# Patient Record
Sex: Female | Born: 1980 | Race: Black or African American | Hispanic: No | Marital: Single | Smoking: Current every day smoker
Health system: Southern US, Community
[De-identification: ages and names within clinical notes are randomized; demographics above are authoritative.]

## PROBLEM LIST (undated history)

## (undated) DIAGNOSIS — G43909 Migraine, unspecified, not intractable, without status migrainosus: Secondary | ICD-10-CM

## (undated) DIAGNOSIS — J189 Pneumonia, unspecified organism: Secondary | ICD-10-CM

## (undated) DIAGNOSIS — F32A Depression, unspecified: Secondary | ICD-10-CM

## (undated) DIAGNOSIS — C801 Malignant (primary) neoplasm, unspecified: Secondary | ICD-10-CM

## (undated) DIAGNOSIS — F419 Anxiety disorder, unspecified: Secondary | ICD-10-CM

## (undated) DIAGNOSIS — I10 Essential (primary) hypertension: Secondary | ICD-10-CM

## (undated) DIAGNOSIS — L42 Pityriasis rosea: Secondary | ICD-10-CM

## (undated) HISTORY — DX: Anxiety disorder, unspecified: F41.9

## (undated) HISTORY — PX: OTHER SURGICAL HISTORY: SHX169

## (undated) HISTORY — PX: TUBAL LIGATION: SHX77

## (undated) HISTORY — PX: ABDOMINAL HYSTERECTOMY: SHX81

## (undated) HISTORY — PX: UPPER GI ENDOSCOPY: SHX6162

## (undated) HISTORY — DX: Depression, unspecified: F32.A

---

## 2009-08-06 DIAGNOSIS — M5020 Other cervical disc displacement, unspecified cervical region: Secondary | ICD-10-CM

## 2009-08-06 HISTORY — DX: Other cervical disc displacement, unspecified cervical region: M50.20

## 2013-03-04 ENCOUNTER — Emergency Department (HOSPITAL_COMMUNITY)
Admission: EM | Admit: 2013-03-04 | Discharge: 2013-03-04 | Disposition: A | Discharge status: Home / Self Care | Payer: Medicaid Other | Attending: Emergency Medicine | Admitting: Emergency Medicine

## 2013-03-04 ENCOUNTER — Encounter (HOSPITAL_COMMUNITY): Payer: Self-pay | Admitting: Emergency Medicine

## 2013-03-04 DIAGNOSIS — F17200 Nicotine dependence, unspecified, uncomplicated: Secondary | ICD-10-CM | POA: Insufficient documentation

## 2013-03-04 DIAGNOSIS — Z880 Allergy status to penicillin: Secondary | ICD-10-CM | POA: Insufficient documentation

## 2013-03-04 DIAGNOSIS — Z8679 Personal history of other diseases of the circulatory system: Secondary | ICD-10-CM | POA: Insufficient documentation

## 2013-03-04 DIAGNOSIS — T6391XA Toxic effect of contact with unspecified venomous animal, accidental (unintentional), initial encounter: Secondary | ICD-10-CM | POA: Insufficient documentation

## 2013-03-04 DIAGNOSIS — Y929 Unspecified place or not applicable: Secondary | ICD-10-CM | POA: Insufficient documentation

## 2013-03-04 DIAGNOSIS — I10 Essential (primary) hypertension: Secondary | ICD-10-CM | POA: Insufficient documentation

## 2013-03-04 DIAGNOSIS — R21 Rash and other nonspecific skin eruption: Secondary | ICD-10-CM | POA: Insufficient documentation

## 2013-03-04 DIAGNOSIS — Y9389 Activity, other specified: Secondary | ICD-10-CM | POA: Insufficient documentation

## 2013-03-04 DIAGNOSIS — T63461A Toxic effect of venom of wasps, accidental (unintentional), initial encounter: Secondary | ICD-10-CM | POA: Insufficient documentation

## 2013-03-04 HISTORY — DX: Essential (primary) hypertension: I10

## 2013-03-04 HISTORY — DX: Migraine, unspecified, not intractable, without status migrainosus: G43.909

## 2013-03-04 NOTE — ED Notes (Signed)
Pt reports being stung in right upper arm by a bee around 1600 this afternoon. Took 2 benadryl tablets soon after being stung. Pt states swelling has gone down somewhat since taking the benadryl but that arm is still inflamed and painful. Right upper arm swollen, painful to the touch. Pt rates pain 8/10 at this time.

## 2013-03-04 NOTE — ED Notes (Signed)
PT. REPORTS RIGHT UPPER ARM PAIN / SWELLING - STUNG BY A BEE THIS AFTERNOON , RESPIRATIONS UNLABORED / AIRWAY INTACT , PT. TOOK 2 BENADRYL AND 2 ALEEVE TABS WITH NO RELIEF.

## 2013-03-04 NOTE — ED Provider Notes (Signed)
CSN: [id number]     Arrival date & time 03/04/13  1935 History  This chart was scribed for Dierdre Forth, PA-C, working with Loren Racer, MD, by Ardelia Mems ED Scribe. This patient was seen in room TR06C/TR06C and the patient's care was started at 8:57 PM.   First MD Initiated Contact with Patient 03/04/13 2055     Chief Complaint  Patient presents with  . Insect Bite    The history is provided by the patient. No language interpreter was used.   HPI Comments: Regina Cantu is a 32 y.o. female with a history of hypertension who presents to the Emergency Department complaining of right upper arm swelling with associated constant, mild pain onset after a bee sting that occurred about 5 hours ago. She states that she took 2 benadryl, 2 aleve and applied ice with moderate relief. She states that she maintains tenderness in her arm, and she states that her swelling has been gradually improving. She states that as a child she was stung by a bee on the hand, and she states that her only symptom was hand swelling. She denies rash anywhere else on her body, trouble swallowing, SOB, wheezing or any other symptoms. She is a current every day smoker and an occasional alcohol user.  PCP- Dr. Jovita Gamma   Past Medical History  Diagnosis Date  . Hypertension   . Migraine    Past Surgical History  Procedure Laterality Date  . Abdominal hysterectomy    . Ovarian cancer     No family history on file. History  Substance Use Topics  . Smoking status: Current Every Day Smoker  . Smokeless tobacco: Not on file  . Alcohol Use: Yes   OB History   Grav Para Term Preterm Abortions TAB SAB Ect Mult Living                 Review of Systems  Constitutional: Negative for fever, diaphoresis, appetite change, fatigue and unexpected weight change.  HENT: Negative for mouth sores, trouble swallowing and neck stiffness.   Eyes: Negative for visual disturbance.  Respiratory: Negative  for cough, chest tightness, shortness of breath and wheezing.   Cardiovascular: Negative for chest pain.  Gastrointestinal: Negative for nausea, vomiting, abdominal pain, diarrhea and constipation.  Endocrine: Negative for polydipsia, polyphagia and polyuria.  Genitourinary: Negative for dysuria, urgency, frequency and hematuria.  Musculoskeletal: Negative for back pain.  Skin: Positive for rash (localized, right arm).  Allergic/Immunologic: Negative for immunocompromised state.  Neurological: Negative for syncope, light-headedness and headaches.  Hematological: Does not bruise/bleed easily.  Psychiatric/Behavioral: Negative for sleep disturbance. The patient is not nervous/anxious.   All other systems reviewed and are negative.    Allergies  Penicillins  Home Medications   Current Outpatient Rx  Name  Route  Sig  Dispense  Refill  . diphenhydrAMINE (BENADRYL) 25 MG tablet   Oral   Take 50 mg by mouth every 6 (six) hours as needed for itching.         . naproxen sodium (ANAPROX) 220 MG tablet   Oral   Take 440 mg by mouth as needed (pain).          Triage Vitals: BP 146/98  Pulse 94  Temp(Src) 98.4 F (36.9 C) (Oral)  Resp 14  SpO2 97%  Physical Exam  Nursing note and vitals reviewed. Constitutional: She appears well-developed and well-nourished. No distress.  Awake, alert, nontoxic appearance  HENT:  Head: Normocephalic and atraumatic.  Mouth/Throat: Oropharynx  is clear and moist. No oropharyngeal exudate.  No swelling of the oropharynx  Eyes: Conjunctivae are normal. No scleral icterus.  Neck: Normal range of motion. Neck supple.  Cardiovascular: Normal rate, regular rhythm, S1 normal, S2 normal, normal heart sounds and intact distal pulses.   Pulses:      Radial pulses are 2+ on the right side, and 2+ on the left side.  Capillary refill < 3 sec No tachycardia  Pulmonary/Chest: Effort normal and breath sounds normal. No respiratory distress. She has no  decreased breath sounds. She has no wheezes. She has no rhonchi. She has no rales. She exhibits no tenderness.  No stridor.  Abdominal: Soft. Bowel sounds are normal. She exhibits no mass. There is no tenderness. There is no rebound and no guarding.  Musculoskeletal: Normal range of motion. She exhibits no edema.  Neurological: She is alert.  Speech is clear and goal oriented Moves extremities without ataxia  Skin: Skin is warm and dry. She is not diaphoretic.  2 inch x 2 inch area of erythema, swelling and tenderness to palpation of the distal right upper arm, with a puncture wound in the center. No stinger is retained. No rash or hives noted on other portions of the body.  Psychiatric: She has a normal mood and affect.    ED Course   Procedures (including critical care time)  DIAGNOSTIC STUDIES: Oxygen Saturation is 97% on RA, normal by my interpretation.    COORDINATION OF CARE: 9:14 PM- Pt advised of plan for treatment and pt agrees.    Labs Reviewed - No data to display  No results found.  1. Insect sting, initial encounter     MDM  Areal Omer Jack presents after bee sting.  Pt with only local reaction and without systemic symptoms (including wheezing, hypotension, stridor, tachycardia).  Pt already administered home benadryl with moderate relief.  Patient is hemodynamically stable, in no respiratory distress, and denies the feeling of throat closing. Pt has been advised to take OTC benadryl & return to the ED if they have a mod-severe allergic rxn (s/s including throat closing, difficulty breathing, swelling of lips face or tongue). Pt is to follow up with their PCP. Pt is agreeable with plan & verbalizes understanding.   Dahlia Client Julian Askin, PA-C 03/04/13 2221

## 2013-03-05 NOTE — ED Provider Notes (Signed)
Medical screening examination/treatment/procedure(s) were performed by non-physician practitioner and as supervising physician I was immediately available for consultation/collaboration.   Jordan Caraveo, MD 03/05/13 1753 

## 2017-01-06 ENCOUNTER — Encounter (HOSPITAL_COMMUNITY): Payer: Self-pay | Admitting: Emergency Medicine

## 2017-01-06 DIAGNOSIS — F17200 Nicotine dependence, unspecified, uncomplicated: Secondary | ICD-10-CM | POA: Insufficient documentation

## 2017-01-06 DIAGNOSIS — Y939 Activity, unspecified: Secondary | ICD-10-CM | POA: Insufficient documentation

## 2017-01-06 DIAGNOSIS — I10 Essential (primary) hypertension: Secondary | ICD-10-CM | POA: Insufficient documentation

## 2017-01-06 DIAGNOSIS — S29011A Strain of muscle and tendon of front wall of thorax, initial encounter: Secondary | ICD-10-CM | POA: Insufficient documentation

## 2017-01-06 DIAGNOSIS — Y999 Unspecified external cause status: Secondary | ICD-10-CM | POA: Insufficient documentation

## 2017-01-06 DIAGNOSIS — Y33XXXA Other specified events, undetermined intent, initial encounter: Secondary | ICD-10-CM | POA: Insufficient documentation

## 2017-01-06 DIAGNOSIS — Y929 Unspecified place or not applicable: Secondary | ICD-10-CM | POA: Insufficient documentation

## 2017-01-06 NOTE — ED Triage Notes (Signed)
Pt reports chest/rib pain for over a week.  Reports it sometime hurts when she takes a deep breath or moves but denies any sob, dizziness or fevers.

## 2017-01-07 ENCOUNTER — Emergency Department (HOSPITAL_COMMUNITY)
Admission: EM | Admit: 2017-01-07 | Discharge: 2017-01-07 | Disposition: A | Discharge status: Home / Self Care | Payer: Medicaid Other | Attending: Emergency Medicine | Admitting: Emergency Medicine

## 2017-01-07 ENCOUNTER — Encounter (HOSPITAL_COMMUNITY): Payer: Self-pay | Admitting: Emergency Medicine

## 2017-01-07 ENCOUNTER — Emergency Department (HOSPITAL_COMMUNITY): Payer: Medicaid Other

## 2017-01-07 DIAGNOSIS — S29011A Strain of muscle and tendon of front wall of thorax, initial encounter: Secondary | ICD-10-CM

## 2017-01-07 HISTORY — DX: Pityriasis rosea: L42

## 2017-01-07 LAB — CBC
HCT: 41 % (ref 36.0–46.0)
Hemoglobin: 13.6 g/dL (ref 12.0–15.0)
MCH: 27.9 pg (ref 26.0–34.0)
MCHC: 33.2 g/dL (ref 30.0–36.0)
MCV: 84.2 fL (ref 78.0–100.0)
PLATELETS: 378 10*3/uL (ref 150–400)
RBC: 4.87 MIL/uL (ref 3.87–5.11)
RDW: 12.5 % (ref 11.5–15.5)
WBC: 9.3 10*3/uL (ref 4.0–10.5)

## 2017-01-07 LAB — BASIC METABOLIC PANEL
Anion gap: 9 (ref 5–15)
BUN: 19 mg/dL (ref 6–20)
CALCIUM: 9.1 mg/dL (ref 8.9–10.3)
CHLORIDE: 104 mmol/L (ref 101–111)
CO2: 23 mmol/L (ref 22–32)
Creatinine, Ser: 0.98 mg/dL (ref 0.44–1.00)
GFR calc Af Amer: >60 mL/min (ref >60)
GFR calc non Af Amer: >60 mL/min (ref >60)
Glucose, Bld: 139 mg/dL — ABNORMAL HIGH (ref 65–99)
Potassium: 3.6 mmol/L (ref 3.5–5.1)
SODIUM: 136 mmol/L (ref 135–145)

## 2017-01-07 LAB — I-STAT TROPONIN, ED: TROPONIN I, POC: 0 ng/mL (ref 0.00–0.08)

## 2017-01-07 MED ORDER — METHOCARBAMOL 500 MG PO TABS
750.0000 mg | ORAL_TABLET | Freq: Once | ORAL | Status: AC
  Administered 2017-01-07: 750 mg via ORAL
  Filled 2017-01-07: qty 2

## 2017-01-07 MED ORDER — METHOCARBAMOL 500 MG PO TABS: 500.0000 mg | ORAL_TABLET | Freq: Two times a day (BID) | ORAL | 0 refills | Status: DC

## 2017-01-07 MED ORDER — TRAMADOL HCL 50 MG PO TABS
50.0000 mg | ORAL_TABLET | Freq: Once | ORAL | Status: AC
  Administered 2017-01-07: 50 mg via ORAL
  Filled 2017-01-07: qty 1

## 2017-01-07 MED ORDER — TRAMADOL HCL 50 MG PO TABS: 50.0000 mg | ORAL_TABLET | Freq: Four times a day (QID) | ORAL | 0 refills | Status: DC | PRN

## 2017-01-07 NOTE — Discharge Instructions (Signed)
You have been given prescriptions for muscle relaxer and pain relief.  Please take these as directed.  If you continue to have pain or you develop new or worsening symptoms please return for further evaluation is needed

## 2017-01-07 NOTE — ED Provider Notes (Signed)
[facility] DEPT Provider Note   CSN: [id number] Arrival date & time: 01/06/17  2310     History   Chief Complaint Chief Complaint  Patient presents with  . Chest Pain    HPI Regina Cantu is a 36 y.o. female.  The past 2 weeks.  Patient has had right-sided rib pain.  She does not remember a distinct injury.  She denies any fever, cough, recent illness.  She said certain movements increase the pain.  She's been taking Advil and using salon pot patches without any relief.  She has not seen her doctor during this two-week period. She works as a Chief Strategy Officer.       Past Medical History:  Diagnosis Date  . Hypertension   . Migraine   . Pityriasis rosea     There are no active problems to display for this patient.   Past Surgical History:  Procedure Laterality Date  . ABDOMINAL HYSTERECTOMY    . OVARIAN CANCER      OB History    No data available       Home Medications    Prior to Admission medications   Not on File    Family History No family history on file.  Social History Social History  Substance Use Topics  . Smoking status: Current Every Day Smoker  . Smokeless tobacco: Never Used  . Alcohol use Yes     Allergies   Penicillins   Review of Systems Review of Systems  Constitutional: Negative for fever.  Respiratory: Negative for cough and shortness of breath.   Cardiovascular: Positive for chest pain.  Gastrointestinal: Negative for abdominal pain and nausea.  Skin: Negative for rash and wound.  All other systems reviewed and are negative.    Physical Exam Updated Vital Signs BP (!) 150/88 (BP Location: Right Arm)   Pulse 78   Temp 98.9 F (37.2 C) (Oral)   Resp 18   SpO2 99%   Physical Exam  Constitutional: She appears well-developed and well-nourished.  HENT:  Head: Normocephalic.  Eyes: Pupils are equal, round, and reactive to light.  Neck: Normal range of motion.  Cardiovascular: Normal rate.     Pulmonary/Chest: Effort normal and breath sounds normal. She has no wheezes. She has no rales. Chest wall is not dull to percussion. She exhibits tenderness. She exhibits no crepitus, no edema and no swelling.    Nursing note and vitals reviewed.    ED Treatments / Results  Labs (all labs ordered are listed, but only abnormal results are displayed) Labs Reviewed  BASIC METABOLIC PANEL - Abnormal; Notable for the following:       Result Value   Glucose, Bld 139 (*)    All other components within normal limits  CBC  I-STAT TROPOININ, ED    EKG  EKG Interpretation None       Radiology Dg Chest 2 View  Result Date: 01/07/2017 CLINICAL DATA:  Right-sided chest pain tonight. EXAM: CHEST  2 VIEW COMPARISON:  None. FINDINGS: The cardiomediastinal contours are normal. The lungs are clear. Pulmonary vasculature is normal. No consolidation, pleural effusion, or pneumothorax. No acute osseous abnormalities are seen. IMPRESSION: No acute abnormality. Electronically Signed   By: Jeb Levering M.D.   On: 01/07/2017 00:45    Procedures Procedures (including critical care time)  Medications Ordered in ED Medications  methocarbamol (ROBAXIN) tablet 750 mg (not administered)  traMADol (ULTRAM) tablet 50 mg (not administered)     Initial Impression / Assessment  and Plan / ED Course  I have reviewed the triage vital signs and the nursing notes.  Pertinent labs & imaging results that were available during my care of the patient were reviewed by me and considered in my medical decision making (see chart for details).    Review of patient's labs, EKG and chest x-ray within normal parameters feel this patient's discomfort is muscular in nature.  She will be started on Robaxin and Ultram for the next several days.    Final Clinical Impressions(s) / ED Diagnoses   Final diagnoses:  None    New Prescriptions New Prescriptions   No medications on file     Junius Creamer,  NP 01/07/17 2767    Merryl Hacker, MD 01/07/17 [phone]

## 2017-01-07 NOTE — ED Notes (Signed)
Pt c/o R posterior rib area pain radiating to R axillary area x 2 weeks. Pt reports having a "bump" to area that husband has attempted to clean with alcohol.  Indurated area noted with a few small raised areas. Small dry area noted to center, no drainage noted. Pt reports severe pain, no hx of chicken pox, no fever.

## 2018-04-17 IMAGING — CR DG CHEST 2V
2 series · 2 of 2 positions shown · non-contrast
Comparison: None.

CLINICAL DATA: Right-sided chest pain tonight.

EXAM:
CHEST  2 VIEW

[chest pa]
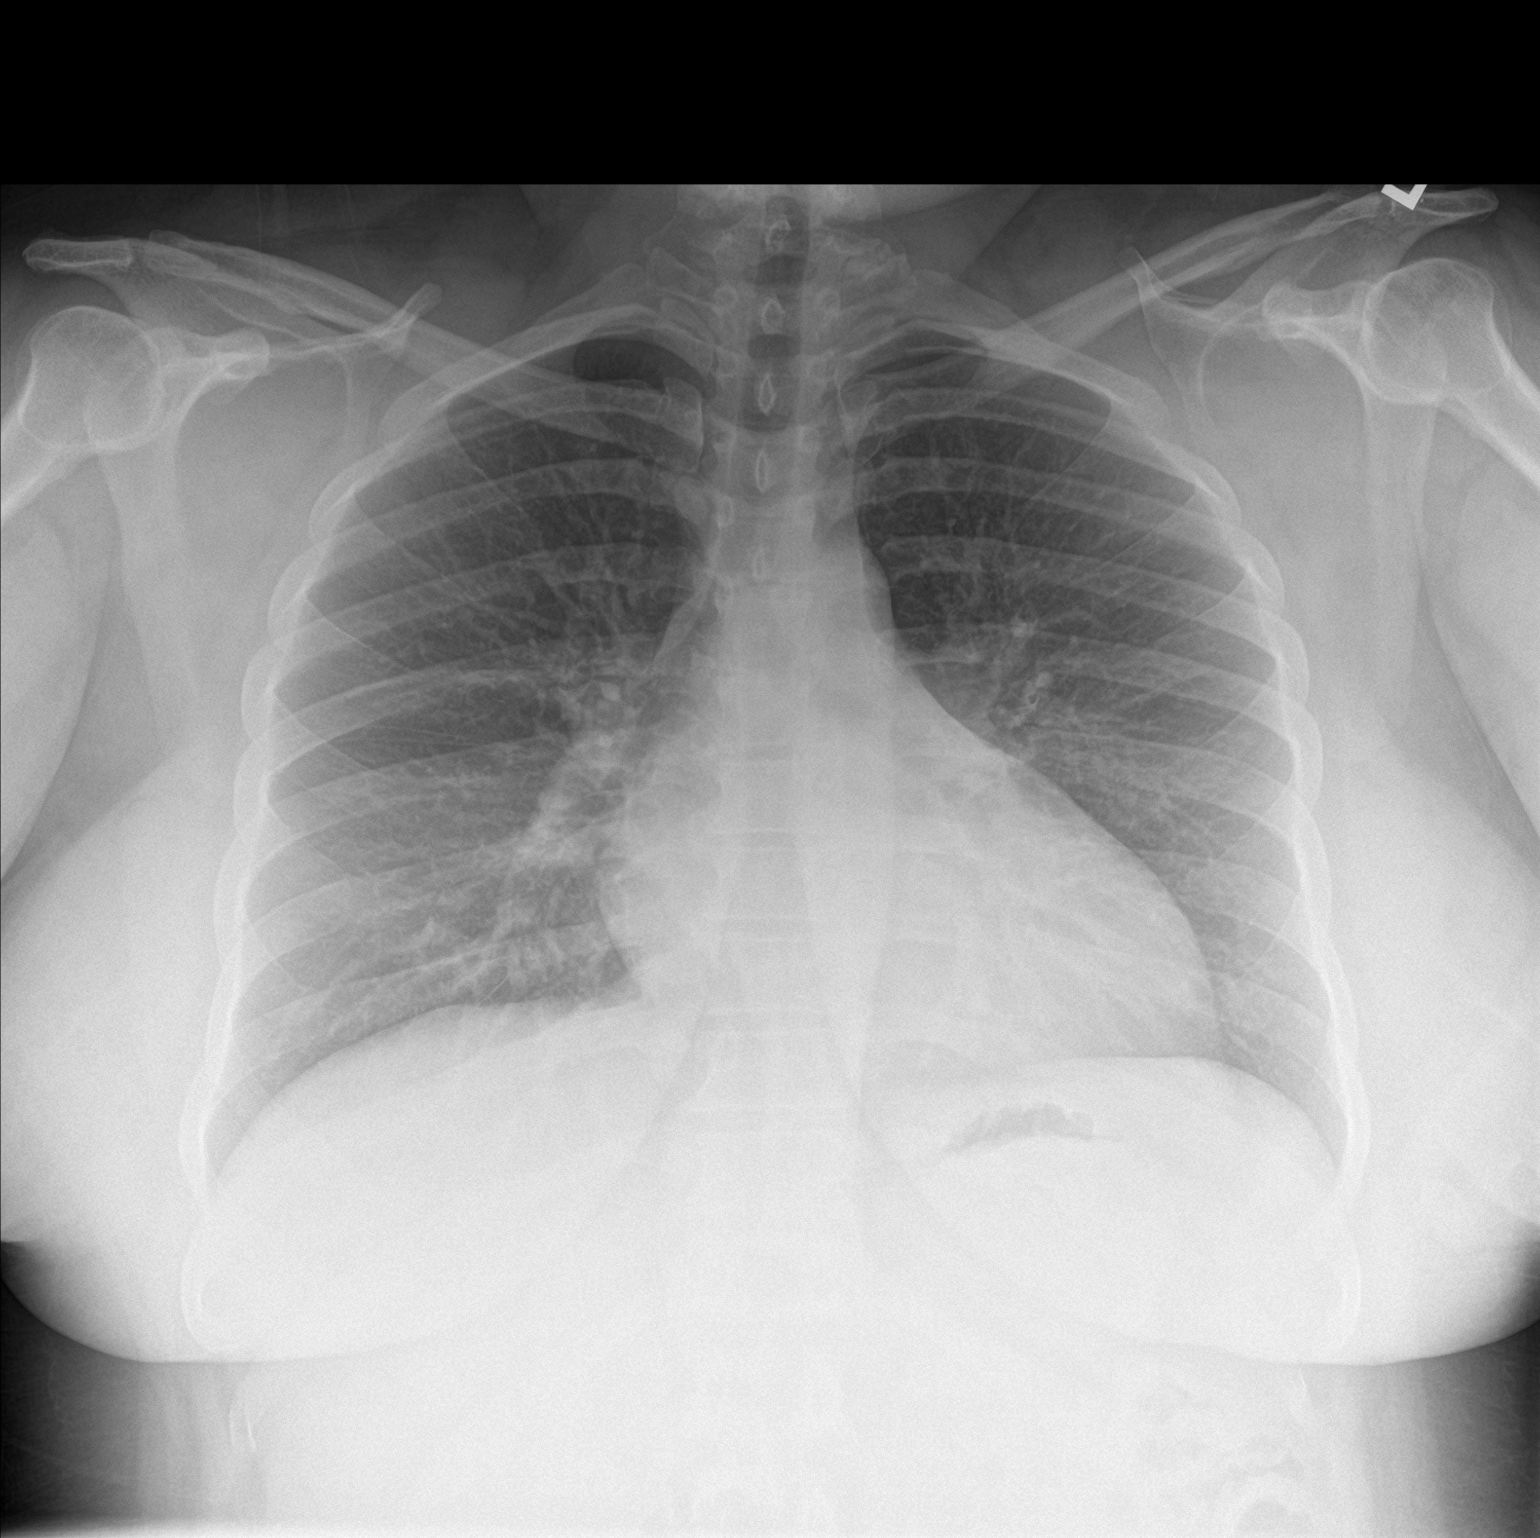

[chest lat]
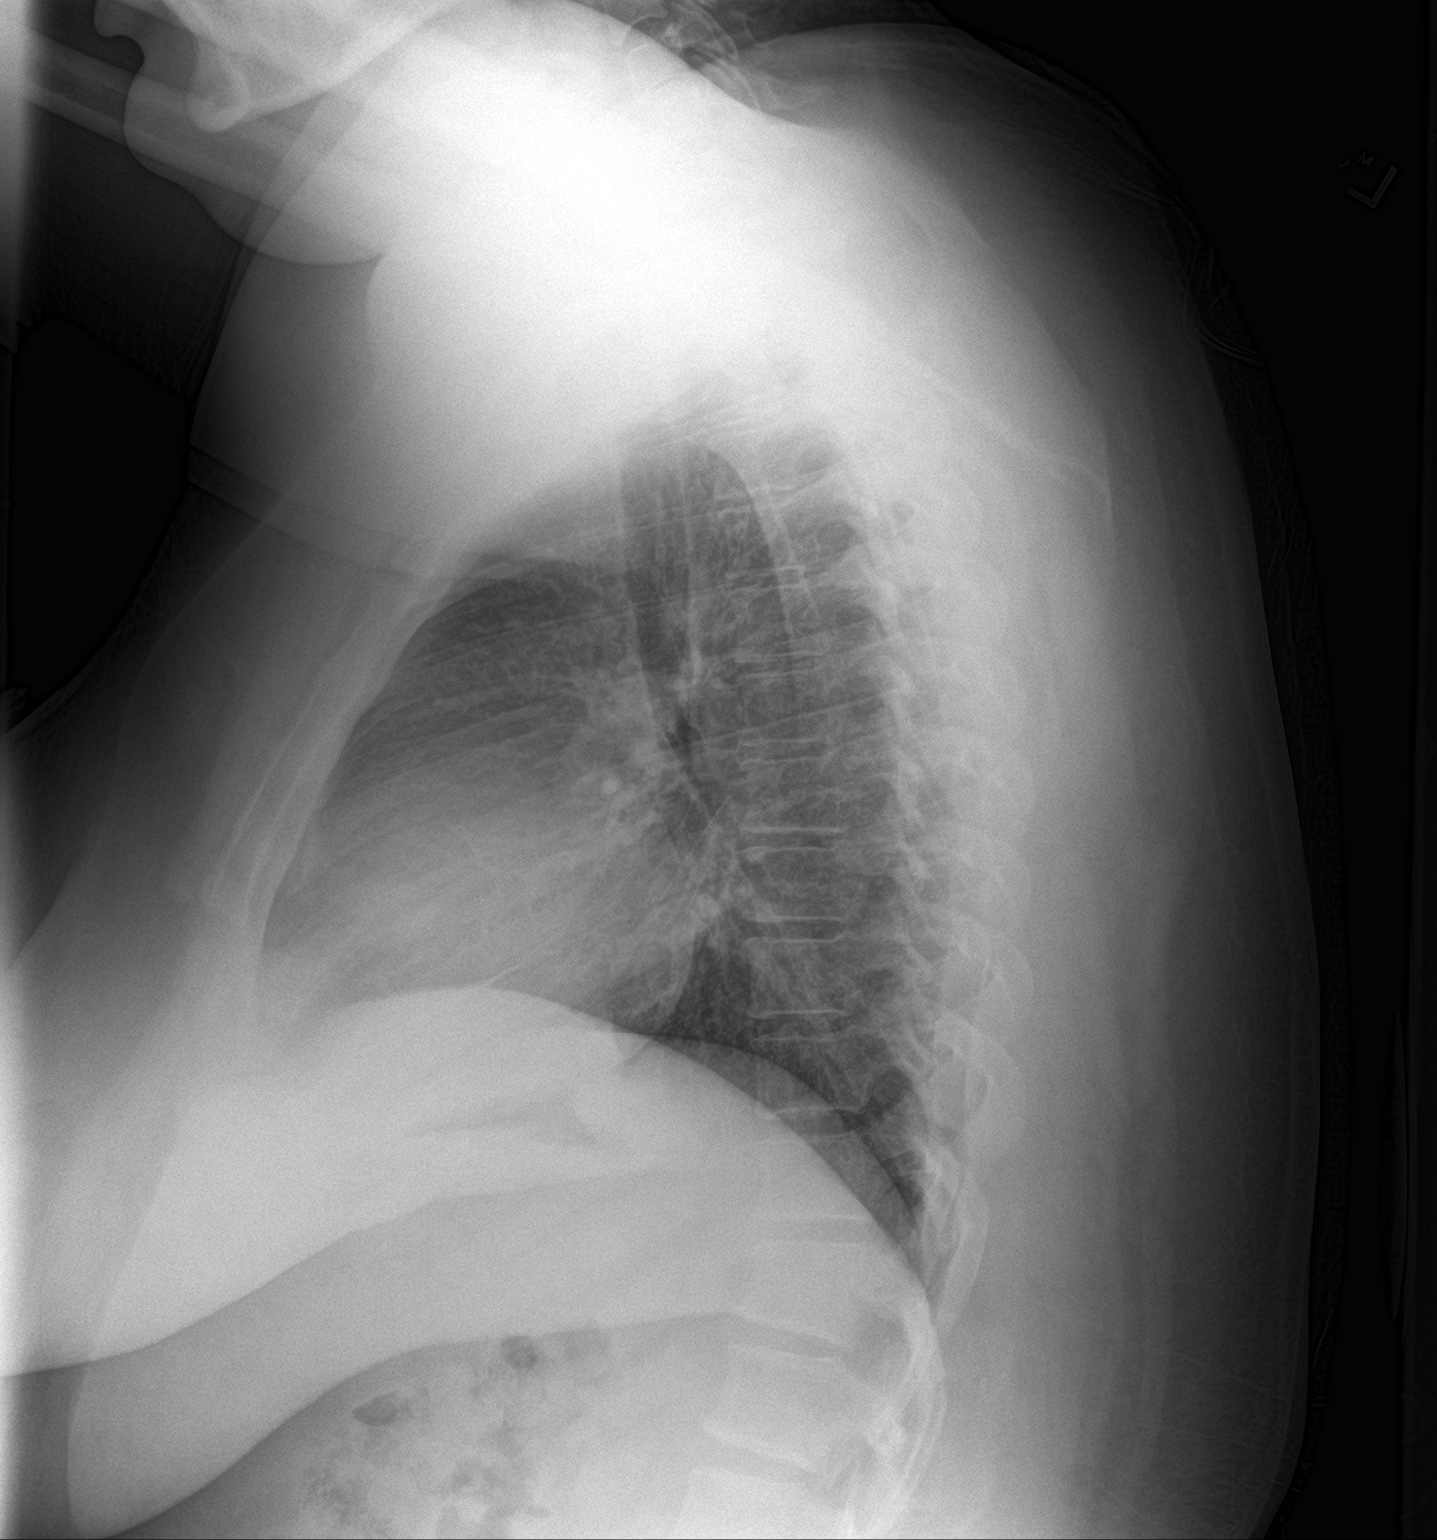

[2 of 2 positions shown; findings below may reference images not displayed]

FINDINGS: The cardiomediastinal contours are normal. The lungs are clear.
Pulmonary vasculature is normal. No consolidation, pleural effusion,
or pneumothorax. No acute osseous abnormalities are seen.
IMPRESSION: No acute abnormality.

## 2018-09-16 ENCOUNTER — Encounter (HOSPITAL_COMMUNITY): Payer: Self-pay

## 2018-09-16 ENCOUNTER — Ambulatory Visit (HOSPITAL_COMMUNITY)
Admission: EM | Admit: 2018-09-16 | Discharge: 2018-09-16 | Disposition: A | Discharge status: Home / Self Care | Payer: Self-pay | Attending: Family Medicine | Admitting: Family Medicine

## 2018-09-16 ENCOUNTER — Other Ambulatory Visit: Payer: Self-pay

## 2018-09-16 DIAGNOSIS — L0291 Cutaneous abscess, unspecified: Secondary | ICD-10-CM

## 2018-09-16 MED ORDER — SULFAMETHOXAZOLE-TRIMETHOPRIM 800-160 MG PO TABS: 1.0000 | ORAL_TABLET | Freq: Two times a day (BID) | ORAL | 0 refills | Status: DC

## 2018-09-16 MED ORDER — LIDOCAINE HCL 2 % IJ SOLN
INTRAMUSCULAR | Status: AC
  Filled 2018-09-16: qty 20

## 2018-09-16 MED ORDER — HYDROCODONE-ACETAMINOPHEN 5-325 MG PO TABS: 1.0000 | ORAL_TABLET | Freq: Four times a day (QID) | ORAL | 0 refills | Status: DC | PRN

## 2018-09-16 NOTE — ED Triage Notes (Signed)
Pt cc she has a abscess on her inner thigh near her buttocks. X 4 days

## 2018-09-16 NOTE — Discharge Instructions (Signed)
You have had an abscess drained today and you may have had packing placed in the wound to help the abscess continue to drain at home. If packing was placed, please do not remove it. You may shower with the packing in place. Let the soapy water clean your wound. Do not scrub it. Keep your wound covered. Follow up at the Urgent Care in 2 days for a wound check and/or packing removal. Return to the Urgent Care immediately if you develop any of the following symptoms: fever, Increased redness or swelling around where your abscess was, increased pain, or generalized weakness or vomiting.  Be aware, pain medications may cause drowsiness. Please do not drive, operate heavy machinery or make important decisions while on this medication, it can cloud your judgement.

## 2018-09-17 NOTE — ED Provider Notes (Signed)
[facility]   [id number] 09/16/18 Arrival Time: 7425  ASSESSMENT & PLAN:  1. Abscess     Incision and Drainage Procedure Note  Anesthesia: 2% plain lidocaine  Procedure Details  The procedure, risks and complications have been discussed in detail (including but not limited to pain and bleeding) with the patient.  The skin induration was prepped and draped in the usual fashion. After adequate local anesthesia, I&D with a #11 blade was performed on the right inner upper thigh near buttock with copious purulent drainage. Abscess cavity explored and evacuated. Loculations broken up with a curved hemostat as best as possible given patient discomfort.  EBL: minimal Drains: none Packing: placed Condition: Tolerated procedure well Complications: none.  Meds ordered this encounter  Medications  . HYDROcodone-acetaminophen (NORCO/VICODIN) 5-325 MG tablet    Sig: Take 1 tablet by mouth every 6 (six) hours as needed for moderate pain or severe pain.    Dispense:  10 tablet    Refill:  0  . sulfamethoxazole-trimethoprim (BACTRIM DS,SEPTRA DS) 800-160 MG tablet    Sig: Take 1 tablet by mouth 2 (two) times daily for 7 days.    Dispense:  14 tablet    Refill:  0    Wound care instructions discussed and given in written format. To return in 48 hours for wound check.  Finish all antibiotics. OTC analgesics as needed.  [place] Controlled Substances Registry consulted for this patient. I feel the risk/benefit ratio today is favorable for proceeding with this prescription for a controlled substance. Medication sedation precautions given.  Reviewed expectations re: course of current medical issues. Questions answered. Outlined signs and symptoms indicating need for more acute intervention. Patient verbalized understanding. After Visit Summary given.   SUBJECTIVE:  Lamica Lakaya Cantu is a 38 y.o. female who presents with a possible infection of her inner upper right thigh. Onset  gradual, approximately several days ago without active drainage and without active bleeding. Symptoms have gradually worsened since beginning. Fever: absent. OTC/home treatment: none. Pain with ambulation.  ROS: As per HPI.  OBJECTIVE:  Vitals:   09/16/18 1723 09/16/18 1727  BP: (!) 154/87   Pulse: 100   Resp: 18   Temp: 97.8 F (36.6 C)   TempSrc: Tympanic   SpO2: 100%   Weight:  113.4 kg    General appearance: alert; no distress Skin: 3x4 cm induration of her right inner upper thigh at buttock with surrounding skin thickening; very tender to touch; no active drainage or bleeding FROM of RLE at hip. Normal gait. Psychological: alert and cooperative; normal mood and affect  Allergies  Allergen Reactions  . Penicillins Hives, Nausea And Vomiting and Rash    Past Medical History:  Diagnosis Date  . Hypertension   . Migraine   . Pityriasis rosea    Social History   Socioeconomic History  . Marital status: Single    Spouse name: Not on file  . Number of children: Not on file  . Years of education: Not on file  . Highest education level: Not on file  Occupational History  . Not on file  Social Needs  . Financial resource strain: Not on file  . Food insecurity:    Worry: Not on file    Inability: Not on file  . Transportation needs:    Medical: Not on file    Non-medical: Not on file  Tobacco Use  . Smoking status: Current Every Day Smoker  . Smokeless tobacco: Never Used  Substance and Sexual  Activity  . Alcohol use: Yes  . Drug use: No  . Sexual activity: Not on file  Lifestyle  . Physical activity:    Days per week: Not on file    Minutes per session: Not on file  . Stress: Not on file  Relationships  . Social connections:    Talks on phone: Not on file    Gets together: Not on file    Attends religious service: Not on file    Active member of club or organization: Not on file    Attends meetings of clubs or organizations: Not on file     Relationship status: Not on file  Other Topics Concern  . Not on file  Social History Narrative  . Not on file   History reviewed. No pertinent family history. Past Surgical History:  Procedure Laterality Date  . ABDOMINAL HYSTERECTOMY    . OVARIAN CANCER             Vanessa Kick, MD 09/17/18 [phone]

## 2018-09-18 ENCOUNTER — Encounter (HOSPITAL_COMMUNITY): Payer: Self-pay | Admitting: Emergency Medicine

## 2018-09-18 ENCOUNTER — Other Ambulatory Visit: Payer: Self-pay

## 2018-09-18 ENCOUNTER — Ambulatory Visit (HOSPITAL_COMMUNITY)
Admission: EM | Admit: 2018-09-18 | Discharge: 2018-09-18 | Disposition: A | Discharge status: Home / Self Care | Payer: Self-pay | Attending: Family Medicine | Admitting: Family Medicine

## 2018-09-18 DIAGNOSIS — L02214 Cutaneous abscess of groin: Secondary | ICD-10-CM

## 2018-09-18 MED ORDER — DOXYCYCLINE HYCLATE 100 MG PO TABS: 100.0000 mg | ORAL_TABLET | Freq: Two times a day (BID) | ORAL | 0 refills | Status: DC

## 2018-09-18 NOTE — Discharge Instructions (Addendum)
Wash/soak in hot soapy water as much as possible  Switch antibiotic  Return if pain not better by Saturday

## 2018-09-18 NOTE — ED Provider Notes (Signed)
[facility]    CSN: [id number] Arrival date & time: 09/18/18  1537     History   Chief Complaint Chief Complaint  Patient presents with  . Follow-up    HPI Regina Cantu is a 38 y.o. female.   This is a 38 year old woman who had an abscess drained in her groin 2 days ago.  She still having quite a bit of pain despite having taken her antibiotics as directed.     Past Medical History:  Diagnosis Date  . Hypertension   . Migraine   . Pityriasis rosea     There are no active problems to display for this patient.   Past Surgical History:  Procedure Laterality Date  . ABDOMINAL HYSTERECTOMY    . OVARIAN CANCER      OB History   No obstetric history on file.      Home Medications    Prior to Admission medications   Medication Sig Start Date End Date Taking? Authorizing Provider  doxycycline (VIBRA-TABS) 100 MG tablet Take 1 tablet (100 mg total) by mouth 2 (two) times daily. 09/18/18   Robyn Haber, MD    Family History History reviewed. No pertinent family history.  Social History Social History   Tobacco Use  . Smoking status: Current Every Day Smoker  . Smokeless tobacco: Never Used  Substance Use Topics  . Alcohol use: Yes  . Drug use: No     Allergies   Penicillins   Review of Systems Review of Systems   Physical Exam Triage Vital Signs ED Triage Vitals  Enc Vitals Group     BP 09/18/18 1647 (!) 120/51     Pulse Rate 09/18/18 1647 93     Resp 09/18/18 1647 18     Temp 09/18/18 1647 98 F (36.7 C)     Temp Source 09/18/18 1647 Temporal     SpO2 09/18/18 1647 99 %     Weight --      Height --      Head Circumference --      Peak Flow --      Pain Score 09/18/18 1645 7     Pain Loc --      Pain Edu? --      Excl. in [place]? --    No data found.  Updated Vital Signs BP (!) 120/51 (BP Location: Right Arm)   Pulse 93   Temp 98 F (36.7 C) (Temporal)   Resp 18   SpO2 99%    Physical Exam Vitals signs  and nursing note reviewed.  Constitutional:      Appearance: Normal appearance. She is obese. She is not ill-appearing.  Pulmonary:     Effort: Pulmonary effort is normal.  Skin:    Comments: 2 cm area of induration anterior to I&D site.  Exquisitely tender.  Open stab wound.  No drainage.  Neurological:     Mental Status: She is alert.      UC Treatments / Results  Labs (all labs ordered are listed, but only abnormal results are displayed) Labs Reviewed - No data to display  EKG None  Radiology No results found.  Procedures Procedures (including critical care time)  Medications Ordered in UC Medications - No data to display  Initial Impression / Assessment and Plan / UC Course  I have reviewed the triage vital signs and the nursing notes.  Pertinent labs & imaging results that were available during my care of the patient were reviewed  by me and considered in my medical decision making (see chart for details).    Final Clinical Impressions(s) / UC Diagnoses   Final diagnoses:  Abscess of right groin     Discharge Instructions     Wash/soak in hot soapy water as much as possible  Switch antibiotic  Return if pain not better by Saturday    ED Prescriptions    Medication Sig Dispense Auth. Provider   doxycycline (VIBRA-TABS) 100 MG tablet Take 1 tablet (100 mg total) by mouth 2 (two) times daily. 20 tablet Robyn Haber, MD     Controlled Substance Prescriptions [place] Controlled Substance Registry consulted? Not Applicable   Robyn Haber, MD 09/18/18 1710

## 2018-09-18 NOTE — ED Triage Notes (Signed)
Seen 2/11 for abscess.  Patient continues to have pain, but is slightly improved.  Remains too tender to touch.

## 2018-09-24 ENCOUNTER — Emergency Department (HOSPITAL_COMMUNITY): Payer: Medicaid Other

## 2018-09-24 ENCOUNTER — Observation Stay (HOSPITAL_COMMUNITY)
Admission: EM | Admit: 2018-09-24 | Discharge: 2018-09-25 | Disposition: A | Discharge status: Home / Self Care | Payer: Medicaid Other | Attending: Internal Medicine | Admitting: Internal Medicine

## 2018-09-24 ENCOUNTER — Encounter (HOSPITAL_COMMUNITY): Payer: Self-pay | Admitting: Emergency Medicine

## 2018-09-24 ENCOUNTER — Other Ambulatory Visit: Payer: Self-pay

## 2018-09-24 DIAGNOSIS — R739 Hyperglycemia, unspecified: Secondary | ICD-10-CM | POA: Diagnosis not present

## 2018-09-24 DIAGNOSIS — R102 Pelvic and perineal pain: Secondary | ICD-10-CM | POA: Diagnosis not present

## 2018-09-24 DIAGNOSIS — B9689 Other specified bacterial agents as the cause of diseases classified elsewhere: Secondary | ICD-10-CM | POA: Insufficient documentation

## 2018-09-24 DIAGNOSIS — I10 Essential (primary) hypertension: Secondary | ICD-10-CM | POA: Insufficient documentation

## 2018-09-24 DIAGNOSIS — Z79899 Other long term (current) drug therapy: Secondary | ICD-10-CM | POA: Insufficient documentation

## 2018-09-24 DIAGNOSIS — R9431 Abnormal electrocardiogram [ECG] [EKG]: Secondary | ICD-10-CM | POA: Insufficient documentation

## 2018-09-24 DIAGNOSIS — Z90710 Acquired absence of both cervix and uterus: Secondary | ICD-10-CM | POA: Diagnosis not present

## 2018-09-24 DIAGNOSIS — L03315 Cellulitis of perineum: Principal | ICD-10-CM | POA: Insufficient documentation

## 2018-09-24 DIAGNOSIS — D72819 Decreased white blood cell count, unspecified: Secondary | ICD-10-CM | POA: Insufficient documentation

## 2018-09-24 DIAGNOSIS — Z833 Family history of diabetes mellitus: Secondary | ICD-10-CM | POA: Diagnosis not present

## 2018-09-24 DIAGNOSIS — L03317 Cellulitis of buttock: Secondary | ICD-10-CM | POA: Diagnosis present

## 2018-09-24 DIAGNOSIS — E871 Hypo-osmolality and hyponatremia: Secondary | ICD-10-CM | POA: Insufficient documentation

## 2018-09-24 DIAGNOSIS — L0390 Cellulitis, unspecified: Secondary | ICD-10-CM | POA: Diagnosis present

## 2018-09-24 DIAGNOSIS — F17210 Nicotine dependence, cigarettes, uncomplicated: Secondary | ICD-10-CM | POA: Diagnosis not present

## 2018-09-24 DIAGNOSIS — K760 Fatty (change of) liver, not elsewhere classified: Secondary | ICD-10-CM | POA: Diagnosis not present

## 2018-09-24 DIAGNOSIS — Z8543 Personal history of malignant neoplasm of ovary: Secondary | ICD-10-CM | POA: Insufficient documentation

## 2018-09-24 DIAGNOSIS — Z880 Allergy status to penicillin: Secondary | ICD-10-CM | POA: Diagnosis not present

## 2018-09-24 LAB — CBC WITH DIFFERENTIAL/PLATELET
ABS IMMATURE GRANULOCYTES: 0.03 10*3/uL (ref 0.00–0.07)
BASOS PCT: 0 %
Basophils Absolute: 0 10*3/uL (ref 0.0–0.1)
Eosinophils Absolute: 0.1 10*3/uL (ref 0.0–0.5)
Eosinophils Relative: 3 %
HEMATOCRIT: 44.4 % (ref 36.0–46.0)
Hemoglobin: 14.8 g/dL (ref 12.0–15.0)
Immature Granulocytes: 1 %
LYMPHS ABS: 1.1 10*3/uL (ref 0.7–4.0)
Lymphocytes Relative: 35 %
MCH: 27.6 pg (ref 26.0–34.0)
MCHC: 33.3 g/dL (ref 30.0–36.0)
MCV: 82.8 fL (ref 80.0–100.0)
MONO ABS: 0.5 10*3/uL (ref 0.1–1.0)
MONOS PCT: 16 %
NEUTROS ABS: 1.4 10*3/uL — AB (ref 1.7–7.7)
Neutrophils Relative %: 45 %
Platelets: 224 10*3/uL (ref 150–400)
RBC: 5.36 MIL/uL — ABNORMAL HIGH (ref 3.87–5.11)
RDW: 11.9 % (ref 11.5–15.5)
WBC: 3 10*3/uL — ABNORMAL LOW (ref 4.0–10.5)
nRBC: 0 % (ref 0.0–0.2)

## 2018-09-24 LAB — COMPREHENSIVE METABOLIC PANEL
ALBUMIN: 3.9 g/dL (ref 3.5–5.0)
ALK PHOS: 77 U/L (ref 38–126)
ALT: 36 U/L (ref 0–44)
AST: 29 U/L (ref 15–41)
Anion gap: 13 (ref 5–15)
BILIRUBIN TOTAL: 1 mg/dL (ref 0.3–1.2)
BUN: 8 mg/dL (ref 6–20)
CO2: 20 mmol/L — ABNORMAL LOW (ref 22–32)
Calcium: 8.7 mg/dL — ABNORMAL LOW (ref 8.9–10.3)
Chloride: 99 mmol/L (ref 98–111)
Creatinine, Ser: 0.94 mg/dL (ref 0.44–1.00)
GFR calc Af Amer: >60 mL/min (ref >60)
GFR calc non Af Amer: >60 mL/min (ref >60)
GLUCOSE: 199 mg/dL — AB (ref 70–99)
Potassium: 3.8 mmol/L (ref 3.5–5.1)
Sodium: 132 mmol/L — ABNORMAL LOW (ref 135–145)
TOTAL PROTEIN: 7.2 g/dL (ref 6.5–8.1)

## 2018-09-24 LAB — URINALYSIS, ROUTINE W REFLEX MICROSCOPIC
Bilirubin Urine: NEGATIVE
Glucose, UA: 50 mg/dL — AB
Hgb urine dipstick: NEGATIVE
Ketones, ur: 20 mg/dL — AB
Leukocytes,Ua: NEGATIVE
NITRITE: NEGATIVE
Protein, ur: NEGATIVE mg/dL
Specific Gravity, Urine: >1.046 — ABNORMAL HIGH (ref 1.005–1.030)
pH: 6 (ref 5.0–8.0)

## 2018-09-24 LAB — LACTIC ACID, PLASMA: Lactic Acid, Venous: 1.4 mmol/L (ref 0.5–1.9)

## 2018-09-24 LAB — I-STAT BETA HCG BLOOD, ED (MC, WL, AP ONLY): I-stat hCG, quantitative: <5 m[IU]/mL (ref <5)

## 2018-09-24 LAB — GLUCOSE, CAPILLARY: Glucose-Capillary: 238 mg/dL — ABNORMAL HIGH (ref 70–99)

## 2018-09-24 LAB — INFLUENZA PANEL BY PCR (TYPE A & B)
INFLBPCR: NEGATIVE
Influenza A By PCR: NEGATIVE

## 2018-09-24 MED ORDER — VANCOMYCIN HCL IN DEXTROSE 1-5 GM/200ML-% IV SOLN
1000.0000 mg | Freq: Once | INTRAVENOUS | Status: AC
  Administered 2018-09-24: 1000 mg via INTRAVENOUS
  Filled 2018-09-24: qty 200

## 2018-09-24 MED ORDER — ACETAMINOPHEN 650 MG RE SUPP: 650.0000 mg | Freq: Four times a day (QID) | RECTAL | Status: DC | PRN

## 2018-09-24 MED ORDER — ACETAMINOPHEN 325 MG PO TABS
650.0000 mg | ORAL_TABLET | Freq: Four times a day (QID) | ORAL | Status: DC | PRN
  Administered 2018-09-24 – 2018-09-25 (×2): 650 mg via ORAL
  Filled 2018-09-24 (×2): qty 2

## 2018-09-24 MED ORDER — SODIUM CHLORIDE 0.9 % IV BOLUS
1000.0000 mL | Freq: Once | INTRAVENOUS | Status: AC
  Administered 2018-09-24: 1000 mL via INTRAVENOUS

## 2018-09-24 MED ORDER — DOXYCYCLINE HYCLATE 100 MG PO TABS
100.0000 mg | ORAL_TABLET | Freq: Two times a day (BID) | ORAL | Status: DC
  Administered 2018-09-25: 100 mg via ORAL
  Filled 2018-09-24: qty 1

## 2018-09-24 MED ORDER — MORPHINE SULFATE (PF) 4 MG/ML IV SOLN
4.0000 mg | Freq: Once | INTRAVENOUS | Status: AC
Start: 2018-09-24 — End: 2018-09-24
  Administered 2018-09-24: 4 mg via INTRAVENOUS
  Filled 2018-09-24: qty 1

## 2018-09-24 MED ORDER — IOHEXOL 300 MG/ML  SOLN
100.0000 mL | Freq: Once | INTRAMUSCULAR | Status: AC | PRN
  Administered 2018-09-24: 100 mL via INTRAVENOUS

## 2018-09-24 MED ORDER — ONDANSETRON HCL 4 MG/2ML IJ SOLN
4.0000 mg | Freq: Once | INTRAMUSCULAR | Status: AC
  Administered 2018-09-24: 4 mg via INTRAVENOUS
  Filled 2018-09-24: qty 2

## 2018-09-24 MED ORDER — ENOXAPARIN SODIUM 40 MG/0.4ML ~~LOC~~ SOLN
40.0000 mg | SUBCUTANEOUS | Status: DC
  Administered 2018-09-24: 40 mg via SUBCUTANEOUS
  Filled 2018-09-24: qty 0.4

## 2018-09-24 NOTE — H&P (Addendum)
Date: 09/24/2018               Patient Name:  Regina Cantu MRN: [id number]  DOB: 23-Aug-1980 Age / Sex: 38 y.o., female   PCP: Lucianne Lei, MD         Medical Service: Internal Medicine Teaching Service         Attending Physician: Dr. Aldine Contes, MD    First Contact: Dr. Myrtie Hawk Pager: [phone]  Second Contact: Dr. Shan Levans Pager: [phone]       After Hours (After 5p/  First Contact Pager: [phone]  weekends / holidays): Second Contact Pager: [phone]   Chief Complaint: Fever and chill  History of Present Illness: 38 year old female with no past medical history except previous history of ovarian cancer status post bilateral oophorectomy and hysterectomy, came into ED due to fever chills for past 2 days.  She noticed 3 painful bumps at her right perineal area 10 days ago, was seen on urgent care and underwent I&D (06/08/2019). She was then prescribed Bactrim.  She initially came back to work without any problem but 2 days ago developed fever, chills, body ache, decreased appetite and nausea.  She had some mild pain on perineal area but mentions that it was not that bad.  However she had some left-sided abdominal discomfort.  No diarrhea but had some constipation which is new for her.  No urinary symptoms, no vaginal discharge.  She mentions that her temperature was at 105 once at home.  She also had some leg cramps.  She denies any sick contact. she mentions that she was doing well before this. She came to Ed for further evaluation.  Pain and cramp on both legs but able to walk and work  Meds:  Current Meds  Medication Sig  . doxycycline (VIBRA-TABS) 100 MG tablet Take 1 tablet (100 mg total) by mouth 2 (two) times daily. (Patient taking differently: Take 100 mg by mouth 2 (two) times daily. For 7 days)     Allergies: Allergies as of 09/24/2018 - Review Complete 09/24/2018  Allergen Reaction Noted  . Penicillins Hives, Nausea And Vomiting, and Rash 03/04/2013   Past  Medical History:  Diagnosis Date  . Hypertension   . Migraine   . Pityriasis rosea     Family History: DM in father. DM in mother. Both Parents had stroke. No cancer in family.   Social History: Smokes  A pack per week. Drinks EtOH occasionally. No drugs  Review of Systems: A complete ROS was negative except as per HPI.   Physical Exam: Blood pressure 118/81, pulse 79, temperature 98.3 F (36.8 C), temperature source Oral, resp. rate 16, height 5\' 2"  (1.575 m), weight 108.9 kg, SpO2 98 %. Physical Exam Vitals signs reviewed.  Constitutional:      Appearance: Normal appearance. She is not ill-appearing.  HENT:     Head: Normocephalic and atraumatic.     Mouth/Throat:     Mouth: Mucous membranes are moist.  Eyes:     Extraocular Movements: Extraocular movements intact.     Pupils: Pupils are equal, round, and reactive to light.  Cardiovascular:     Rate and Rhythm: Tachycardia present.     Heart sounds: Normal heart sounds. No murmur.  Pulmonary:     Effort: Pulmonary effort is normal.     Breath sounds: Normal breath sounds. No wheezing or rales.  Abdominal:     General: Bowel sounds are normal.     Palpations: Abdomen is  soft.     Tenderness: There is abdominal tenderness. There is no right CVA tenderness.     Comments: Mild tenderness on suprapubic area and LLQ.  Musculoskeletal:     Right lower leg: No edema.     Left lower leg: No edema.  Skin:    General: Skin is warm and dry.  Neurological:     General: No focal deficit present.     Mental Status: She is alert and oriented to person, place, and time.  Psychiatric:        Mood and Affect: Mood normal.        Behavior: Behavior normal.        Thought Content: Thought content normal.        Judgment: Judgment normal.    CBC: leukopenia at 3000 Lactic acid: Nl at 1.4  CMP:  Mild hyponatremia at 132. Low HCO# at 20. BG 199 EKG: personally reviewed my interpretation is NSR  CXR: personally reviewed my  interpretation is   CT scan abdomen: Unremarkable except below: 1. Hepatic steatosis. 2. Focal area of slight mucosal thickening of the mid sigmoid region. This may represent stool or spasm but I can not exclude a focal mass. 3. No visualized soft tissue abnormality of the perineum. This exam only includes through the anus.  Assessment & Plan by Problem: Active Problems:   Cellulitis  38 year old female with recent history of perianal abscess status post I&D and on antibiotic, previous history of ovarian cancer, status post bilateral oophorectomy and hysterectomy, came in due to subjective fever and chills, body ache.   Cellulitis SepsisPatient with Hx of perineal abscesses that is post I&D on 2/11 on Bactrim.  Who presented with  fever and chills and body ache since 2 days ago. He also had nausea and loss of appetite with mild left side abdominal pain.  Denies any urinary symptoms or vaginal discharge. Got vancomycin at ED. On arrival: Febrile at 100.8 and mildly tachycardic at 1.3 on arrival.  Blood pressure stable at 129/78.  Some tenderness at right perineal area around I&D site but no evidence of abscess, no evidence of active drainage. Nl lactic acid. Does not look actively infected, how ever, considering this as source of infection, having leukopenia, fever tachycardia, patient meet sepsis criteria. We also check for flu and HIV due to leukopenia and presentation. BHCG: negative, Ct abdomen negative.  -f/u Blood culture -Doxycycline 100 mg p.o. twice daily -Cardiac monitoring -IV line x 2 -Got 1 lit of NS  -CMP -Hemoglobin A1c -Tylenol 650 mg as needed for mild pain -Flu test -U/A  Leukopenia:  Seems to be new. Likely due to infection. How ever, malignancy, HIV,.. can be on DDx. Checking smear and HIV Ab and will repeat CBC.  -HIV antibody -Monitor CBC -Save smear  Hyperglycemia: No Hx of DM. -HbA1c -CMP  Diet: Carb modified  IV fluid: got 1 lit of NS VTE ppx:  Levonox Code status: Full  Dispo: Admit patient to Observation with expected length of stay less than 2 midnights.  SignedDewayne Hatch, MD 09/24/2018, 7:34 PM  Pager: [phone]

## 2018-09-24 NOTE — ED Notes (Signed)
Patient transported to CT 

## 2018-09-24 NOTE — ED Provider Notes (Signed)
[facility]PART[facility] Provider Note   CSN: [id number] Arrival date & time: 09/24/18  1129    History   Chief Complaint Chief Complaint  Patient presents with  . Fever  . Abscess    HPI Regina Cantu is a 38 y.o. female.     HPI   Regina Cantu is a 38 y.o. female, with a history of HTN, hysterectomy, presenting to the ED with perineal pain for the last 2 weeks. Patient was seen at urgent care on February 11, underwent I&D, and prescribed Bactrim.  2 days later, patient was seen again at the urgent care, packing was removed, and a new larger area of induration was noted.  Doxycycline was added to her regimen.  She states since she was seen the second time at urgent care, her pain has spread and increased in intensity.  Pain is sharp, severe, radiating toward the perineum and the abdomen.  She began to have fever, nausea, and vomiting 2 days ago.  Denies chest pain, shortness of breath, diarrhea, abnormal vaginal discharge, or any other complaints.     Past Medical History:  Diagnosis Date  . Hypertension   . Migraine   . Pityriasis rosea     There are no active problems to display for this patient.   Past Surgical History:  Procedure Laterality Date  . ABDOMINAL HYSTERECTOMY    . OVARIAN CANCER       OB History   No obstetric history on file.      Home Medications    Prior to Admission medications   Medication Sig Start Date End Date Taking? Authorizing Provider  doxycycline (VIBRA-TABS) 100 MG tablet Take 1 tablet (100 mg total) by mouth 2 (two) times daily. 09/18/18   Robyn Haber, MD    Family History No family history on file.  Social History Social History   Tobacco Use  . Smoking status: Current Every Day Smoker  . Smokeless tobacco: Never Used  Substance Use Topics  . Alcohol use: Yes  . Drug use: No     Allergies   Penicillins   Review of Systems Review of Systems  Constitutional:  Positive for fever.  Respiratory: Negative for shortness of breath.   Cardiovascular: Negative for chest pain.  Gastrointestinal: Positive for abdominal pain, nausea and vomiting. Negative for blood in stool and diarrhea.  Genitourinary: Positive for pelvic pain. Negative for dysuria, flank pain, hematuria, vaginal discharge and vaginal pain.  Musculoskeletal: Negative for back pain.  All other systems reviewed and are negative.    Physical Exam Updated Vital Signs BP 138/88 (BP Location: Right Arm)   Pulse (!) 103   Temp (!) 100.8 F (38.2 C) (Oral)   Resp 16   SpO2 100%   Physical Exam Vitals signs and nursing note reviewed. Exam conducted with a chaperone present.  Constitutional:      General: She is not in acute distress.    Appearance: She is well-developed. She is not diaphoretic.  HENT:     Head: Normocephalic and atraumatic.     Mouth/Throat:     Mouth: Mucous membranes are moist.     Pharynx: Oropharynx is clear.  Eyes:     Conjunctiva/sclera: Conjunctivae normal.  Neck:     Musculoskeletal: Neck supple.  Cardiovascular:     Rate and Rhythm: Normal rate and regular rhythm.     Pulses: Normal pulses.     Heart sounds: Normal heart sounds.  Pulmonary:  Effort: Pulmonary effort is normal. No respiratory distress.     Breath sounds: Normal breath sounds.  Abdominal:     Palpations: Abdomen is soft.     Tenderness: There is no abdominal tenderness. There is no guarding.  Genitourinary:      Comments: Tenderness, induration, increased warmth to the area indicated above.  Tenderness extends anteriorly to the right of the labia, posteriorly into the buttocks, and toward the midline into the perineum.  No noted area of fluctuance.  No active discharge. Musculoskeletal:     Right lower leg: No edema.     Left lower leg: No edema.  Lymphadenopathy:     Cervical: No cervical adenopathy.  Skin:    General: Skin is warm and dry.  Neurological:     Mental Status:  She is alert.  Psychiatric:        Mood and Affect: Mood and affect normal.        Speech: Speech normal.        Behavior: Behavior normal.      ED Treatments / Results  Labs (all labs ordered are listed, but only abnormal results are displayed) Labs Reviewed  COMPREHENSIVE METABOLIC PANEL - Abnormal; Notable for the following components:      Result Value   Sodium 132 (*)    CO2 20 (*)    Glucose, Bld 199 (*)    Calcium 8.7 (*)    All other components within normal limits  CBC WITH DIFFERENTIAL/PLATELET - Abnormal; Notable for the following components:   WBC 3.0 (*)    RBC 5.36 (*)    Neutro Abs 1.4 (*)    All other components within normal limits  CULTURE, BLOOD (ROUTINE X 2)  CULTURE, BLOOD (ROUTINE X 2)  LACTIC ACID, PLASMA  I-STAT BETA HCG BLOOD, ED ([facility], [facility], [facility] ONLY)    EKG EKG Interpretation  Date/Time:  Wednesday September 24 2018 13:08:05 EST Ventricular Rate:  82 PR Interval:  160 QRS Duration: 84 QT Interval:  376 QTC Calculation: 439 R Axis:   59 Text Interpretation:  Normal sinus rhythm Cannot rule out Anterior infarct , age undetermined Abnormal ECG Confirmed by Carmin Muskrat [phone]) on 09/24/2018 1:31:30 PM   Radiology Ct Abdomen Pelvis W Contrast  Result Date: 09/24/2018 CLINICAL DATA:  Worsening perineal cellulitis. Abdominal and pelvic pain. EXAM: CT ABDOMEN AND PELVIS WITH CONTRAST TECHNIQUE: Multidetector CT imaging of the abdomen and pelvis was performed using the standard protocol following bolus administration of intravenous contrast. CONTRAST:  146mL OMNIPAQUE IOHEXOL 300 MG/ML  SOLN COMPARISON:  None. FINDINGS: Lower chest: Normal. Hepatobiliary: Diffuse hepatic steatosis. No focal lesions. Biliary tree is normal. Pancreas: Unremarkable. No pancreatic ductal dilatation or surrounding inflammatory changes. Spleen: Normal in size without focal abnormality. Adrenals/Urinary Tract: Adrenal glands are unremarkable. Kidneys are normal, without renal  calculi, focal lesion, or hydronephrosis. Bladder is unremarkable. Stomach/Bowel: There is a small focal area of thickening of the wall of the mid sigmoid portion of the colon best seen on image 66 of series 3, image 83 of series 6 and image 90 of series 7. This may reflect spasm stool. I can not exclude a small mass. The bowel otherwise appears normal including the terminal ileum and appendix. Vascular/Lymphatic: No significant vascular findings are present. Tiny areas of calcification in the distal abdominal aorta. No enlarged abdominal or pelvic lymph nodes. Reproductive: Status post hysterectomy. No adnexal masses. Other: No discrete perineal abscess or inflammatory changes are visible on this exam. This exam  includes through the anus. The proximal thighs are not seen below that level. The area of reported inflammation is not included on this exam. No ascites. Musculoskeletal: No acute or significant osseous findings. IMPRESSION: 1. Hepatic steatosis. 2. Focal area of slight mucosal thickening of the mid sigmoid region. This may represent stool or spasm but I can not exclude a focal mass. 3. No visualized soft tissue abnormality of the perineum. This exam only includes through the anus. Electronically Signed   By: Lorriane Shire M.D.   On: 09/24/2018 15:03    Procedures Procedures (including critical care time)  Medications Ordered in ED Medications  sodium chloride 0.9 % bolus 1,000 mL (1,000 mLs Intravenous New Bag/Given 09/24/18 1259)  morphine 4 MG/ML injection 4 mg (4 mg Intravenous Given 09/24/18 1300)  ondansetron (ZOFRAN) injection 4 mg (4 mg Intravenous Given 09/24/18 1300)  vancomycin (VANCOCIN) IVPB 1000 mg/200 mL premix (0 mg Intravenous Stopped 09/24/18 1400)  iohexol (OMNIPAQUE) 300 MG/ML solution 100 mL (100 mLs Intravenous Contrast Given 09/24/18 1430)     Initial Impression / Assessment and Plan / ED Course  I have reviewed the triage vital signs and the nursing notes.  Pertinent  labs & imaging results that were available during my care of the patient were reviewed by me and considered in my medical decision making (see chart for details).  Clinical Course as of Sep 24 1529  Wed Sep 24, 2018  1320 Patient states her pain is well controlled.   [SJ]  [place] with Dr. Shan Levans, IM resident. Agrees to admit the patient.   [SJ]    Clinical Course User Index [SJ] Harvey Matlack C, PA-C       Patient presents with worsening pain and spreading discomfort to the right buttocks and perineum despite oral antibiotics.  Febrile and mild tachycardia.  She has no evidence of intrapelvic or intra-abdominal abscess formation noted on CT.  However, it seems as though she has failed outpatient therapy and would benefit from admission and IV antibiotics.   Findings and plan of care discussed with Adrian Prows, MD.   Vitals:   09/24/18 1150 09/24/18 1258 09/24/18 1403  BP: 138/88  129/78  Pulse: (!) 103  84  Resp: 16  16  Temp: (!) 100.8 F (38.2 C)    TempSrc: Oral    SpO2: 100%  97%  Weight:  108.9 kg   Height:  5\' 2"  (1.575 m)      Final Clinical Impressions(s) / ED Diagnoses   Final diagnoses:  Cellulitis of buttock    ED Discharge Orders    None       Layla Maw 09/24/18 1531    Carmin Muskrat, MD 09/24/18 1858

## 2018-09-24 NOTE — ED Triage Notes (Signed)
Pt to ED for evaluation of fever of 104 and body aches since Monday. Seen at [facility] for R thigh/groin abscess last week and says they drained and packed it but it has come back. Taking abx as prescribed.

## 2018-09-25 LAB — COMPREHENSIVE METABOLIC PANEL
ALBUMIN: 3.2 g/dL — AB (ref 3.5–5.0)
ALT: 36 U/L (ref 0–44)
AST: 27 U/L (ref 15–41)
Alkaline Phosphatase: 67 U/L (ref 38–126)
Anion gap: 10 (ref 5–15)
BUN: 10 mg/dL (ref 6–20)
CO2: 26 mmol/L (ref 22–32)
Calcium: 8.5 mg/dL — ABNORMAL LOW (ref 8.9–10.3)
Chloride: 97 mmol/L — ABNORMAL LOW (ref 98–111)
Creatinine, Ser: 0.72 mg/dL (ref 0.44–1.00)
GFR calc Af Amer: >60 mL/min (ref >60)
GFR calc non Af Amer: >60 mL/min (ref >60)
Glucose, Bld: 239 mg/dL — ABNORMAL HIGH (ref 70–99)
Potassium: 4.3 mmol/L (ref 3.5–5.1)
SODIUM: 133 mmol/L — AB (ref 135–145)
Total Bilirubin: 0.5 mg/dL (ref 0.3–1.2)
Total Protein: 6.1 g/dL — ABNORMAL LOW (ref 6.5–8.1)

## 2018-09-25 LAB — PATHOLOGIST SMEAR REVIEW

## 2018-09-25 LAB — MRSA PCR SCREENING: MRSA by PCR: NEGATIVE

## 2018-09-25 LAB — CBC
HEMATOCRIT: 39.2 % (ref 36.0–46.0)
Hemoglobin: 13.3 g/dL (ref 12.0–15.0)
MCH: 28.4 pg (ref 26.0–34.0)
MCHC: 33.9 g/dL (ref 30.0–36.0)
MCV: 83.8 fL (ref 80.0–100.0)
Platelets: 210 10*3/uL (ref 150–400)
RBC: 4.68 MIL/uL (ref 3.87–5.11)
RDW: 12.3 % (ref 11.5–15.5)
WBC: 3.7 10*3/uL — ABNORMAL LOW (ref 4.0–10.5)
nRBC: 0 % (ref 0.0–0.2)

## 2018-09-25 LAB — HIV ANTIBODY (ROUTINE TESTING W REFLEX): HIV Screen 4th Generation wRfx: NONREACTIVE

## 2018-09-25 LAB — SAVE SMEAR(SSMR), FOR PROVIDER SLIDE REVIEW

## 2018-09-25 MED ORDER — ACETAMINOPHEN 325 MG PO TABS: 650.0000 mg | ORAL_TABLET | Freq: Four times a day (QID) | ORAL | Status: DC | PRN

## 2018-09-25 NOTE — Discharge Summary (Signed)
Name: Regina Cantu MRN: [id number] DOB: 1981/08/06 38 y.o. PCP: Lucianne Lei, MD  Date of Admission: 09/24/2018 11:45 AM Date of Discharge: 09/25/18 Attending Physician: Aldine Contes, MD  Discharge Diagnosis: 1. Active Problems:   Cellulitis  2. Hyperglycemia  Discharge Medications: Allergies as of 09/25/2018      Reactions   Penicillins Hives, Nausea And Vomiting, Rash      Medication List    TAKE these medications   acetaminophen 325 MG tablet Commonly known as:  TYLENOL Take 2 tablets (650 mg total) by mouth every 6 (six) hours as needed for mild pain, moderate pain or fever (or Fever >/= 101).   doxycycline 100 MG tablet Commonly known as:  VIBRA-TABS Take 1 tablet (100 mg total) by mouth 2 (two) times daily. What changed:  additional instructions       Disposition and follow-up:   Regina Cantu was discharged from [facility] in Stable condition.  At the hospital follow up visit please address:  1.  Perineal cellulitis cellulitis: No evidence of abscess, patient got 1 dose of vancomycin in ED and then switched to p.o. doxycycline.  Exam On discharge: right perineal area with scar from previous I&D well healed, No cellulitis, no area of significant fluctuance or induration. No incision and drainage required per general surgery.  Please reevaluate her in few days.  2.  Patient with no evidence of diabetes before, but with positive family history. She found to be hyperglycemic during this hospitalization.  Hb A1c is pending. Please follow up and evaluate for diabetes.  3.  Labs / imaging needed at time of follow-up: CBC,  4.  Pending labs/ test needing follow-up: HIV, hemoglobin A1c, blood culture  Follow-up Appointments: Follow-up Information    Lucianne Lei, MD. Schedule an appointment as soon as possible for a visit in 3 day(s).   Specialty:  Family Medicine Why:  Please make an appointment for next 3 or 4 days to be seen by  your primary care.  To check the area of previous abscess, as well as checking for diabetes. Contact information: [name] [place] [address] [phone]           Hospital Course by problem list: 1. Perineal cellulitis:  Ms. Steedley is a 38 year old female with PMHx of ovarian cancer status post bilateral nephrectomy and hysterectomy several years ago, who presented to the ED with fevers, chills and pain in her perineal region over the last couple of days. She reports noticing 3 painful bumps in her perineal region 10 days ago. She went to an urgent care center and was diagnosed with an abscess and underwent an I&D at that time.   She was prescribed Bactrim after the procedure and states that she improved and went back to work. How ever, 2 days before admission, patient noted fevers and chills at home with associated body ache, nausea and some decreased appetite. Also reports some mild pain in her perineal region but that it was not as bad as it was before.  She came to the ED for further evaluation. Was mildly febrile and tachycardic and had mild leukopenia at 3 on arrival, but no evidence of abscess on exam and CT scan. Initially got 1 dose of Vancomycin in ED. Antibiotic then switched to p.o. doxycycline.  Her symptoms improved next day. No fever and no leukocytosis (WBC at 3.7). General surgery also saw the patient and did not recommend any I and D.  Exam On discharge: right perineal area with scar from previous I&D (at urgent care) is healing well and is intact w/o drainage. Has mild tenderness in the area. No cellulitis, no area of significant fluctuance or induration. Patient is discharged with PO Doxycycline.    Discharge Vitals:   BP 124/76   Pulse 85   Temp 97.8 F (36.6 C) (Oral)   Resp 18   Ht 5\' 2"  (1.575 m)   Wt 108.9 kg   SpO2 99%   BMI 43.90 kg/m   Pertinent Labs, Studies, and Procedures:  CBC Latest Ref Rng & Units 09/25/2018 09/24/2018 01/06/2017  WBC  4.0 - 10.5 K/uL 3.7(L) 3.0(L) 9.3  Hemoglobin 12.0 - 15.0 g/dL 13.3 14.8 13.6  Hematocrit 36.0 - 46.0 % 39.2 44.4 41.0  Platelets 150 - 400 K/uL 210 224 378   BMP Latest Ref Rng & Units 09/25/2018 09/24/2018 01/06/2017  Glucose 70 - 99 mg/dL 239(H) 199(H) 139(H)  BUN 6 - 20 mg/dL 10 8 19   Creatinine 0.44 - 1.00 mg/dL 0.72 0.94 0.98  Sodium 135 - 145 mmol/L 133(L) 132(L) 136  Potassium 3.5 - 5.1 mmol/L 4.3 3.8 3.6  Chloride 98 - 111 mmol/L 97(L) 99 104  CO2 22 - 32 mmol/L 26 20(L) 23  Calcium 8.9 - 10.3 mg/dL 8.5(L) 8.7(L) 9.1   MRSA screening: Negative Flu test: Negative I-STAT with a high CG: Negative Take acid: Normal   Discharge Instructions: Discharge Instructions    Call MD for:  severe uncontrolled pain   Complete by:  As directed    Diet - low sodium heart healthy   Complete by:  As directed    Discharge instructions   Complete by:  As directed    You for allowing Regina Cantu taking care of you at [facility] came in due to fever and and having some pain on the area of recent abscess drained by urgent care.  Received 1 dose of IV antibiotic at ED yesterday and we have put you on oral antibiotic (doxycycline).  Glad that you are doing better.  You did not have any more fever today and is no active infection or abscess.  General surgery also saw you and confirmed that there is no need for further drainage.  Please take antibiotic as instructed for you.  You can take Tylenol as needed for pain.  Feel free to call Regina Cantu at [phone] if you have any question or concern.  As we discussed, your blood sugar was elevated when you were in hospital.  Please make sure to follow-up with your primary care for repeating the test and management.  Thank you.   Increase activity slowly   Complete by:  As directed       Signed: Dewayne Hatch, MD 09/25/2018, 12:57 PM   Pager: [phone]

## 2018-09-25 NOTE — Progress Notes (Signed)
   Subjective: patient feels much better today.  She still has pain at the abscess site.  No cp or SOB.     Objective:  Vital signs in last 24 hours: Vitals:   09/24/18 1258 09/24/18 1403 09/24/18 1815 09/24/18 2337  BP:  129/78 118/81 131/79  Pulse:  84 79 83  Resp:  16 16 16   Temp:   98.3 F (36.8 C) 97.7 F (36.5 C)  TempSrc:   Oral Oral  SpO2:  97% 98% 96%  Weight: 108.9 kg     Height: 5\' 2"  (1.575 m)      Physical Exam  Constitutional: Appears well-developed and well-nourished. No distress.  HENT:  Head: Normocephalic and atraumatic.  Eyes: Conjunctivae are normal.  Cardiovascular: Normal rate, regular rhythm and normal heart sounds.  Respiratory: Effort normal and breath sounds normal. No respiratory distress. No wheezes.  GI: Soft. Bowel sounds are normal. No distension. There is no tenderness.  Musculoskeletal: No edema.  Neurological: Is alert.  Skin: Not diaphoretic. No erythema. Has mild swelling and tenderness at right perineal area, some bulding around I&D site but no evidence of abscess, no evidence of active drainage Psychiatric: Normal mood and affect. Behavior is normal. Judgment and thought content normal.   Assessment/Plan:  Active Problems:   Cellulitis  38 year old female with recent history of perianal abscess status post I&D and on antibiotic, previous history of ovarian cancer, status post bilateral oophorectomy and hysterectomy, came in due to subjective feverm chills and body ache.   Perineal cellulitis/possible abscess: Patient with Hx of perineal abscesses that is post I&D on 2/11 and on Bactrim presented with  fever and chills and body ache since 2 days ago. He also had nausea and loss of appetite with mild left side abdominal pain.CT abdomen unremarkable. Had leukopenoa. Got Vancomycin at ED for cellulitis. We switched to Doxyxyclin 100 mg BID.   She is doing better today. Has been afebrile today. WBC: 3.7. No evidence of abscess on exam.  Generally improved but still has pain with movement or sitting due to tenderness. And I appreciated more bulging and some induration (w/o fluctuance on exam). May take benefit of draignage. We continue Doxycyclin and may discharge todayor tomorrow.  -f/u Blood culture -Continue Doxycycline 100 mg p.o. twice daily -Cardiac monitoring  -F/u Hemoglobin A1c -Tylenol 650 mg as needed for mild pain -Flu test--> Nrgative -U/A-->Hazy, with [place], otherwise unremarkable -Consult general surgery for possible drainage, we appreciate their recommendation-->   ADDENDUM: General surgery did not recommend drainage.  -We will discharge patient with PO Doxycyclin.  Leukopenia: WBC today improved tp 3.7 from 3 yesterday Seems to be new. Likely due to infection. How ever, malignancy, HIV. can be on DDx. Checking smear and HIV Ab and will repeat CBC.  -HIV antibody---> Pending -Monitor CBC -Save smear. Pathology read is pending -D/C tpoday and f/u labs outpatient.  Hyperglycemia: No Hx of DM. -HbA1c-->pending -F/u outpatient    Dispo: Discharge home today  Dewayne Hatch, MD 09/25/2018, 5:16 AM Pager: [phone]

## 2018-09-25 NOTE — Consult Note (Signed)
[facility] Consult Note  [place] June 01, 1981  [id number].    Requesting MD: Dewayne Hatch  Chief Complaint/Reason for Consult: perineal pain  HPI:  Regina Cantu is a 38yo female admitted to [facility] 2/19 with 2 days of worsening fever, chills, and painful bumps x3 in her perineum. States that she was seen in urgent care 09/16/2018 and underwent I&D. She was discharged on bactrim which she had been taking. Initially felt a little better, then the pain got worse. Denies any drainage in this area. She was given IV vancomycin in the ED yesterday then switched to oral doxycycline. CT scan in the ED yesterday showed no drainable fluid collection. States that she continues to have significant pain in this area. Worse with walking or palpation. General surgery asked to see.   ROS: Review of Systems  Constitutional: Positive for chills and fever.  HENT: Negative.   Eyes: Negative.   Respiratory: Negative.   Cardiovascular: Negative.   Gastrointestinal: Negative.   Genitourinary:       Perineal pain  Musculoskeletal: Negative.   Skin: Negative.   Neurological: Negative.    All systems reviewed and otherwise negative except for as above  History reviewed. No pertinent family history.  Past Medical History:  Diagnosis Date  . Hypertension   . Migraine   . Pityriasis rosea     Past Surgical History:  Procedure Laterality Date  . ABDOMINAL HYSTERECTOMY    . OVARIAN CANCER      Social History:  reports that she has been smoking. She has never used smokeless tobacco. She reports current alcohol use. She reports that she does not use drugs.  Allergies:  Allergies  Allergen Reactions  . Penicillins Hives, Nausea And Vomiting and Rash    Medications Prior to Admission  Medication Sig Dispense Refill  . doxycycline (VIBRA-TABS) 100 MG tablet Take 1 tablet (100 mg total) by mouth 2 (two) times daily. (Patient taking differently: Take 100 mg by mouth 2  (two) times daily. For 7 days) 20 tablet 0    Prior to Admission medications   Medication Sig Start Date End Date Taking? Authorizing Provider  doxycycline (VIBRA-TABS) 100 MG tablet Take 1 tablet (100 mg total) by mouth 2 (two) times daily. Patient taking differently: Take 100 mg by mouth 2 (two) times daily. For 7 days 09/18/18  Yes Robyn Haber, MD    Blood pressure 124/76, pulse 85, temperature 97.8 F (36.6 C), temperature source Oral, resp. rate 18, height 5\' 2"  (1.575 m), weight 108.9 kg, SpO2 99 %. Physical Exam: General: pleasant, WD/WN AA female who is laying in bed in NAD HEENT: head is normocephalic, atraumatic.  Sclera are noninjected.  Pupils equal and round.  Ears and nose without any masses or lesions.  Mouth is pink and moist. Dentition fair Lungs: Respiratory effort nonlabored, no audible wheezing Abd: obese, soft, NT/ND, no masses, hernias, or organomegaly MS: calves soft and nontender without edema Skin: warm and dry with no masses, lesions, or rashes Psych: A&Ox3 with an appropriate affect. Neuro: moving all 4 extremities GU: right perineal area with scar from previous I&D well healed, no cellulitis, no area of significant fluctuance or induration   Results for orders placed or performed during the hospital encounter of 09/24/18 (from the past 48 hour(s))  Culture, blood (routine x 2)     Status: None (Preliminary result)   Collection Time: 09/24/18 12:56 PM  Result Value Ref Range   Specimen Description BLOOD RIGHT HAND  Special Requests      BOTTLES DRAWN AEROBIC AND ANAEROBIC Blood Culture results may not be optimal due to an inadequate volume of blood received in culture bottles   Culture      NO GROWTH < 24 HOURS Performed at [facility] [address] [place], [place] [address]    Report Status PENDING   Culture, blood (routine x 2)     Status: None (Preliminary result)   Collection Time: 09/24/18 12:56 PM  Result Value Ref Range    Specimen Description BLOOD RIGHT HAND    Special Requests      BOTTLES DRAWN AEROBIC ONLY Blood Culture results may not be optimal due to an inadequate volume of blood received in culture bottles   Culture      NO GROWTH < 24 HOURS Performed at [facility] [address] [place], [place] [address]    Report Status PENDING   Comprehensive metabolic panel     Status: Abnormal   Collection Time: 09/24/18 12:56 PM  Result Value Ref Range   Sodium 132 (L) 135 - 145 mmol/L   Potassium 3.8 3.5 - 5.1 mmol/L   Chloride 99 98 - 111 mmol/L   CO2 20 (L) 22 - 32 mmol/L   Glucose, Bld 199 (H) 70 - 99 mg/dL   BUN 8 6 - 20 mg/dL   Creatinine, Ser 0.94 0.44 - 1.00 mg/dL   Calcium 8.7 (L) 8.9 - 10.3 mg/dL   Total Protein 7.2 6.5 - 8.1 g/dL   Albumin 3.9 3.5 - 5.0 g/dL   AST 29 15 - 41 U/L   ALT 36 0 - 44 U/L   Alkaline Phosphatase 77 38 - 126 U/L   Total Bilirubin 1.0 0.3 - 1.2 mg/dL   GFR calc non Af Amer >60 >60 mL/min   GFR calc Af Amer >60 >60 mL/min   Anion gap 13 5 - 15    Comment: Performed at [facility], [place] [address] [place], [place] [address]  CBC with Differential     Status: Abnormal   Collection Time: 09/24/18 12:56 PM  Result Value Ref Range   WBC 3.0 (L) 4.0 - 10.5 K/uL   RBC 5.36 (H) 3.87 - 5.11 MIL/uL   Hemoglobin 14.8 12.0 - 15.0 g/dL   HCT 44.4 36.0 - 46.0 %   MCV 82.8 80.0 - 100.0 fL   MCH 27.6 26.0 - 34.0 pg   MCHC 33.3 30.0 - 36.0 g/dL   RDW 11.9 11.5 - 15.5 %   Platelets 224 150 - 400 K/uL   nRBC 0.0 0.0 - 0.2 %   Neutrophils Relative % 45 %   Neutro Abs 1.4 (L) 1.7 - 7.7 K/uL   Lymphocytes Relative 35 %   Lymphs Abs 1.1 0.7 - 4.0 K/uL   Monocytes Relative 16 %   Monocytes Absolute 0.5 0.1 - 1.0 K/uL   Eosinophils Relative 3 %   Eosinophils Absolute 0.1 0.0 - 0.5 K/uL   Basophils Relative 0 %   Basophils Absolute 0.0 0.0 - 0.1 K/uL   Immature Granulocytes 1 %   Abs Immature Granulocytes 0.03 0.00 - 0.07 K/uL    Comment: Performed at [facility] [address] [place], [place] [address]  Lactic acid, plasma     Status: None   Collection Time: 09/24/18 12:56 PM  Result Value Ref Range   Lactic Acid, Venous 1.4 0.5 - 1.9 mmol/L    Comment:  Performed at [facility], [place] [address], [place], [place] [address]  Save Smear     Status: None   Collection Time: 09/24/18 12:56 PM  Result Value Ref Range   Smear Review SMEAR STAINED AND AVAILABLE FOR REVIEW     Comment: Performed at [facility], [place] [address] [place], [place] [address]  I-Stat beta hCG blood, ED     Status: None   Collection Time: 09/24/18  1:20 PM  Result Value Ref Range   I-stat hCG, quantitative <5.0 <5 mIU/mL   Comment 3            Comment:   GEST. AGE      CONC.  (mIU/mL)   <=1 WEEK        5 - 50     2 WEEKS       50 - 500     3 WEEKS       100 - 10,000     4 WEEKS     1,000 - 30,000        FEMALE AND NON-PREGNANT FEMALE:     LESS THAN 5 mIU/mL   Urinalysis, Routine w reflex microscopic     Status: Abnormal   Collection Time: 09/24/18  7:51 PM  Result Value Ref Range   Color, Urine YELLOW YELLOW   APPearance HAZY (A) CLEAR   Specific Gravity, Urine >1.046 (H) 1.005 - 1.030   pH 6.0 5.0 - 8.0   Glucose, UA 50 (A) NEGATIVE mg/dL   Hgb urine dipstick NEGATIVE NEGATIVE   Bilirubin Urine NEGATIVE NEGATIVE   Ketones, ur 20 (A) NEGATIVE mg/dL   Protein, ur NEGATIVE NEGATIVE mg/dL   Nitrite NEGATIVE NEGATIVE   Leukocytes,Ua NEGATIVE NEGATIVE    Comment: Performed at [facility], [place] [address], [place], [place] [address]  Influenza panel by PCR (type A & B)     Status: None   Collection Time: 09/24/18  8:03 PM  Result Value Ref Range   Influenza A By PCR NEGATIVE NEGATIVE   Influenza B By PCR NEGATIVE NEGATIVE    Comment: (NOTE) The Xpert Xpress Flu assay is intended as an aid in the diagnosis of  influenza and should not be used as a sole basis for treatment.  This  assay is FDA approved for nasopharyngeal swab specimens  only. Nasal  washings and aspirates are unacceptable for Xpert Xpress Flu testing. Performed at [facility], [place] [address], [place], [place] [address]   Glucose, capillary     Status: Abnormal   Collection Time: 09/24/18  9:08 PM  Result Value Ref Range   Glucose-Capillary 238 (H) 70 - 99 mg/dL  MRSA PCR Screening     Status: None   Collection Time: 09/25/18  6:38 AM  Result Value Ref Range   MRSA by PCR NEGATIVE NEGATIVE    Comment:        The GeneXpert MRSA Assay (FDA approved for NASAL specimens only), is one component of a comprehensive MRSA colonization surveillance program. It is not intended to diagnose MRSA infection nor to guide or monitor treatment for MRSA infections. Performed at [facility], [place] [address] [place], [place] [address]   Comprehensive metabolic panel     Status: Abnormal   Collection Time: 09/25/18  6:44 AM  Result Value Ref Range   Sodium 133 (L) 135 - 145 mmol/L   Potassium 4.3 3.5 - 5.1 mmol/L   Chloride 97 (L)  98 - 111 mmol/L   CO2 26 22 - 32 mmol/L   Glucose, Bld 239 (H) 70 - 99 mg/dL   BUN 10 6 - 20 mg/dL   Creatinine, Ser 0.72 0.44 - 1.00 mg/dL   Calcium 8.5 (L) 8.9 - 10.3 mg/dL   Total Protein 6.1 (L) 6.5 - 8.1 g/dL   Albumin 3.2 (L) 3.5 - 5.0 g/dL   AST 27 15 - 41 U/L   ALT 36 0 - 44 U/L   Alkaline Phosphatase 67 38 - 126 U/L   Total Bilirubin 0.5 0.3 - 1.2 mg/dL   GFR calc non Af Amer >60 >60 mL/min   GFR calc Af Amer >60 >60 mL/min   Anion gap 10 5 - 15    Comment: Performed at [facility], [place] [address] [place], [place] [address]  CBC     Status: Abnormal   Collection Time: 09/25/18  6:44 AM  Result Value Ref Range   WBC 3.7 (L) 4.0 - 10.5 K/uL   RBC 4.68 3.87 - 5.11 MIL/uL   Hemoglobin 13.3 12.0 - 15.0 g/dL   HCT 39.2 36.0 - 46.0 %   MCV 83.8 80.0 - 100.0 fL   MCH 28.4 26.0 - 34.0 pg   MCHC 33.9 30.0 - 36.0 g/dL   RDW 12.3 11.5 - 15.5 %   Platelets 210 150 - 400 K/uL   nRBC 0.0 0.0 - 0.2 %     Comment: Performed at [facility], [place] [address], [place], [place] [address]   Ct Abdomen Pelvis W Contrast  Result Date: 09/24/2018 CLINICAL DATA:  Worsening perineal cellulitis. Abdominal and pelvic pain. EXAM: CT ABDOMEN AND PELVIS WITH CONTRAST TECHNIQUE: Multidetector CT imaging of the abdomen and pelvis was performed using the standard protocol following bolus administration of intravenous contrast. CONTRAST:  134mL OMNIPAQUE IOHEXOL 300 MG/ML  SOLN COMPARISON:  None. FINDINGS: Lower chest: Normal. Hepatobiliary: Diffuse hepatic steatosis. No focal lesions. Biliary tree is normal. Pancreas: Unremarkable. No pancreatic ductal dilatation or surrounding inflammatory changes. Spleen: Normal in size without focal abnormality. Adrenals/Urinary Tract: Adrenal glands are unremarkable. Kidneys are normal, without renal calculi, focal lesion, or hydronephrosis. Bladder is unremarkable. Stomach/Bowel: There is a small focal area of thickening of the wall of the mid sigmoid portion of the colon best seen on image 66 of series 3, image 83 of series 6 and image 90 of series 7. This may reflect spasm stool. I can not exclude a small mass. The bowel otherwise appears normal including the terminal ileum and appendix. Vascular/Lymphatic: No significant vascular findings are present. Tiny areas of calcification in the distal abdominal aorta. No enlarged abdominal or pelvic lymph nodes. Reproductive: Status post hysterectomy. No adnexal masses. Other: No discrete perineal abscess or inflammatory changes are visible on this exam. This exam includes through the anus. The proximal thighs are not seen below that level. The area of reported inflammation is not included on this exam. No ascites. Musculoskeletal: No acute or significant osseous findings. IMPRESSION: 1. Hepatic steatosis. 2. Focal area of slight mucosal thickening of the mid sigmoid region. This may represent stool or spasm but I can not exclude a focal mass.  3. No visualized soft tissue abnormality of the perineum. This exam only includes through the anus. Electronically Signed   By: Lorriane Shire M.D.   On: 09/24/2018 15:03      Assessment/Plan Perineal abscess S/p I&D 09/16/2018 in Urgent Care - Patient seen and examined with Dr. Rosendo Gros.  No fluctuant area noted on exam that would warrant incision and drainage. Continue antibiotics, sitz baths if this helps with pain. General surgery will sign off, please call us back with any concerns.  Wellington Hampshire, [facility] 09/25/2018, 12:56 PM Pager: [phone] Mon-Thurs 7:00 am-4:30 pm Fri 7:00 am -11:30 AM Sat-Sun 7:00 am-11:30 am

## 2018-09-26 ENCOUNTER — Telehealth: Payer: Self-pay | Admitting: Internal Medicine

## 2018-09-26 LAB — HEMOGLOBIN A1C
Hgb A1c MFr Bld: 10.1 % — ABNORMAL HIGH (ref 4.8–5.6)
Mean Plasma Glucose: 243 mg/dL

## 2018-09-26 MED ORDER — METFORMIN HCL ER 500 MG PO TB24: ORAL_TABLET | ORAL | 0 refills | Status: DC

## 2018-09-26 NOTE — Telephone Encounter (Signed)
Spoke with patient informed her of her diabetes diagnosis.  She will follow up with [facility] on [address] in early March.  We discussed starting her on metformin in the mean time.

## 2018-09-29 LAB — CULTURE, BLOOD (ROUTINE X 2)
CULTURE: NO GROWTH
Culture: NO GROWTH

## 2018-11-27 ENCOUNTER — Other Ambulatory Visit: Payer: Self-pay | Admitting: Family Medicine

## 2018-11-27 DIAGNOSIS — Z1231 Encounter for screening mammogram for malignant neoplasm of breast: Secondary | ICD-10-CM

## 2019-01-27 ENCOUNTER — Other Ambulatory Visit: Payer: Self-pay

## 2019-01-27 ENCOUNTER — Ambulatory Visit
Admission: RE | Admit: 2019-01-27 | Discharge: 2019-01-27 | Disposition: A | Discharge status: Home / Self Care | Payer: 59 | Source: Ambulatory Visit | Attending: Family Medicine | Admitting: Family Medicine

## 2019-01-27 ENCOUNTER — Other Ambulatory Visit: Payer: Self-pay | Admitting: Family Medicine

## 2019-01-27 DIAGNOSIS — Z1231 Encounter for screening mammogram for malignant neoplasm of breast: Secondary | ICD-10-CM

## 2019-01-27 HISTORY — DX: Malignant (primary) neoplasm, unspecified: C80.1

## 2021-05-24 DIAGNOSIS — H6983 Other specified disorders of Eustachian tube, bilateral: Secondary | ICD-10-CM | POA: Insufficient documentation

## 2021-05-24 DIAGNOSIS — T7840XA Allergy, unspecified, initial encounter: Secondary | ICD-10-CM | POA: Insufficient documentation

## 2021-05-24 DIAGNOSIS — C801 Malignant (primary) neoplasm, unspecified: Secondary | ICD-10-CM | POA: Insufficient documentation

## 2021-05-24 DIAGNOSIS — J0110 Acute frontal sinusitis, unspecified: Secondary | ICD-10-CM | POA: Insufficient documentation

## 2022-01-13 ENCOUNTER — Emergency Department (HOSPITAL_COMMUNITY): Payer: BC Managed Care – PPO

## 2022-01-13 ENCOUNTER — Emergency Department (HOSPITAL_COMMUNITY)
Admission: EM | Admit: 2022-01-13 | Discharge: 2022-01-13 | Disposition: A | Discharge status: Home / Self Care | Payer: BC Managed Care – PPO | Attending: Emergency Medicine | Admitting: Emergency Medicine

## 2022-01-13 ENCOUNTER — Other Ambulatory Visit: Payer: Self-pay

## 2022-01-13 DIAGNOSIS — Z20822 Contact with and (suspected) exposure to covid-19: Secondary | ICD-10-CM | POA: Diagnosis not present

## 2022-01-13 DIAGNOSIS — E119 Type 2 diabetes mellitus without complications: Secondary | ICD-10-CM | POA: Diagnosis not present

## 2022-01-13 DIAGNOSIS — J189 Pneumonia, unspecified organism: Secondary | ICD-10-CM | POA: Insufficient documentation

## 2022-01-13 DIAGNOSIS — Z7984 Long term (current) use of oral hypoglycemic drugs: Secondary | ICD-10-CM | POA: Insufficient documentation

## 2022-01-13 DIAGNOSIS — R791 Abnormal coagulation profile: Secondary | ICD-10-CM | POA: Diagnosis not present

## 2022-01-13 DIAGNOSIS — R509 Fever, unspecified: Secondary | ICD-10-CM | POA: Diagnosis present

## 2022-01-13 LAB — CBC WITH DIFFERENTIAL/PLATELET
Abs Immature Granulocytes: 0.1 10*3/uL — ABNORMAL HIGH (ref 0.00–0.07)
Basophils Absolute: 0 10*3/uL (ref 0.0–0.1)
Basophils Relative: 0 %
Eosinophils Absolute: 0.1 10*3/uL (ref 0.0–0.5)
Eosinophils Relative: 1 %
HCT: 38.6 % (ref 36.0–46.0)
Hemoglobin: 13.7 g/dL (ref 12.0–15.0)
Immature Granulocytes: 1 %
Lymphocytes Relative: 14 %
Lymphs Abs: 1.8 10*3/uL (ref 0.7–4.0)
MCH: 30.1 pg (ref 26.0–34.0)
MCHC: 35.5 g/dL (ref 30.0–36.0)
MCV: 84.8 fL (ref 80.0–100.0)
Monocytes Absolute: 1.2 10*3/uL — ABNORMAL HIGH (ref 0.1–1.0)
Monocytes Relative: 9 %
Neutro Abs: 9.8 10*3/uL — ABNORMAL HIGH (ref 1.7–7.7)
Neutrophils Relative %: 75 %
Platelets: 292 10*3/uL (ref 150–400)
RBC: 4.55 MIL/uL (ref 3.87–5.11)
RDW: 11.4 % — ABNORMAL LOW (ref 11.5–15.5)
WBC: 13 10*3/uL — ABNORMAL HIGH (ref 4.0–10.5)
nRBC: 0 % (ref 0.0–0.2)

## 2022-01-13 LAB — URINALYSIS, ROUTINE W REFLEX MICROSCOPIC
Bilirubin Urine: NEGATIVE
Glucose, UA: NEGATIVE mg/dL
Hgb urine dipstick: NEGATIVE
Ketones, ur: NEGATIVE mg/dL
Leukocytes,Ua: NEGATIVE
Nitrite: NEGATIVE
Protein, ur: NEGATIVE mg/dL
Specific Gravity, Urine: 1.011 (ref 1.005–1.030)
pH: 7 (ref 5.0–8.0)

## 2022-01-13 LAB — SEDIMENTATION RATE: Sed Rate: 74 mm/hr — ABNORMAL HIGH (ref 0–22)

## 2022-01-13 LAB — PHOSPHORUS: Phosphorus: 1.6 mg/dL — ABNORMAL LOW (ref 2.5–4.6)

## 2022-01-13 LAB — LACTIC ACID, PLASMA
Lactic Acid, Venous: 1.3 mmol/L (ref 0.5–1.9)
Lactic Acid, Venous: 1 mmol/L (ref 0.5–1.9)

## 2022-01-13 LAB — RESP PANEL BY RT-PCR (FLU A&B, COVID) ARPGX2
Influenza A by PCR: NEGATIVE
Influenza B by PCR: NEGATIVE
SARS Coronavirus 2 by RT PCR: NEGATIVE

## 2022-01-13 LAB — COMPREHENSIVE METABOLIC PANEL
ALT: 51 U/L — ABNORMAL HIGH (ref 0–44)
AST: 37 U/L (ref 15–41)
Albumin: 4 g/dL (ref 3.5–5.0)
Alkaline Phosphatase: 108 U/L (ref 38–126)
Anion gap: 10 (ref 5–15)
BUN: 8 mg/dL (ref 6–20)
CO2: 23 mmol/L (ref 22–32)
Calcium: 9.2 mg/dL (ref 8.9–10.3)
Chloride: 102 mmol/L (ref 98–111)
Creatinine, Ser: 0.56 mg/dL (ref 0.44–1.00)
GFR, Estimated: >60 mL/min (ref >60)
Glucose, Bld: 218 mg/dL — ABNORMAL HIGH (ref 70–99)
Potassium: 3.7 mmol/L (ref 3.5–5.1)
Sodium: 135 mmol/L (ref 135–145)
Total Bilirubin: 0.9 mg/dL (ref 0.3–1.2)
Total Protein: 7.7 g/dL (ref 6.5–8.1)

## 2022-01-13 LAB — HEMOGLOBIN A1C
Hgb A1c MFr Bld: 7.6 % — ABNORMAL HIGH (ref 4.8–5.6)
Mean Plasma Glucose: 171.42 mg/dL

## 2022-01-13 LAB — TSH: TSH: 0.588 u[IU]/mL (ref 0.350–4.500)

## 2022-01-13 LAB — LIPASE, BLOOD: Lipase: 23 U/L (ref 11–51)

## 2022-01-13 LAB — PROTIME-INR
INR: 1 (ref 0.8–1.2)
Prothrombin Time: 12.9 seconds (ref 11.4–15.2)

## 2022-01-13 LAB — MAGNESIUM: Magnesium: 2.1 mg/dL (ref 1.7–2.4)

## 2022-01-13 LAB — C-REACTIVE PROTEIN: CRP: 19.6 mg/dL — ABNORMAL HIGH (ref <1.0)

## 2022-01-13 LAB — TROPONIN I (HIGH SENSITIVITY)
Troponin I (High Sensitivity): 4 ng/L (ref <18)
Troponin I (High Sensitivity): 4 ng/L (ref <18)

## 2022-01-13 MED ORDER — IOHEXOL 300 MG/ML  SOLN
100.0000 mL | Freq: Once | INTRAMUSCULAR | Status: AC | PRN
  Administered 2022-01-13: 100 mL via INTRAVENOUS

## 2022-01-13 MED ORDER — ONDANSETRON 4 MG PO TBDP
4.0000 mg | ORAL_TABLET | Freq: Once | ORAL | Status: AC
  Administered 2022-01-13: 4 mg via ORAL
  Filled 2022-01-13: qty 1

## 2022-01-13 MED ORDER — FAMOTIDINE 20 MG PO TABS
20.0000 mg | ORAL_TABLET | Freq: Once | ORAL | Status: AC
Start: 2022-01-13 — End: 2022-01-13
  Administered 2022-01-13: 20 mg via ORAL
  Filled 2022-01-13: qty 1

## 2022-01-13 MED ORDER — HYDROMORPHONE HCL 1 MG/ML IJ SOLN
0.5000 mg | Freq: Once | INTRAMUSCULAR | Status: AC
  Administered 2022-01-13: 0.5 mg via INTRAVENOUS
  Filled 2022-01-13: qty 1

## 2022-01-13 MED ORDER — LACTATED RINGERS IV BOLUS
1000.0000 mL | Freq: Once | INTRAVENOUS | Status: AC
  Administered 2022-01-13: 1000 mL via INTRAVENOUS

## 2022-01-13 MED ORDER — AZITHROMYCIN 250 MG PO TABS
500.0000 mg | ORAL_TABLET | Freq: Every day | ORAL | Status: DC
  Administered 2022-01-13: 500 mg via ORAL
  Filled 2022-01-13: qty 2

## 2022-01-13 MED ORDER — FAMOTIDINE 20 MG PO TABS: 20.0000 mg | ORAL_TABLET | Freq: Two times a day (BID) | ORAL | 0 refills | Status: DC

## 2022-01-13 MED ORDER — ONDANSETRON 4 MG PO TBDP: 4.0000 mg | ORAL_TABLET | ORAL | 0 refills | Status: DC | PRN

## 2022-01-13 MED ORDER — ACETAMINOPHEN 500 MG PO TABS
1000.0000 mg | ORAL_TABLET | Freq: Once | ORAL | Status: AC
Start: 2022-01-13 — End: 2022-01-13
  Administered 2022-01-13: 1000 mg via ORAL
  Filled 2022-01-13: qty 2

## 2022-01-13 MED ORDER — METFORMIN HCL 500 MG PO TABS: 500.0000 mg | ORAL_TABLET | Freq: Two times a day (BID) | ORAL | 0 refills | Status: DC

## 2022-01-13 MED ORDER — AZITHROMYCIN 250 MG PO TABS: 250.0000 mg | ORAL_TABLET | Freq: Every day | ORAL | 0 refills | Status: DC

## 2022-01-13 MED ORDER — LACTATED RINGERS IV BOLUS
500.0000 mL | Freq: Once | INTRAVENOUS | Status: AC
  Administered 2022-01-13: 500 mL via INTRAVENOUS

## 2022-01-13 NOTE — ED Provider Notes (Signed)
[place] DEPT Provider Note   CSN: [id number] Arrival date & time: 01/13/22  1341     History  Chief Complaint  Patient presents with   Fever   Nausea    Regina Cantu is a 41 y.o. female.  HPI Patient reports she has been sick for about 2 days.  She has been having fevers.  She reports that fevers will go as high as 107.  She reports they just spiked very quickly and then go down again.  She reports that they do respond to Aleve but then come back after about 45 minutes to an hour.  Patient reports he has had general abdominal pain.  She did not had constipation or diarrhea but she has not had a bowel movement for several days.  Patient reports that she is had loss of appetite and nausea but has not been actively vomiting.  Reports when her temperature goes up it goes up quickly and she gets really sweaty and uncomfortable.  Patient denies pain or burning with urination.  She reports she has a tight sensation in her mid back.  She has complete hysterectomy and never took hormone replacement.  Patient reports every few years she will get a febrile illness that then usually resolves.  She initially assumed her more recent symptoms were due to food poisoning.  Is been no rashes or bites to the skin.  No joint swellings.  Patient denies any medical problems.  She denies she is taking any medications of any sort.    Home Medications Prior to Admission medications   Medication Sig Start Date End Date Taking? Authorizing Provider  azithromycin (ZITHROMAX) 250 MG tablet Take 1 tablet (250 mg total) by mouth daily. 01/13/22  Yes Charlesetta Shanks, MD  famotidine (PEPCID) 20 MG tablet Take 1 tablet (20 mg total) by mouth 2 (two) times daily. 01/13/22  Yes Charlesetta Shanks, MD  metFORMIN (GLUCOPHAGE) 500 MG tablet Take 1 tablet (500 mg total) by mouth 2 (two) times daily with a meal. 01/13/22  Yes Theresa Wedel, Jeannie Done, MD  ondansetron (ZOFRAN-ODT) 4 MG disintegrating  tablet Take 1 tablet (4 mg total) by mouth every 4 (four) hours as needed for nausea or vomiting. 01/13/22  Yes Charlesetta Shanks, MD  acetaminophen (TYLENOL) 325 MG tablet Take 2 tablets (650 mg total) by mouth every 6 (six) hours as needed for mild pain, moderate pain or fever (or Fever >/= 101). 09/25/18   Masoudi, Elhamalsadat, MD  doxycycline (VIBRA-TABS) 100 MG tablet Take 1 tablet (100 mg total) by mouth 2 (two) times daily. Patient taking differently: Take 100 mg by mouth 2 (two) times daily. For 7 days 09/18/18   Robyn Haber, MD  metFORMIN (GLUCOPHAGE-XR) 500 MG 24 hr tablet Take '500mg'$  daily with breakfast for one week, increase to '500mg'$  twice daily on week two, then '1000mg'$  twice daily on week 3 and continue 09/26/18   Katherine Roan, MD      Allergies    Penicillins    Review of Systems   Review of Systems Systems reviewed negative except as per HPI Physical Exam Updated Vital Signs BP (!) 173/91   Pulse 84   Temp 100.3 F (37.9 C) (Oral)   Resp 17   Ht '5\' 2"'$  (1.575 m)   Wt 98.9 kg   SpO2 100%   BMI 39.87 kg/m  Physical Exam Constitutional:      Comments: Patient is alert with clear mental status.  She is diaphoretic.  No respiratory  distress.  HENT:     Head: Normocephalic and atraumatic.     Mouth/Throat:     Mouth: Mucous membranes are moist.     Pharynx: Oropharynx is clear.  Eyes:     Extraocular Movements: Extraocular movements intact.     Conjunctiva/sclera: Conjunctivae normal.  Neck:     Comments: No appreciable thyromegaly Cardiovascular:     Comments: Borderline tachycardia no appreciable murmur gallop Pulmonary:     Effort: Pulmonary effort is normal.     Breath sounds: Normal breath sounds.  Abdominal:     Palpations: Abdomen is soft.     Comments: Abdomen soft, diffusely tender to palpation.  No guarding.  Musculoskeletal:        General: No swelling or tenderness. Normal range of motion.     Cervical back: Neck supple. No rigidity.      Right lower leg: No edema.     Left lower leg: No edema.  Lymphadenopathy:     Cervical: No cervical adenopathy.  Skin:    General: Skin is warm.     Comments: Skin is warm to the touch and diaphoretic  Neurological:     General: No focal deficit present.     Mental Status: She is oriented to person, place, and time.     Cranial Nerves: No cranial nerve deficit.     Motor: No weakness.     Coordination: Coordination normal.  Psychiatric:        Mood and Affect: Mood normal.     ED Results / Procedures / Treatments   Labs (all labs ordered are listed, but only abnormal results are displayed) Labs Reviewed  COMPREHENSIVE METABOLIC PANEL - Abnormal; Notable for the following components:      Result Value   Glucose, Bld 218 (*)    ALT 51 (*)    All other components within normal limits  CBC WITH DIFFERENTIAL/PLATELET - Abnormal; Notable for the following components:   WBC 13.0 (*)    RDW 11.4 (*)    Neutro Abs 9.8 (*)    Monocytes Absolute 1.2 (*)    Abs Immature Granulocytes 0.10 (*)    All other components within normal limits  URINALYSIS, ROUTINE W REFLEX MICROSCOPIC - Abnormal; Notable for the following components:   Color, Urine STRAW (*)    All other components within normal limits  SEDIMENTATION RATE - Abnormal; Notable for the following components:   Sed Rate 74 (*)    All other components within normal limits  PHOSPHORUS - Abnormal; Notable for the following components:   Phosphorus 1.6 (*)    All other components within normal limits  RESP PANEL BY RT-PCR (FLU A&B, COVID) ARPGX2  CULTURE, BLOOD (ROUTINE X 2)  CULTURE, BLOOD (ROUTINE X 2)  LACTIC ACID, PLASMA  LIPASE, BLOOD  PROTIME-INR  TSH  MAGNESIUM  LACTIC ACID, PLASMA  C-REACTIVE PROTEIN  LEGIONELLA PNEUMOPHILA SEROGP 1 UR AG  HEMOGLOBIN A1C  TROPONIN I (HIGH SENSITIVITY)  TROPONIN I (HIGH SENSITIVITY)    EKG EKG Interpretation  Date/Time:  Saturday January 13 2022 15:31:21 EDT Ventricular Rate:   95 PR Interval:  142 QRS Duration: 85 QT Interval:  333 QTC Calculation: 419 R Axis:   74 Text Interpretation: Sinus rhythm Consider anterior infarct no sig change from previous tracing Confirmed by Charlesetta Shanks [phone]) on 01/13/2022 5:53:48 PM  Radiology CT CHEST ABDOMEN PELVIS W CONTRAST  Result Date: 01/13/2022 CLINICAL DATA:  Sepsis EXAM: CT CHEST, ABDOMEN, AND PELVIS WITH CONTRAST TECHNIQUE: Multidetector  CT imaging of the chest, abdomen and pelvis was performed following the standard protocol during bolus administration of intravenous contrast. RADIATION DOSE REDUCTION: This exam was performed according to the departmental dose-optimization program which includes automated exposure control, adjustment of the mA and/or kV according to patient size and/or use of iterative reconstruction technique. CONTRAST:  190m OMNIPAQUE IOHEXOL 300 MG/ML  SOLN COMPARISON:  Chest radiograph done earlier today and CT abdomen done on 09/24/2018 FINDINGS: CT CHEST FINDINGS Cardiovascular: Unremarkable. Mediastinum/Nodes: There are slightly enlarged lymph nodes in the mediastinum. Lungs/Pleura: There is moderate to large alveolar infiltrate in the left upper lobe in the parahilar region and lingula. Rest of the lung fields are clear. There is no pleural effusion or pneumothorax. Musculoskeletal: No radiographic abnormality is seen in bony structures in the thorax. CT ABDOMEN PELVIS FINDINGS Hepatobiliary: No focal abnormality is seen in the liver. There is fatty infiltration in the liver. There is no dilation of bile ducts. Gallbladder is unremarkable. Pancreas: No focal abnormality is seen. Spleen: Unremarkable. Adrenals/Urinary Tract: Adrenals are not enlarged. There is no hydronephrosis. There is 12 mm fluid density lesion in the left kidney suggesting renal cyst. There is no perinephric fluid collection. Ureters are unremarkable. Urinary bladder is unremarkable. Stomach/Bowel: Stomach is not distended. Small  bowel loops are not dilated. Appendix is not dilated. There is no significant wall thickening in colon. There is no pericolic stranding. Vascular/Lymphatic: Scattered arterial calcifications are seen. Reproductive: Uterus appears smaller than usual, possibly suggesting partial hysterectomy. There are no adnexal masses. Other: There is no ascites or pneumoperitoneum. Musculoskeletal: Schmorl's node is seen in the upper endplate of body of L5 vertebra. IMPRESSION: There is moderate to large alveolar infiltrate in the left upper lobe consistent with pneumonia. There is no pleural effusion. There is no evidence of intestinal obstruction or pneumoperitoneum. There is no hydronephrosis. Appendix is not dilated. Other findings as described in the body of the report. Electronically Signed   By: PElmer PickerM.D.   On: 01/13/2022 16:16   DG Chest 2 View  Result Date: 01/13/2022 CLINICAL DATA:  Fever EXAM: CHEST - 2 VIEW COMPARISON:  01/07/2017 FINDINGS: Transverse diameter of heart is slightly increased. There is alveolar infiltrate in the left mid lung fields. The infiltrate appears to be anterior in the lateral projection suggesting pneumonia in the left upper lobe/lingula. There is no pleural effusion or pneumothorax. IMPRESSION: There is new alveolar infiltrate in the left mid lung fields suggesting left upper lobe pneumonia. Electronically Signed   By: PElmer PickerM.D.   On: 01/13/2022 15:27    Procedures Procedures    Medications Ordered in ED Medications  azithromycin (ZITHROMAX) tablet 500 mg (500 mg Oral Given 01/13/22 1730)  lactated ringers bolus 1,000 mL (0 mLs Intravenous Stopped 01/13/22 1724)  acetaminophen (TYLENOL) tablet 1,000 mg (1,000 mg Oral Given 01/13/22 1505)  HYDROmorphone (DILAUDID) injection 0.5 mg (0.5 mg Intravenous Given 01/13/22 1505)  iohexol (OMNIPAQUE) 300 MG/ML solution 100 mL (100 mLs Intravenous Contrast Given 01/13/22 1558)  lactated ringers bolus 500 mL (500  mLs Intravenous New Bag/Given 01/13/22 1743)  famotidine (PEPCID) tablet 20 mg (20 mg Oral Given 01/13/22 1730)  ondansetron (ZOFRAN-ODT) disintegrating tablet 4 mg (4 mg Oral Given 01/13/22 1731)    ED Course/ Medical Decision Making/ A&P                           Medical Decision Making Amount and/or Complexity of Data Reviewed Labs:  ordered. Radiology: ordered.  Risk OTC drugs. Prescription drug management.   Patient presents with febrile illness for several days.  Predominant symptom is abdominal pain.  Patient is also diaphoretic and having sporadic fevers.  Patient denies significant past medical history but review of EMR indicates she does have history of ovarian cancer and complete hysterectomy.  First treated on a unilateral side in 1999, then complete hysterectomy in 2010.  Patient had elevated blood sugar during hospitalization for cellulitis in 2020.  At that time she was discharged on metformin.  Patient reports she was not actually diabetic and stopped taking metformin.  Patient has had fever and abdominal pain.  She is diaphoretic.  Will start with broad diagnostic evaluation for possible surgical intra-abdominal etiology such as appendicitis\diverticulitis\other colitis or gastroenteritis\UTI or pyelonephritis.  Other infectious considerations for possibly COVID\atypical pneumonia.  Will initiate fluid resuscitation and Tylenol for fever.  Will await return of some diagnostic studies to determine if antibiotics are indicated or admission.  Chest x-ray returns with infiltrate present.  Will add CT of the abdomen and chest to further define abdominal pain and now identified infiltrate on chest x-ray.  CT scan was visually reviewed by myself as well as reviewed radiology results.  There is a dense consolidation in the left lung.  At this time it appears patient's symptoms are due to pneumonia.  She does have abdominal discomfort but does not have guarding and no acute findings on  the CT for intra-abdominal pathology.  Patient is nontoxic.  She has been hydrated.  Her mental status is clear.  Patient is not exhibiting any respiratory distress.  She does not have hypotension or tachycardia to suggest sepsis at this time.  Patient is appropriate for out patient management.  We reviewed her allergies to penicillin and patient reports her allergy is fairly severe.  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  At this time will opt to go with Zithromax alone.  She reports she has tolerated this well in the past.  Due to patient's report of significant fevers we will also obtain blood cultures prior to discharge to rule out bacteremia.  However clinically patient is in good condition and would like to start treatment outpatient with understanding that she needs to return immediately if her symptoms are worsening.  Patient has not been treated for her diabetes for several years.  She was under the impression that she did not have diabetes.  I reviewed her A1c from 2 years ago and noted it was high then.  Patient certainly has diabetes although at this time no signs of acute complications.  She had previously been on metformin.  She is counseled to resume metformin 48 hours after her visit due to having had CT contrast material.  Patient is aware that it is very important at this time for her to start primary care for management of diabetes and hypertension.        Final Clinical Impression(s) / ED Diagnoses Final diagnoses:  Community acquired pneumonia of left upper lobe of lung    Rx / DC Orders ED Discharge Orders          Ordered    metFORMIN (GLUCOPHAGE) 500 MG tablet  2 times daily with meals        01/13/22 1730    azithromycin (ZITHROMAX) 250 MG tablet  Daily        01/13/22 1749    ondansetron (ZOFRAN-ODT)  4 MG disintegrating tablet  Every 4 hours PRN        01/13/22 1749    famotidine  (PEPCID) 20 MG tablet  2 times daily        01/13/22 1749              Charlesetta Shanks, MD 01/13/22 1759

## 2022-01-13 NOTE — ED Triage Notes (Signed)
Patient reports nausea and fever of 107.0 x 2 days. Patient assured this nurse the temperature of 107.0. patient says she has had these fevers in the past and had heat stroke and exhaustion. Pain rated 10/10 all over.

## 2022-01-13 NOTE — Discharge Instructions (Addendum)
1.  Start metformin 48 hours after your emergency department visit.  Stay well-hydrated.  It is very important that you get established with a family physician to monitor your diabetes and tailor your treatment.  Blood pressure is also elevated in the emergency department.  This needs to be monitored and you need to be treated for hypertension if your blood pressures remain elevated. 2.  You were given a dose of Zithromax in the emergency department.  Continue your prescription tomorrow evening. 3.  You need a recheck with the family physician, ideally within 2 to 4 days. 4.  You have pneumonia.  Return to emergency department immediately if you are having any worsening or new concerning symptoms.

## 2022-01-16 LAB — LEGIONELLA PNEUMOPHILA SEROGP 1 UR AG: L. pneumophila Serogp 1 Ur Ag: NEGATIVE

## 2022-01-18 LAB — CULTURE, BLOOD (ROUTINE X 2)
Culture: NO GROWTH
Culture: NO GROWTH
Special Requests: ADEQUATE
Special Requests: ADEQUATE

## 2022-05-04 ENCOUNTER — Emergency Department (HOSPITAL_COMMUNITY): Payer: Self-pay

## 2022-05-04 ENCOUNTER — Encounter (HOSPITAL_COMMUNITY): Payer: Self-pay | Admitting: Emergency Medicine

## 2022-05-04 ENCOUNTER — Other Ambulatory Visit: Payer: Self-pay

## 2022-05-04 ENCOUNTER — Observation Stay (HOSPITAL_COMMUNITY)
Admission: EM | Admit: 2022-05-04 | Discharge: 2022-05-05 | Disposition: A | Discharge status: Home / Self Care | Payer: Self-pay | Attending: Internal Medicine | Admitting: Internal Medicine

## 2022-05-04 DIAGNOSIS — E669 Obesity, unspecified: Secondary | ICD-10-CM | POA: Insufficient documentation

## 2022-05-04 DIAGNOSIS — Z9889 Other specified postprocedural states: Secondary | ICD-10-CM | POA: Insufficient documentation

## 2022-05-04 DIAGNOSIS — I639 Cerebral infarction, unspecified: Principal | ICD-10-CM | POA: Diagnosis present

## 2022-05-04 DIAGNOSIS — Z8543 Personal history of malignant neoplasm of ovary: Secondary | ICD-10-CM

## 2022-05-04 DIAGNOSIS — I10 Essential (primary) hypertension: Secondary | ICD-10-CM

## 2022-05-04 DIAGNOSIS — Z79899 Other long term (current) drug therapy: Secondary | ICD-10-CM | POA: Insufficient documentation

## 2022-05-04 DIAGNOSIS — Z6839 Body mass index (BMI) 39.0-39.9, adult: Secondary | ICD-10-CM | POA: Insufficient documentation

## 2022-05-04 DIAGNOSIS — Z7984 Long term (current) use of oral hypoglycemic drugs: Secondary | ICD-10-CM | POA: Insufficient documentation

## 2022-05-04 DIAGNOSIS — E1165 Type 2 diabetes mellitus with hyperglycemia: Secondary | ICD-10-CM

## 2022-05-04 DIAGNOSIS — I671 Cerebral aneurysm, nonruptured: Secondary | ICD-10-CM | POA: Insufficient documentation

## 2022-05-04 DIAGNOSIS — F17210 Nicotine dependence, cigarettes, uncomplicated: Secondary | ICD-10-CM | POA: Insufficient documentation

## 2022-05-04 DIAGNOSIS — F1290 Cannabis use, unspecified, uncomplicated: Secondary | ICD-10-CM | POA: Insufficient documentation

## 2022-05-04 DIAGNOSIS — E119 Type 2 diabetes mellitus without complications: Secondary | ICD-10-CM

## 2022-05-04 DIAGNOSIS — Z8673 Personal history of transient ischemic attack (TIA), and cerebral infarction without residual deficits: Secondary | ICD-10-CM | POA: Diagnosis present

## 2022-05-04 DIAGNOSIS — Z20822 Contact with and (suspected) exposure to covid-19: Secondary | ICD-10-CM | POA: Insufficient documentation

## 2022-05-04 HISTORY — DX: Type 2 diabetes mellitus with hyperglycemia: E11.65

## 2022-05-04 HISTORY — DX: Essential (primary) hypertension: I10

## 2022-05-04 LAB — CBC
HCT: 46 % (ref 36.0–46.0)
Hemoglobin: 15.9 g/dL — ABNORMAL HIGH (ref 12.0–15.0)
MCH: 29.3 pg (ref 26.0–34.0)
MCHC: 34.6 g/dL (ref 30.0–36.0)
MCV: 84.7 fL (ref 80.0–100.0)
Platelets: 340 10*3/uL (ref 150–400)
RBC: 5.43 MIL/uL — ABNORMAL HIGH (ref 3.87–5.11)
RDW: 11.9 % (ref 11.5–15.5)
WBC: 10.5 10*3/uL (ref 4.0–10.5)
nRBC: 0 % (ref 0.0–0.2)

## 2022-05-04 LAB — RESP PANEL BY RT-PCR (FLU A&B, COVID) ARPGX2
Influenza A by PCR: NEGATIVE
Influenza B by PCR: NEGATIVE
SARS Coronavirus 2 by RT PCR: NEGATIVE

## 2022-05-04 LAB — DIFFERENTIAL
Abs Immature Granulocytes: 0.06 10*3/uL (ref 0.00–0.07)
Basophils Absolute: 0 10*3/uL (ref 0.0–0.1)
Basophils Relative: 0 %
Eosinophils Absolute: 0.1 10*3/uL (ref 0.0–0.5)
Eosinophils Relative: 1 %
Immature Granulocytes: 1 %
Lymphocytes Relative: 42 %
Lymphs Abs: 4.4 10*3/uL — ABNORMAL HIGH (ref 0.7–4.0)
Monocytes Absolute: 0.7 10*3/uL (ref 0.1–1.0)
Monocytes Relative: 7 %
Neutro Abs: 5.2 10*3/uL (ref 1.7–7.7)
Neutrophils Relative %: 49 %

## 2022-05-04 LAB — URINALYSIS, ROUTINE W REFLEX MICROSCOPIC
Bilirubin Urine: NEGATIVE
Glucose, UA: 50 mg/dL — AB
Hgb urine dipstick: NEGATIVE
Ketones, ur: NEGATIVE mg/dL
Nitrite: NEGATIVE
Protein, ur: NEGATIVE mg/dL
Specific Gravity, Urine: 1.023 (ref 1.005–1.030)
pH: 5 (ref 5.0–8.0)

## 2022-05-04 LAB — COMPREHENSIVE METABOLIC PANEL
ALT: 31 U/L (ref 0–44)
AST: 19 U/L (ref 15–41)
Albumin: 4.5 g/dL (ref 3.5–5.0)
Alkaline Phosphatase: 87 U/L (ref 38–126)
Anion gap: 9 (ref 5–15)
BUN: 16 mg/dL (ref 6–20)
CO2: 26 mmol/L (ref 22–32)
Calcium: 9.6 mg/dL (ref 8.9–10.3)
Chloride: 99 mmol/L (ref 98–111)
Creatinine, Ser: 0.67 mg/dL (ref 0.44–1.00)
GFR, Estimated: >60 mL/min (ref >60)
Glucose, Bld: 230 mg/dL — ABNORMAL HIGH (ref 70–99)
Potassium: 3.5 mmol/L (ref 3.5–5.1)
Sodium: 134 mmol/L — ABNORMAL LOW (ref 135–145)
Total Bilirubin: 0.5 mg/dL (ref 0.3–1.2)
Total Protein: 7.9 g/dL (ref 6.5–8.1)

## 2022-05-04 LAB — RAPID URINE DRUG SCREEN, HOSP PERFORMED
Amphetamines: NOT DETECTED
Barbiturates: NOT DETECTED
Benzodiazepines: NOT DETECTED
Cocaine: NOT DETECTED
Opiates: NOT DETECTED
Tetrahydrocannabinol: POSITIVE — AB

## 2022-05-04 LAB — I-STAT CHEM 8, ED
BUN: 18 mg/dL (ref 6–20)
Calcium, Ion: 1.15 mmol/L (ref 1.15–1.40)
Chloride: 97 mmol/L — ABNORMAL LOW (ref 98–111)
Creatinine, Ser: 0.5 mg/dL (ref 0.44–1.00)
Glucose, Bld: 236 mg/dL — ABNORMAL HIGH (ref 70–99)
HCT: 47 % — ABNORMAL HIGH (ref 36.0–46.0)
Hemoglobin: 16 g/dL — ABNORMAL HIGH (ref 12.0–15.0)
Potassium: 4 mmol/L (ref 3.5–5.1)
Sodium: 133 mmol/L — ABNORMAL LOW (ref 135–145)
TCO2: 26 mmol/L (ref 22–32)

## 2022-05-04 LAB — APTT: aPTT: 26 seconds (ref 24–36)

## 2022-05-04 LAB — I-STAT BETA HCG BLOOD, ED (MC, WL, AP ONLY): I-stat hCG, quantitative: <5 m[IU]/mL (ref <5)

## 2022-05-04 LAB — CBG MONITORING, ED: Glucose-Capillary: 258 mg/dL — ABNORMAL HIGH (ref 70–99)

## 2022-05-04 LAB — PROTIME-INR
INR: 0.9 (ref 0.8–1.2)
Prothrombin Time: 12.5 seconds (ref 11.4–15.2)

## 2022-05-04 MED ORDER — STROKE: EARLY STAGES OF RECOVERY BOOK
Freq: Once | Status: AC
  Filled 2022-05-04 (×2): qty 1

## 2022-05-04 MED ORDER — ACETAMINOPHEN 650 MG RE SUPP: 650.0000 mg | RECTAL | Status: DC | PRN

## 2022-05-04 MED ORDER — ONDANSETRON HCL 4 MG/2ML IJ SOLN: 4.0000 mg | Freq: Four times a day (QID) | INTRAMUSCULAR | Status: DC | PRN

## 2022-05-04 MED ORDER — ASPIRIN 81 MG PO CHEW
81.0000 mg | CHEWABLE_TABLET | Freq: Every day | ORAL | Status: DC
  Administered 2022-05-04 – 2022-05-05 (×2): 81 mg via ORAL
  Filled 2022-05-04 (×2): qty 1

## 2022-05-04 MED ORDER — OXYCODONE HCL 5 MG PO TABS
5.0000 mg | ORAL_TABLET | Freq: Once | ORAL | Status: AC
  Administered 2022-05-04: 5 mg via ORAL
  Filled 2022-05-04: qty 1

## 2022-05-04 MED ORDER — ACETAMINOPHEN 325 MG PO TABS
650.0000 mg | ORAL_TABLET | ORAL | Status: DC | PRN
  Administered 2022-05-05: 650 mg via ORAL
  Filled 2022-05-04: qty 2

## 2022-05-04 MED ORDER — ACETAMINOPHEN 160 MG/5ML PO SOLN: 650.0000 mg | ORAL | Status: DC | PRN

## 2022-05-04 MED ORDER — ASPIRIN 300 MG RE SUPP: 300.0000 mg | Freq: Every day | RECTAL | Status: DC

## 2022-05-04 MED ORDER — MELATONIN 5 MG PO TABS: 10.0000 mg | ORAL_TABLET | Freq: Every evening | ORAL | Status: DC | PRN

## 2022-05-04 MED ORDER — HEPARIN SODIUM (PORCINE) 5000 UNIT/ML IJ SOLN
5000.0000 [IU] | Freq: Three times a day (TID) | INTRAMUSCULAR | Status: DC
  Administered 2022-05-05: 5000 [IU] via SUBCUTANEOUS
  Filled 2022-05-04: qty 1

## 2022-05-04 MED ORDER — IOHEXOL 350 MG/ML SOLN
75.0000 mL | Freq: Once | INTRAVENOUS | Status: AC | PRN
  Administered 2022-05-04: 75 mL via INTRAVENOUS

## 2022-05-04 MED ORDER — CLOPIDOGREL BISULFATE 75 MG PO TABS
75.0000 mg | ORAL_TABLET | Freq: Every day | ORAL | Status: DC
  Administered 2022-05-04 – 2022-05-05 (×2): 75 mg via ORAL
  Filled 2022-05-04 (×2): qty 1

## 2022-05-04 NOTE — Assessment & Plan Note (Signed)
Stable

## 2022-05-04 NOTE — Assessment & Plan Note (Signed)
Admit to observation telemetry bed at [facility].  Neurology consult on arrival to [facility].  CTA ordered by neurology.  MRI shows right basal ganglia stroke.  Check bubble echo.  Check lipid panel.  Start statin.  Continue with aspirin and Plavix per neurology recommendations.  Will obtain PT, OT, speech consults.  Subcu heparin for DVT prophylaxis.

## 2022-05-04 NOTE — H&P (Signed)
History and Physical    Regina Cantu UJW:[id number] DOB: July 27, 1981 DOA: 05/04/2022  DOS: the patient was seen and examined on 05/04/2022  PCP: Patient, No Pcp Per   Patient coming from: Home  I have personally briefly reviewed patient's old medical records in [place]  Chief complaint: Facial droop and dysarthria. History present illness: 41 year old African-American female prior history of ovarian cancer status post hysterectomy presents to the ER today with sudden onset of slurred speech, facial numbness and dysarthria starting around 10:30 AM.  She was at work at the time.  She noticed that she was unable to talk.  She noticed that she had some slurred speech.  She noticed that she had facial numbness including her tongue.  Her symptoms initially resolved but then recurred while she was waiting in the ER waiting room.  MRI performed demonstrated a right basal ganglia stroke and a stroke in the right centrum semiovale.  Neurology consulted.  CT angio head and neck were ordered.  [facility] hospital[facility] contacted for admission.  Patient states that she does not have a primary care doctor.  Her hypertension and her diabetes have been untreated for many years due to lack of access to healthcare.    ED Course: MRI shows right basal ganglia stroke, stroke of the right centrum semiovale.  Review of Systems:  Review of Systems  Neurological:  Positive for tingling, speech change and focal weakness.       Left facial numbness,    Past Medical History:  Diagnosis Date   Cancer ([place])    ovarian 2001 and 2010   Essential hypertension 05/04/2022   Hypertension    Migraine    Pityriasis rosea    Uncontrolled type 2 diabetes mellitus with hyperglycemia ([place]) 05/04/2022    Past Surgical History:  Procedure Laterality Date   ABDOMINAL HYSTERECTOMY     OVARIAN CANCER       reports that she has been smoking cigarettes. She has been smoking an average of .25 packs per day. She  has never used smokeless tobacco. She reports current alcohol use of about 14.0 standard drinks of alcohol per week. She reports that she does not use drugs.  Allergies  Allergen Reactions   Penicillins Hives, Nausea And Vomiting and Rash    Family History  Problem Relation Age of Onset   Hyperlipidemia Mother    Hypertension Mother    Diabetes Mother    Stroke Mother    Cerebral aneurysm Mother    Cerebral aneurysm Father    Cancer Father    Stroke Father    Breast cancer Maternal Aunt 65    Prior to Admission medications   Medication Sig Start Date End Date Taking? Authorizing Provider  acetaminophen (TYLENOL) 325 MG tablet Take 2 tablets (650 mg total) by mouth every 6 (six) hours as needed for mild pain, moderate pain or fever (or Fever >/= 101). 09/25/18   Masoudi, Elhamalsadat, MD  azithromycin (ZITHROMAX) 250 MG tablet Take 1 tablet (250 mg total) by mouth daily. 01/13/22   Charlesetta Shanks, MD  doxycycline (VIBRA-TABS) 100 MG tablet Take 1 tablet (100 mg total) by mouth 2 (two) times daily. Patient taking differently: Take 100 mg by mouth 2 (two) times daily. For 7 days 09/18/18   Robyn Haber, MD  famotidine (PEPCID) 20 MG tablet Take 1 tablet (20 mg total) by mouth 2 (two) times daily. 01/13/22   Charlesetta Shanks, MD  metFORMIN (GLUCOPHAGE) 500 MG tablet Take 1 tablet (500 mg  total) by mouth 2 (two) times daily with a meal. 01/13/22   Charlesetta Shanks, MD  metFORMIN (GLUCOPHAGE-XR) 500 MG 24 hr tablet Take '500mg'$  daily with breakfast for one week, increase to '500mg'$  twice daily on week two, then '1000mg'$  twice daily on week 3 and continue 09/26/18   Katherine Roan, MD  ondansetron (ZOFRAN-ODT) 4 MG disintegrating tablet Take 1 tablet (4 mg total) by mouth every 4 (four) hours as needed for nausea or vomiting. 01/13/22   Charlesetta Shanks, MD    Physical Exam: Vitals:   05/04/22 1704 05/04/22 1821 05/04/22 1900  BP: (!) 188/102 (!) 127/94 (!) 167/104  Pulse: 90 88 92  Resp: '18  18 19  '$ Temp: 98 F (36.7 C) 97.9 F (36.6 C)   TempSrc: Oral Oral   SpO2: 95% 100% 100%    Physical Exam Vitals and nursing note reviewed.  Constitutional:      General: She is not in acute distress.    Appearance: Normal appearance. She is obese. She is not ill-appearing, toxic-appearing or diaphoretic.  HENT:     Head: Normocephalic and atraumatic.     Nose: Nose normal.  Eyes:     General: No scleral icterus. Cardiovascular:     Rate and Rhythm: Normal rate and regular rhythm.  Pulmonary:     Effort: Pulmonary effort is normal.     Breath sounds: Normal breath sounds.  Abdominal:     General: Abdomen is protuberant. Bowel sounds are normal. There is no distension.     Palpations: Abdomen is soft.     Tenderness: There is no abdominal tenderness. There is no guarding or rebound.  Musculoskeletal:     Right lower leg: No edema.  Skin:    General: Skin is warm and dry.     Capillary Refill: Capillary refill takes less than 2 seconds.  Neurological:     Mental Status: She is alert and oriented to person, place, and time.     Cranial Nerves: Dysarthria and facial asymmetry present.     Coordination: Romberg sign positive. Finger-Nose-Finger Test and Heel to Hazel Test normal.     Comments: Droop of left corner of mouth Mild dysarthria      Labs on Admission: I have personally reviewed following labs and imaging studies  CBC: Recent Labs  Lab 05/04/22 1442 05/04/22 1523  WBC 10.5  --   NEUTROABS 5.2  --   HGB 15.9* 16.0*  HCT 46.0 47.0*  MCV 84.7  --   PLT 340  --    Basic Metabolic Panel: Recent Labs  Lab 05/04/22 1442 05/04/22 1523  NA 134* 133*  K 3.5 4.0  CL 99 97*  CO2 26  --   GLUCOSE 230* 236*  BUN 16 18  CREATININE 0.67 0.50  CALCIUM 9.6  --    GFR: CrCl cannot be calculated (Unknown ideal weight.). Liver Function Tests: Recent Labs  Lab 05/04/22 1442  AST 19  ALT 31  ALKPHOS 87  BILITOT 0.5  PROT 7.9  ALBUMIN 4.5   No results for  input(s): "LIPASE", "AMYLASE" in the last 168 hours. No results for input(s): "AMMONIA" in the last 168 hours. Coagulation Profile: Recent Labs  Lab 05/04/22 1442  INR 0.9   Cardiac Enzymes: No results for input(s): "CKTOTAL", "CKMB", "CKMBINDEX", "TROPONINI", "TROPONINIHS" in the last 168 hours. BNP (last 3 results) No results for input(s): "PROBNP" in the last 8760 hours. HbA1C: No results for input(s): "HGBA1C" in the last 72 hours. CBG:  Recent Labs  Lab 05/04/22 1440  GLUCAP 258*   Lipid Profile: No results for input(s): "CHOL", "HDL", "LDLCALC", "TRIG", "CHOLHDL", "LDLDIRECT" in the last 72 hours. Thyroid Function Tests: No results for input(s): "TSH", "T4TOTAL", "FREET4", "T3FREE", "THYROIDAB" in the last 72 hours. Anemia Panel: No results for input(s): "VITAMINB12", "FOLATE", "FERRITIN", "TIBC", "IRON", "RETICCTPCT" in the last 72 hours. Urine analysis:    Component Value Date/Time   COLORURINE YELLOW 05/04/2022 1514   APPEARANCEUR CLEAR 05/04/2022 1514   LABSPEC 1.023 05/04/2022 1514   PHURINE 5.0 05/04/2022 1514   GLUCOSEU 50 (A) 05/04/2022 1514   HGBUR NEGATIVE 05/04/2022 1514   BILIRUBINUR NEGATIVE 05/04/2022 1514   KETONESUR NEGATIVE 05/04/2022 1514   PROTEINUR NEGATIVE 05/04/2022 1514   NITRITE NEGATIVE 05/04/2022 1514   LEUKOCYTESUR TRACE (A) 05/04/2022 1514    Radiological Exams on Admission: I have personally reviewed images MR BRAIN WO CONTRAST  Result Date: 05/04/2022 CLINICAL DATA:  Neuro deficit, acute, stroke suspected. Left facial paresthesias. EXAM: MRI HEAD WITHOUT CONTRAST TECHNIQUE: Multiplanar, multiecho pulse sequences of the brain and surrounding structures were obtained without intravenous contrast. COMPARISON:  None Available. FINDINGS: Brain: There is a small acute right basal ganglia infarct involving the posterior aspect of the right lentiform nucleus and caudate body, and there is also a small acute or early subacute white matter infarct  in the right centrum semiovale. No significant chronic white matter disease is evident. No intracranial hemorrhage, mass, midline shift, or extra-axial fluid collection is identified. The ventricles and sulci are normal. Vascular: Major intracranial vascular flow voids are preserved. Skull and upper cervical spine: No suspicious marrow lesion. Sinuses/Orbits: Unremarkable orbits. Small mucous retention cyst in the right maxillary sinus. No significant mastoid fluid. Other: None. IMPRESSION: Small acute infarcts in the right basal ganglia and right centrum semiovale. Electronically Signed   By: Logan Bores M.D.   On: 05/04/2022 17:02    EKG: My personal interpretation of EKG shows: NSR    Assessment/Plan Principal Problem:   CVA (cerebral vascular accident) ([place]) Active Problems:   Uncontrolled type 2 diabetes mellitus with hyperglycemia ([place])   History of ovarian cancer   Essential hypertension    Assessment and Plan: * CVA (cerebral vascular accident) ([place]) Admit to observation telemetry bed at [facility].  Neurology consult on arrival to [facility].  CTA ordered by neurology.  MRI shows right basal ganglia stroke.  Check bubble echo.  Check lipid panel.  Start statin.  Continue with aspirin and Plavix per neurology recommendations.  Will obtain PT, OT, speech consults.  Subcu heparin for DVT prophylaxis.  Uncontrolled type 2 diabetes mellitus with hyperglycemia ([place]) Patient has been untreated for her diabetes for several years now.  Check A1c.  Add sliding scale insulin.  She will need a new PCP at discharge.  She does not currently have a primary care doctor.  Essential hypertension Allow for permissive hypertension for now.  Keep systolic blood pressure less than 638 and diastolic less than 756.  History of ovarian cancer Stable.   DVT prophylaxis: SQ Heparin Code Status: Full Code Family Communication: discussed with pt and significant other Jose  Disposition Plan: return home   Consults called: EDP has consulted Dr. Lorrin Goodell  Admission status: Observation, Telemetry bed   Kristopher Oppenheim, DO [facility] 05/04/2022, 7:42 PM

## 2022-05-04 NOTE — ED Provider Triage Note (Signed)
Emergency Medicine Provider Triage Evaluation Note  Jacqueline Chasady Longwell , a 41 y.o. female  was evaluated in triage.  With history of ovarian cancer in remission, migraines, hypertension, hyperlipidemia, diabetes.    Pt complains of left facial paresthesias that began around 930 today.  She reports she was at work when this began, patient reports symptoms have been waxing and waning since onset, patient feels like the left corner of her mouth may have been drooping but that has resolved.  She denies similar symptoms in the past.  Review of Systems  Positive: Left facial paresthesias Negative: Vision change, extremity weakness/numbness, nausea, vomiting, diarrhea, chest pain or any additional concerns  Physical Exam  There were no vitals taken for this visit. Gen:   Awake, no distress   Resp:  Normal effort  MSK:   Moves extremities without difficulty  Other:    Mental Status:  Alert, oriented, thought content appropriate, able to give a coherent history. Speech fluent without evidence of aphasia. Able to follow 2 step commands without difficulty.  Cranial Nerves:  II:  Peripheral visual fields grossly normal, pupils equal, round, reactive to light III,IV, VI: ptosis not present, extra-ocular motions intact bilaterally  V,VII: smile symmetric, eyebrows raise symmetric, patient reports intact but diminished sensation in all distributions of the left side of face VIII: hearing grossly normal to voice  X: uvula elevates symmetrically  XI: bilateral shoulder shrug symmetric and strong XII: midline tongue extension without fassiculations Motor:  Normal tone. 5/5 strength in upper and lower extremities bilaterally including strong and equal grip strength and dorsiflexion/plantar flexion Sensory: Sensation intact to light touch in all extremities. Negative Romberg.  Cerebellar: normal finger-to-nose with bilateral upper extremities. Normal heel-to -shin balance bilaterally of the lower  extremity. No pronator drift.  Gait: normal gait and balance  Medical Decision Making  Medically screening exam initiated at 2:38 PM.  Appropriate orders placed.  Nate Sarea Fyfe was informed that the remainder of the evaluation will be completed by another provider, this initial triage assessment does not replace that evaluation, and the importance of remaining in the ED until their evaluation is complete.  Symptom onset 930-10 AM, she is over 4.5 hours out since symptom onset only symptom at this time is slightly diminished sensation in all distributions of the left side of face.  Otherwise neurologic examination is within normal limits.  The case with Dr. Roderic Palau, plan will be to obtain blood work and MRI of the brain without contrast for further evaluation.  Low suspicion for LVO.   Note: Portions of this report may have been transcribed using voice recognition software. Every effort was made to ensure accuracy; however, inadvertent computerized transcription errors may still be present.    Deliah Boston, PA-C 05/04/22 1446

## 2022-05-04 NOTE — ED Triage Notes (Signed)
Pt reports facial numbness on the left since about 10 a.m.  Pt states she feels "like it dropped" NIH negative on exam in triage.  EDPA at bedside to eval.

## 2022-05-04 NOTE — Assessment & Plan Note (Signed)
Allow for permissive hypertension for now.  Keep systolic blood pressure less than 069 and diastolic less than 861.

## 2022-05-04 NOTE — Plan of Care (Signed)
Brief Neuro Note:  Regina Cantu is a 41 y.o. female p/w L facial numbness  and slurred speech since 10AM on 05/04/22. Symptoms resolved then recurred and she is in the waiting room at [facility]. MRI Brain demonstrated a right basal ganglia and right centrum semiovale stroke.  Recs:  - Frequent Neuro checks per stroke unit protocol - Recommend Vascular imaging with CT angio head and neck - Recommend obtaining TTE - Recommend obtaining Lipid panel with LDL - Please start statin if LDL > 70 - Recommend HbA1c - Antithrombotic - Aspirin '81mg'$  daily along with plavix '75mg'$  daily x 21days, followed by Aspirin '81mg'$  daily alone. - Recommend DVT ppx - SBP goal - permissive hypertension first 24 h < 220/110. Held home meds.  - Recommend Telemetry monitoring for arrythmia - Recommend bedside swallow screen prior to PO intake. - Stroke education booklet - Recommend PT/OT/SLP consult - Recommend Urine Tox screen. - prefer admission to [facility] but if she is still at [facility], will see her tomorrow.  [place] Pager Number [phone]

## 2022-05-04 NOTE — Subjective & Objective (Signed)
Chief complaint: Facial droop and dysarthria. History present illness: 41 year old African-American female prior history of ovarian cancer status post hysterectomy presents to the ER today with sudden onset of slurred speech, facial numbness and dysarthria starting around 10:30 AM.  She was at work at the time.  She noticed that she was unable to talk.  She noticed that she had some slurred speech.  She noticed that she had facial numbness including her tongue.  Her symptoms initially resolved but then recurred while she was waiting in the ER waiting room.  MRI performed demonstrated a right basal ganglia stroke and a stroke in the right centrum semiovale.  Neurology consulted.  CT angio head and neck were ordered.  [facility] hospitalist contacted for admission.  Patient states that she does not have a primary care doctor.  Her hypertension and her diabetes have been untreated for many years due to lack of access to healthcare.

## 2022-05-04 NOTE — ED Provider Notes (Signed)
[facility] DEPT Provider Note   CSN: [id number] Arrival date & time: 05/04/22  1430     History {Add pertinent medical, surgical, social history, OB history to HPI:1} Chief Complaint  Patient presents with   Facial numbness    Regina Cantu is a 41 y.o. female.  She has a history of diabetes.  She said around 1030 this morning she noticed some numbness of her left face and feeling like it may be a little droop.  She was having some difficulty with speech.  This resolved after period of time but came back again around noon.  She feels that is resolved now.  She has never had this before.  No headache blurry vision double vision.  She does have a family history of strokes.  She denies any recent illness.  The history is provided by the patient and the spouse.  Cerebrovascular Accident This is a new problem. The current episode started 6 to 12 hours ago. Episode frequency: intermittent. Pertinent negatives include no chest pain, no abdominal pain, no headaches and no shortness of breath. Associated symptoms comments: Left face numbness speech difficulty. Nothing aggravates the symptoms. Nothing relieves the symptoms. She has tried nothing for the symptoms. The treatment provided significant relief.       Home Medications Prior to Admission medications   Medication Sig Start Date End Date Taking? Authorizing Provider  acetaminophen (TYLENOL) 325 MG tablet Take 2 tablets (650 mg total) by mouth every 6 (six) hours as needed for mild pain, moderate pain or fever (or Fever >/= 101). 09/25/18   Masoudi, Elhamalsadat, MD  azithromycin (ZITHROMAX) 250 MG tablet Take 1 tablet (250 mg total) by mouth daily. 01/13/22   Charlesetta Shanks, MD  doxycycline (VIBRA-TABS) 100 MG tablet Take 1 tablet (100 mg total) by mouth 2 (two) times daily. Patient taking differently: Take 100 mg by mouth 2 (two) times daily. For 7 days 09/18/18   Robyn Haber, MD  famotidine  (PEPCID) 20 MG tablet Take 1 tablet (20 mg total) by mouth 2 (two) times daily. 01/13/22   Charlesetta Shanks, MD  metFORMIN (GLUCOPHAGE) 500 MG tablet Take 1 tablet (500 mg total) by mouth 2 (two) times daily with a meal. 01/13/22   Charlesetta Shanks, MD  metFORMIN (GLUCOPHAGE-XR) 500 MG 24 hr tablet Take '500mg'$  daily with breakfast for one week, increase to '500mg'$  twice daily on week two, then '1000mg'$  twice daily on week 3 and continue 09/26/18   Katherine Roan, MD  ondansetron (ZOFRAN-ODT) 4 MG disintegrating tablet Take 1 tablet (4 mg total) by mouth every 4 (four) hours as needed for nausea or vomiting. 01/13/22   Charlesetta Shanks, MD      Allergies    Penicillins    Review of Systems   Review of Systems  Constitutional:  Negative for fever.  HENT:  Negative for sore throat.   Eyes:  Negative for visual disturbance.  Respiratory:  Negative for shortness of breath.   Cardiovascular:  Negative for chest pain.  Gastrointestinal:  Negative for abdominal pain.  Musculoskeletal:  Negative for neck pain.  Skin:  Negative for rash.  Neurological:  Positive for speech difficulty, weakness and numbness. Negative for headaches.    Physical Exam Updated Vital Signs BP (!) 127/94 (BP Location: Left Arm)   Pulse 88   Temp 97.9 F (36.6 C) (Oral)   Resp 18   SpO2 100%  Physical Exam Vitals and nursing note reviewed.  Constitutional:  General: She is not in acute distress.    Appearance: Normal appearance. She is well-developed.  HENT:     Head: Normocephalic and atraumatic.  Eyes:     Conjunctiva/sclera: Conjunctivae normal.  Cardiovascular:     Rate and Rhythm: Normal rate and regular rhythm.     Heart sounds: No murmur heard. Pulmonary:     Effort: Pulmonary effort is normal. No respiratory distress.     Breath sounds: Normal breath sounds.  Abdominal:     Palpations: Abdomen is soft.     Tenderness: There is no abdominal tenderness. There is no guarding or rebound.   Musculoskeletal:        General: No swelling.     Cervical back: Neck supple.  Skin:    General: Skin is warm and dry.     Capillary Refill: Capillary refill takes less than 2 seconds.  Neurological:     Mental Status: She is alert and oriented to person, place, and time.     Cranial Nerves: No cranial nerve deficit.     Sensory: No sensory deficit.     Motor: Weakness present.     Gait: Gait normal.     Comments: She has no obvious facial asymmetry.  Her speech is normal.  She has normal upper extremity strength.  She has some possible subtle weakness in her left lower extremity.  Normal sensation to light touch.     ED Results / Procedures / Treatments   Labs (all labs ordered are listed, but only abnormal results are displayed) Labs Reviewed  CBC - Abnormal; Notable for the following components:      Result Value   RBC 5.43 (*)    Hemoglobin 15.9 (*)    All other components within normal limits  DIFFERENTIAL - Abnormal; Notable for the following components:   Lymphs Abs 4.4 (*)    All other components within normal limits  COMPREHENSIVE METABOLIC PANEL - Abnormal; Notable for the following components:   Sodium 134 (*)    Glucose, Bld 230 (*)    All other components within normal limits  RAPID URINE DRUG SCREEN, HOSP PERFORMED - Abnormal; Notable for the following components:   Tetrahydrocannabinol POSITIVE (*)    All other components within normal limits  URINALYSIS, ROUTINE W REFLEX MICROSCOPIC - Abnormal; Notable for the following components:   Glucose, UA 50 (*)    Leukocytes,Ua TRACE (*)    Bacteria, UA RARE (*)    All other components within normal limits  CBG MONITORING, ED - Abnormal; Notable for the following components:   Glucose-Capillary 258 (*)    All other components within normal limits  I-STAT CHEM 8, ED - Abnormal; Notable for the following components:   Sodium 133 (*)    Chloride 97 (*)    Glucose, Bld 236 (*)    Hemoglobin 16.0 (*)    HCT 47.0  (*)    All other components within normal limits  RESP PANEL BY RT-PCR (FLU A&B, COVID) ARPGX2  PROTIME-INR  APTT  ETHANOL  I-STAT BETA HCG BLOOD, ED ([facility], [facility], [facility] ONLY)    EKG None  Radiology MR BRAIN WO CONTRAST  Result Date: 05/04/2022 CLINICAL DATA:  Neuro deficit, acute, stroke suspected. Left facial paresthesias. EXAM: MRI HEAD WITHOUT CONTRAST TECHNIQUE: Multiplanar, multiecho pulse sequences of the brain and surrounding structures were obtained without intravenous contrast. COMPARISON:  None Available. FINDINGS: Brain: There is a small acute right basal ganglia infarct involving the posterior aspect of the right  lentiform nucleus and caudate body, and there is also a small acute or early subacute white matter infarct in the right centrum semiovale. No significant chronic white matter disease is evident. No intracranial hemorrhage, mass, midline shift, or extra-axial fluid collection is identified. The ventricles and sulci are normal. Vascular: Major intracranial vascular flow voids are preserved. Skull and upper cervical spine: No suspicious marrow lesion. Sinuses/Orbits: Unremarkable orbits. Small mucous retention cyst in the right maxillary sinus. No significant mastoid fluid. Other: None. IMPRESSION: Small acute infarcts in the right basal ganglia and right centrum semiovale. Electronically Signed   By: Logan Bores M.D.   On: 05/04/2022 17:02    Procedures Procedures  {Document cardiac monitor, telemetry assessment procedure when appropriate:1}  Medications Ordered in ED Medications - No data to display  ED Course/ Medical Decision Making/ A&P Clinical Course as of 05/04/22 1831  Fri May 04, 2022  1709 I reassessed this patient due to concern that she is still in the waiting room and thinks that she had a stroke. She feels that the left facial numbness is returning and that she has been having difficulty with speech. No focal motor deficit. She is VAN negative and is still out of  the window for stroke. I have reassured patient that we are waiting for MRI results to return. [GL]    Clinical Course User Index [GL] Sherre Poot Adora Fridge, PA-C                           Medical Decision Making  ***  {Document critical care time when appropriate:1} {Document review of labs and clinical decision tools ie heart score, Chads2Vasc2 etc:1}  {Document your independent review of radiology images, and any outside records:1} {Document your discussion with family members, caretakers, and with consultants:1} {Document social determinants of health affecting pt's care:1} {Document your decision making why or why not admission, treatments were needed:1} Final Clinical Impression(s) / ED Diagnoses Final diagnoses:  None    Rx / DC Orders ED Discharge Orders     None

## 2022-05-04 NOTE — Assessment & Plan Note (Signed)
Patient has been untreated for her diabetes for several years now.  Check A1c.  Add sliding scale insulin.  She will need a new PCP at discharge.  She does not currently have a primary care doctor.

## 2022-05-05 ENCOUNTER — Other Ambulatory Visit (HOSPITAL_COMMUNITY): Payer: Self-pay

## 2022-05-05 ENCOUNTER — Observation Stay (HOSPITAL_BASED_OUTPATIENT_CLINIC_OR_DEPARTMENT_OTHER): Payer: Self-pay

## 2022-05-05 DIAGNOSIS — I6389 Other cerebral infarction: Secondary | ICD-10-CM

## 2022-05-05 DIAGNOSIS — I639 Cerebral infarction, unspecified: Secondary | ICD-10-CM | POA: Diagnosis present

## 2022-05-05 LAB — LIPID PANEL
Cholesterol: 247 mg/dL — ABNORMAL HIGH (ref 0–200)
HDL: 43 mg/dL (ref >40)
LDL Cholesterol: 159 mg/dL — ABNORMAL HIGH (ref 0–99)
Total CHOL/HDL Ratio: 5.7 RATIO
Triglycerides: 224 mg/dL — ABNORMAL HIGH (ref <150)
VLDL: 45 mg/dL — ABNORMAL HIGH (ref 0–40)

## 2022-05-05 LAB — HEMOGLOBIN A1C
Hgb A1c MFr Bld: 8.4 % — ABNORMAL HIGH (ref 4.8–5.6)
Mean Plasma Glucose: 194.38 mg/dL

## 2022-05-05 LAB — ECHOCARDIOGRAM COMPLETE BUBBLE STUDY
AR max vel: 2.25 cm2
AV Peak grad: 8.7 mmHg
Ao pk vel: 1.48 m/s
Area-P 1/2: 5.02 cm2
S' Lateral: 2.8 cm

## 2022-05-05 LAB — CBG MONITORING, ED: Glucose-Capillary: 278 mg/dL — ABNORMAL HIGH (ref 70–99)

## 2022-05-05 MED ORDER — ASPIRIN 81 MG PO TBEC
81.0000 mg | DELAYED_RELEASE_TABLET | Freq: Every day | ORAL | 3 refills | Status: AC
  Filled 2022-05-07 – 2022-11-08 (×6): qty 30, 30d supply, fill #0
  Filled 2022-11-25: qty 30, 30d supply, fill #1

## 2022-05-05 MED ORDER — ATORVASTATIN CALCIUM 40 MG PO TABS: 80.0000 mg | ORAL_TABLET | Freq: Every day | ORAL | Status: DC

## 2022-05-05 MED ORDER — GLIPIZIDE 5 MG PO TABS
5.0000 mg | ORAL_TABLET | Freq: Every day | ORAL | 1 refills | Status: DC
  Filled 2022-05-07: qty 30, 30d supply, fill #0
  Filled 2022-05-31: qty 30, 30d supply, fill #1

## 2022-05-05 MED ORDER — LISINOPRIL 20 MG PO TABS
20.0000 mg | ORAL_TABLET | Freq: Every day | ORAL | 2 refills | Status: DC
  Filled 2022-05-07: qty 30, 30d supply, fill #0
  Filled 2022-05-31: qty 30, 30d supply, fill #1

## 2022-05-05 MED ORDER — CLOPIDOGREL BISULFATE 75 MG PO TABS
75.0000 mg | ORAL_TABLET | Freq: Every day | ORAL | 0 refills | Status: AC
  Filled 2022-05-07: qty 21, 21d supply, fill #0

## 2022-05-05 MED ORDER — ATORVASTATIN CALCIUM 40 MG PO TABS
40.0000 mg | ORAL_TABLET | Freq: Every day | ORAL | Status: DC
  Administered 2022-05-05: 40 mg via ORAL
  Filled 2022-05-05: qty 1

## 2022-05-05 MED ORDER — ENOXAPARIN SODIUM 40 MG/0.4ML IJ SOSY: 40.0000 mg | PREFILLED_SYRINGE | INTRAMUSCULAR | Status: DC

## 2022-05-05 MED ORDER — EXCEDRIN MIGRAINE 250-250-65 MG PO TABS: 1.0000 | ORAL_TABLET | Freq: Four times a day (QID) | ORAL | Status: DC | PRN

## 2022-05-05 MED ORDER — ATORVASTATIN CALCIUM 80 MG PO TABS
80.0000 mg | ORAL_TABLET | Freq: Every day | ORAL | 3 refills | Status: DC
  Filled 2022-05-07: qty 30, 30d supply, fill #0
  Filled 2022-05-31: qty 30, 30d supply, fill #1

## 2022-05-05 MED ORDER — BLOOD GLUCOSE MONITOR KIT: PACK | 0 refills | Status: DC

## 2022-05-05 NOTE — Evaluation (Addendum)
Occupational Therapy Evaluation Patient Details Name: Regina Cantu MRN: [id number] DOB: Mar 08, 1981 Today's Date: 05/05/2022   History of Present Illness Regina Cantu is a 41 y.o. 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   Clinical Impression   Pt presented with mild L UE weakness, a headache, and a "funny" sensation to her L UE. Despite the aforementioned, her functional abilities were good and she was able to perform all assessed tasks independently.  She does not require further OT services, therefore OT will sign off and recommend she return home with her family at discharge.         Recommendations for follow up therapy are one component of a multi-disciplinary discharge planning process, led by the attending physician.  Recommendations may be updated based on patient status, additional functional criteria and insurance authorization.   Follow Up Recommendations  No OT follow up    Assistance Recommended at Discharge PRN  Patient can return home with the following Assist for transportation       Equipment Recommendations  None recommended by OT       Precautions / Restrictions Precautions Precautions: Fall Precaution Comments: reports falls" I am clumsy"      Mobility Bed Mobility Overal bed mobility: Independent           Transfers Overall transfer level: Independent      Balance     Sitting balance-Leahy Scale: Normal       Standing balance-Leahy Scale: Good           ADL either performed or assessed with clinical judgement   ADL   Eating/Feeding: Independent Eating/Feeding Details (indicate cue type and reason): based on clinical judgement Grooming: Independent            Upper Body Dressing : Independent   Lower Body Dressing: Independent   Toilet Transfer: Independent Toilet Transfer Details (indicate cue type and reason): based on clinical judgement                 Vision Baseline Vision/History: 1 Wears glasses Additional Comments: she correctly read the time depicted on the wall clock. visual tracking was WNL     Perception Perception Perception:  (left/right discrimination WNL)   Praxis      Pertinent Vitals/Pain Pain Assessment Pain Assessment: 0-10 Pain Score:  ("12") Pain Intervention(s): Limited activity within patient's tolerance     Hand Dominance Right   Extremity/Trunk Assessment Upper Extremity Assessment Upper Extremity Assessment:  (R UE and L UE AROM WFL. R UE strength 5/5, L UE strength 4+/5) LUE Sensation:  (she reported having a "funny" sensation to her L UE)   Lower Extremity Assessment Lower Extremity Assessment: Overall WFL for tasks assessed LLE Deficits / Details: hip flex 4=, knee ext 4+, knee flex 4+, Dfl 4+, reports decreased sensation around knee" like i have a wrap on it, also reports decreased LT foot   Cervical / Trunk Assessment Cervical / Trunk Assessment: Normal   Communication Communication Communication: No difficulties   Cognition Arousal/Alertness: Awake/alert Behavior During Therapy: WFL for tasks assessed/performed Overall Cognitive Status: Within Functional Limits for tasks assessed          General Comments: Oriented x4, able to follow commands without difficulty  Home Living Family/patient expects to be discharged to:: Private residence Living Arrangements: Spouse/significant other;Children Type of Home: House Home Access: Stairs to enter Technical brewer of Steps: 4-6 Entrance Stairs-Rails: Left;Right Home Layout: One level     Bathroom Shower/Tub: Tub/shower unit         Home Equipment: Crutches          Prior Functioning/Environment  Prior Level of Function : Independent/Modified Independent               ADLs Comments: independent with ADLs, cooking, cleaning, and working.        OT Problem List: Decreased strength;Impaired sensation    AM-PAC OT "6 Clicks" Daily Activity     Outcome Measure Help from another person eating meals?: None Help from another person taking care of personal grooming?: None Help from another person toileting, which includes using toliet, bedpan, or urinal?: None Help from another person bathing (including washing, rinsing, drying)?: None Help from another person to put on and taking off regular upper body clothing?: None Help from another person to put on and taking off regular lower body clothing?: None 6 Click Score: 24   End of Session Nurse Communication:  (Nurse cleared the pt for participation in therapy)  Activity Tolerance: Patient tolerated treatment well Patient left: in bed;with family/visitor present  OT Visit Diagnosis: Muscle weakness (generalized) (M62.81)                Time: 7048-8891 OT Time Calculation (min): 20 min Charges:  OT General Charges $OT Visit: 1 Visit OT Evaluation $OT Eval Low Complexity: 1 Low    Regina Cantu L Delrose Rohwer, OTR/L 05/05/2022, 5:30 PM

## 2022-05-05 NOTE — Consult Note (Addendum)
Neurology Consultation  Reason for Consult: Stroke on MRI  Referring Physician: Dr. Maryland Pink  CC: dysarthria and facial droop  History is obtained from: patient   HPI: Regina Cantu is a 41 y.o. female with past medical history of HTN, DM, and migraine and  ovarian cancer status post hysterectomy presents to the ER today with sudden onset of slurred speech, facial numbness and dysarthria starting around 10:30 AM. She states she was at work, and was unable to use the ipad or talk well. She said her mouth tongue and left face was numb and felt as if her face was drooping. She said symptoms resolved in about 30 minutes.  She smokes cigarettes~1 pack/week, drinks liquor daily at night and smoke marijuana.  She does not have a PCP and is not taking any medications.  MRI brain with small acute infarcts in the right basal ganglia and right centrum semiovale.    LKW: 05/04/2022  IV thrombolysis given?: no, outside window  Premorbid modified Rankin scale (mRS):  0-Completely asymptomatic and back to baseline post-stroke  ROS: Full ROS was performed and is negative except as noted in the HPI.   Past Medical History:  Diagnosis Date   Cancer ([place])    ovarian 2001 and 2010   Essential hypertension 05/04/2022   Hypertension    Migraine    Pityriasis rosea    Uncontrolled type 2 diabetes mellitus with hyperglycemia ([place]) 05/04/2022     Family History  Problem Relation Age of Onset   Hyperlipidemia Mother    Hypertension Mother    Diabetes Mother    Stroke Mother    Cerebral aneurysm Mother    Cerebral aneurysm Father    Cancer Father    Stroke Father    Breast cancer Maternal Aunt 65     Social History:   reports that she has been smoking cigarettes. She has been smoking an average of .25 packs per day. She has never used smokeless tobacco. She reports current alcohol use of about 14.0 standard drinks of alcohol per week. She reports that she does not use  drugs.  Medications  Current Facility-Administered Medications:     stroke: early stages of recovery book, , Does not apply, Once, Kristopher Oppenheim, DO   acetaminophen (TYLENOL) tablet 650 mg, 650 mg, Oral, Q4H PRN, 650 mg at 05/05/22 0831 **OR** acetaminophen (TYLENOL) 160 MG/5ML solution 650 mg, 650 mg, Per Tube, Q4H PRN **OR** acetaminophen (TYLENOL) suppository 650 mg, 650 mg, Rectal, Q4H PRN, Kristopher Oppenheim, DO   aspirin chewable tablet 81 mg, 81 mg, Oral, Daily, 81 mg at 05/04/22 1908 **OR** aspirin suppository 300 mg, 300 mg, Rectal, Daily, Kristopher Oppenheim, DO   clopidogrel (PLAVIX) tablet 75 mg, 75 mg, Oral, Daily, Kristopher Oppenheim, DO, 75 mg at 05/04/22 1908   enoxaparin (LOVENOX) injection 40 mg, 40 mg, Subcutaneous, Q24H, Bonnielee Haff, MD   melatonin tablet 10 mg, 10 mg, Oral, QHS PRN, Kristopher Oppenheim, DO   ondansetron [facility]) injection 4 mg, 4 mg, Intravenous, Q6H PRN, Kristopher Oppenheim, DO  Current Outpatient Medications:    EXCEDRIN MIGRAINE 250-250-65 MG tablet, Take 6-12 tablets by mouth daily as needed for migraine., Disp: , Rfl:    TYLENOL 500 MG tablet, Take 500-1,000 mg by mouth every 6 (six) hours as needed for mild pain., Disp: , Rfl:    Exam: Current vital signs: BP (!) 184/71 (BP Location: Right Arm)   Pulse 77   Temp 98.1 F (36.7 C) (Oral)   Resp 13  SpO2 99%  Vital signs in last 24 hours: Temp:  [97.6 F (36.4 C)-98.1 F (36.7 C)] 98.1 F (36.7 C) (09/30 0800) Pulse Rate:  [59-92] 77 (09/30 0800) Resp:  [13-21] 13 (09/30 0800) BP: (127-198)/(71-136) 184/71 (09/30 0800) SpO2:  [95 %-100 %] 99 % (09/30 0800)  GENERAL: Awake, alert in NAD HEENT: - Normocephalic and atraumatic, dry mm LUNGS - Clear to auscultation bilaterally with no wheezes CV - S1S2 RRR, no m/r/g, equal pulses bilaterally. ABDOMEN - Soft, nontender, nondistended with normoactive BS Ext: warm, well perfused, intact peripheral pulses, no edema  NEURO:  Mental Status: AA&Ox4 Language: speech is clear.  Naming,  repetition, fluency, and comprehension intact. Cranial Nerves: PERRL, EOMI, visual fields full, no facial asymmetry, facial sensation intact, hearing intact, tongue/uvula/soft palate midline, normal sternocleidomastoid and trapezius muscle strength. No evidence of tongue atrophy or fibrillations Motor: Bilateral uppers 5/5, left lower 4/5, right lower 5/5 Tone: is normal and bulk is normal Sensation- decreased sensation on the left hemibody Coordination: FTN intact bilaterally, no ataxia in BLE. Gait- deferred  NIHSS 1a Level of Conscious.: 0 1b LOC Questions: 0 1c LOC Commands: 0 2 Best Gaze: 0 3 Visual: 0 4 Facial Palsy: 0 5a Motor Arm - left: 0 5b Motor Arm - Right:  6a Motor Leg - Left: 1 6b Motor Leg - Right: 0 7 Limb Ataxia: 0 8 Sensory: 1 9 Best Language: 0 10 Dysarthria: 0 11 Extinct. and Inatten.: 0 TOTAL: 2   Labs I have reviewed labs in epic and the results pertinent to this consultation are:  CBC    Component Value Date/Time   WBC 10.5 05/04/2022 1442   RBC 5.43 (H) 05/04/2022 1442   HGB 16.0 (H) 05/04/2022 1523   HCT 47.0 (H) 05/04/2022 1523   PLT 340 05/04/2022 1442   MCV 84.7 05/04/2022 1442   MCH 29.3 05/04/2022 1442   MCHC 34.6 05/04/2022 1442   RDW 11.9 05/04/2022 1442   LYMPHSABS 4.4 (H) 05/04/2022 1442   MONOABS 0.7 05/04/2022 1442   EOSABS 0.1 05/04/2022 1442   BASOSABS 0.0 05/04/2022 1442    CMP     Component Value Date/Time   NA 133 (L) 05/04/2022 1523   K 4.0 05/04/2022 1523   CL 97 (L) 05/04/2022 1523   CO2 26 05/04/2022 1442   GLUCOSE 236 (H) 05/04/2022 1523   BUN 18 05/04/2022 1523   CREATININE 0.50 05/04/2022 1523   CALCIUM 9.6 05/04/2022 1442   PROT 7.9 05/04/2022 1442   ALBUMIN 4.5 05/04/2022 1442   AST 19 05/04/2022 1442   ALT 31 05/04/2022 1442   ALKPHOS 87 05/04/2022 1442   BILITOT 0.5 05/04/2022 1442   GFRNONAA >60 05/04/2022 1442   GFRAA >60 09/25/2018 0644    Lipid Panel  No results found for: "CHOL", "TRIG",  "HDL", "CHOLHDL", "VLDL", "LDLCALC", "LDLDIRECT"   Imaging I have reviewed the images obtained:  CT-head 1. Known small acute infarcts within the right basal ganglia and right centrum semiovale, better appreciated on the brain MRI performed earlier today. 2. Mild mucosal thickening versus small mucous retention cyst within the right maxillary sinus.   CTA head: 1. No intracranial large vessel occlusion is identified. 2. Intracranial atherosclerotic disease, as described. Most notably, a severe focal stenosis is present within a proximal right M2 MCA vessel. 3. 2 mm inferiorly projecting vascular protrusion arising from the supraclinoid left ICA, which may reflect an aneurysm or infundibulum.  CTA neck: 1. The common carotid and internal carotid arteries  are patent within the neck without stenosis. Mild atherosclerotic plaque about the right carotid bifurcation and within the proximal right ICA. 2. The non-dominant right vertebral artery is developmentally diminutive, but patent throughout the neck. 3. The dominant left vertebral artery is patent within the neck without stenosis.  MRI examination of the brain Small acute infarcts in the right basal ganglia and right centrum semiovale.  LABS: UDS THC  LDL pending  A1c pending  Assessment:   Yetunde Jadamarie Butson is a 41 y.o. female with past medical history of HTN, DM, and migraine and  ovarian cancer status post hysterectomy presents to the ER today with sudden onset of slurred speech, facial numbness and dysarthria starting around 10:30 AM. MRI brain with small acute infarcts in the right basal ganglia and right centrum semiovale.   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  Beulah Gandy DNP, ACNPC-AG   NEUROHOSPITALIST ADDENDUM Performed a face to face diagnostic evaluation.   I have reviewed the contents of history and physical exam as documented by PA/ARNP/Resident and agree with above documentation.  I have discussed and formulated the above plan as documented. Edits to the note have been made as needed.  Likely the etiology of stroke is coincidental small vessel strokes. She does have risk factors for small vessel disease including probable diabetes, hypertension, elevated LDL cholesterol, smoking. Will get hypercoagulable workup and 30 day cardiac monitor since somewhat uncommon to have 2 small vessel strokes incidentally. She needs to follow up with stroke team to follow up on the hypercoagulable workup. She reports that her insurance kicks in through her employer on October 2 and she will find a PCP right after.  Donnetta Simpers, MD [facility] [phone]   If 7pm to 7am, please call on call as listed on AMION.

## 2022-05-05 NOTE — Progress Notes (Signed)
PT Cancellation Note  Patient Details Name: Regina Cantu MRN: [id number] DOB: 13-Aug-1980   Cancelled Treatment:    Reason Eval/Treat Not Completed: Patient at procedure or test/unavailable Now with ED person. Will check back later. RN reports that patient has ambulated to [name] [phone] Weekend [phone]   Claretha Cooper 05/05/2022, 10:51 AM

## 2022-05-05 NOTE — Progress Notes (Addendum)
[facility] PROGRESS NOTE   Regina Cantu GGY:[id number] DOB: Dec 10, 1980 DOA: 05/04/2022  PCP: Patient, No Pcp Per  Brief History/Interval Summary: 41 year old African-American female prior history of ovarian cancer status post hysterectomy presents to the ER with sudden onset of slurred speech, facial numbness and dysarthria starting around 10:30 AM on the day of admission.  She was at work at the time.  She noticed that she was unable to talk.  She noticed that she had some slurred speech.  She noticed that she had facial numbness including her tongue.  Her symptoms initially resolved but then recurred while she was waiting in the ER waiting room. MRI performed demonstrated a right basal ganglia stroke and a stroke in the right centrum semiovale.  Patient was hospitalized for further management  Consultants: Neurology  Procedures: Transthoracic echocardiogram is pending    Subjective/Interval History: Patient mentions that most of her symptoms have almost resolved although she does notice some weakness in the left leg.  Speech seems to be back to baseline.    Assessment/Plan:  Acute stroke Neurology has been consulted.  Work-up is in progress. No significant carotid artery disease noted on CT angiogram of the neck. CT angiogram of the head does show disease intracranially with concern for a small aneurysm as well.  Will defer these to neurology. Lipid panel HbA1c echocardiogram is pending. Patient is currently on aspirin and Plavix. Allowing permissive hypertension. Does have subtle weakness of the left lower extremity.  SLP, PT and OT evaluation is pending.  Essential hypertension Home medication list does not show any antihypertensives.  Blood pressure noted to be elevated.  Allowing permissive hypertension for now due to acute stroke.  Will need definitive management of her elevated blood pressure in the next few days.  Diabetes mellitus type 2, uncontrolled with  hyperglycemia Patient has been untreated for her diabetes for several years.  A1c is pending.  SSI.  She will need a primary care provider.  She does not have health insurance.  History of ovarian cancer She is status post hysterectomy.  Marijuana use Urine drug screen positive for THC.  Patient will need counseling.  Obesity Estimated body mass index is 39.87 kg/m as calculated from the following:   Height as of 01/13/22: '5\' 2"'$  (1.575 m).   Weight as of 01/13/22: 98.9 kg.  DVT Prophylaxis: Subcutaneous heparin will be changed to Lovenox Code Status: Full code Family Communication: Discussed with patient Disposition Plan: Hopefully return home when improved and when work-up for stroke has been completed  Status is: Observation The patient may require care spanning > 2 midnights and may need to be moved to inpatient because: Acute stroke    Medications: Scheduled:   stroke: early stages of recovery book   Does not apply Once   aspirin  81 mg Oral Daily   Or   aspirin  300 mg Rectal Daily   clopidogrel  75 mg Oral Daily   heparin  5,000 Units Subcutaneous Q8H   Continuous: OJJ:[id number] **OR** acetaminophen (TYLENOL) oral liquid 160 mg/5 mL **OR** acetaminophen, melatonin, ondansetron (ZOFRAN) IV  Antibiotics: Anti-infectives (From admission, onward)    None       Objective:  Vital Signs  Vitals:   05/05/22 0559 05/05/22 0724 05/05/22 0726 05/05/22 0800  BP:  (!) 186/136  (!) 184/71  Pulse:  73 67 77  Resp:  (!) '21 15 13  '$ Temp: 97.8 F (36.6 C)   98.1 F (36.7 C)  TempSrc:  Oral  SpO2:  100% 100% 99%   No intake or output data in the 24 hours ending 05/05/22 0959 There were no vitals filed for this visit.  General appearance: Awake alert.  In no distress Resp: Clear to auscultation bilaterally.  Normal effort Cardio: S1-S2 is normal regular.  No S3-S4.  No rubs murmurs or bruit GI: Abdomen is soft.  Nontender nondistended.  Bowel sounds are present  normal.  No masses organomegaly Extremities: No edema.   Neurologic: Alert and oriented x3.  4 out of 5 strength noted in the left lower extremity   Lab Results:  Data Reviewed: I have personally reviewed following labs and reports of the imaging studies  CBC: Recent Labs  Lab 05/04/22 1442 05/04/22 1523  WBC 10.5  --   NEUTROABS 5.2  --   HGB 15.9* 16.0*  HCT 46.0 47.0*  MCV 84.7  --   PLT 340  --     Basic Metabolic Panel: Recent Labs  Lab 05/04/22 1442 05/04/22 1523  NA 134* 133*  K 3.5 4.0  CL 99 97*  CO2 26  --   GLUCOSE 230* 236*  BUN 16 18  CREATININE 0.67 0.50  CALCIUM 9.6  --     GFR: CrCl cannot be calculated (Unknown ideal weight.).  Liver Function Tests: Recent Labs  Lab 05/04/22 1442  AST 19  ALT 31  ALKPHOS 87  BILITOT 0.5  PROT 7.9  ALBUMIN 4.5     Coagulation Profile: Recent Labs  Lab 05/04/22 1442  INR 0.9     CBG: Recent Labs  Lab 05/04/22 1440 05/05/22 0614  GLUCAP 258* 278*     Recent Results (from the past 240 hour(s))  Resp Panel by RT-PCR (Flu A&B, Covid) Anterior Nasal Swab     Status: None   Collection Time: 05/04/22  6:39 PM   Specimen: Anterior Nasal Swab  Result Value Ref Range Status   SARS Coronavirus 2 by RT PCR NEGATIVE NEGATIVE Final    Comment: (NOTE) SARS-CoV-2 target nucleic acids are NOT DETECTED.  The SARS-CoV-2 RNA is generally detectable in upper respiratory specimens during the acute phase of infection. The lowest concentration of SARS-CoV-2 viral copies this assay can detect is 138 copies/mL. A negative result does not preclude SARS-Cov-2 infection and should not be used as the sole basis for treatment or other patient management decisions. A negative result may occur with  improper specimen collection/handling, submission of specimen other than nasopharyngeal swab, presence of viral mutation(s) within the areas targeted by this assay, and inadequate number of viral copies(<138  copies/mL). A negative result must be combined with clinical observations, patient history, and epidemiological information. The expected result is Negative.  Fact Sheet for Patients:  EntrepreneurPulse.com.au  Fact Sheet for Healthcare Providers:  IncredibleEmployment.be  This test is no t yet approved or cleared by the [place] FDA and  has been authorized for detection and/or diagnosis of SARS-CoV-2 by FDA under an Emergency Use Authorization (EUA). This EUA will remain  in effect (meaning this test can be used) for the duration of the COVID-19 declaration under Section 564(b)(1) of the Act, 21 U.S.C.section 360bbb-3(b)(1), unless the authorization is terminated  or revoked sooner.       Influenza A by PCR NEGATIVE NEGATIVE Final   Influenza B by PCR NEGATIVE NEGATIVE Final    Comment: (NOTE) The Xpert Xpress SARS-CoV-2/FLU/RSV plus assay is intended as an aid in the diagnosis of influenza from Nasopharyngeal swab specimens and should not  be used as a sole basis for treatment. Nasal washings and aspirates are unacceptable for Xpert Xpress SARS-CoV-2/FLU/RSV testing.  Fact Sheet for Patients: EntrepreneurPulse.com.au  Fact Sheet for Healthcare Providers: IncredibleEmployment.be  This test is not yet approved or cleared by the [place] FDA and has been authorized for detection and/or diagnosis of SARS-CoV-2 by FDA under an Emergency Use Authorization (EUA). This EUA will remain in effect (meaning this test can be used) for the duration of the COVID-19 declaration under Section 564(b)(1) of the Act, 21 U.S.C. section 360bbb-3(b)(1), unless the authorization is terminated or revoked.  Performed at [facility], [place] [address] [place], [place] [address]       Radiology Studies: CT ANGIO HEAD NECK W WO CM  Result Date: 05/04/2022 CLINICAL DATA:  Provided history:  Transient ischemic attack, determine embolic source. Facial numbness on left since 10 a.m. EXAM: CT ANGIOGRAPHY HEAD AND NECK TECHNIQUE: Multidetector CT imaging of the head and neck was performed using the standard protocol during bolus administration of intravenous contrast. Multiplanar CT image reconstructions and MIPs were obtained to evaluate the vascular anatomy. Carotid stenosis measurements (when applicable) are obtained utilizing NASCET criteria, using the distal internal carotid diameter as the denominator. RADIATION DOSE REDUCTION: This exam was performed according to the departmental dose-optimization program which includes automated exposure control, adjustment of the mA and/or kV according to patient size and/or use of iterative reconstruction technique. CONTRAST:  67m OMNIPAQUE IOHEXOL 350 MG/ML SOLN COMPARISON:  Brain MRI 05/04/2022. FINDINGS: CT HEAD FINDINGS Brain: Cerebral volume is normal. Small acute infarcts within the right basal ganglia and right centrum semiovale were better appreciated on the brain MRI performed earlier today. There is no acute intracranial hemorrhage. No extra-axial fluid collection. No evidence of an intracranial mass. No midline shift. Vascular: No hyperdense vessel. Atherosclerotic calcifications. Skull: No fracture or aggressive osseous lesion. Sinuses/Orbits: No mass or acute finding within the imaged orbits. Mild mucosal thickening versus small mucous retention cyst within the right maxillary sinus. Review of the MIP images confirms the above findings CTA NECK FINDINGS Aortic arch: Standard aortic branching. No hemodynamically significant innominate or proximal subclavian artery stenosis. Right carotid system: CCA and ICA patent within the neck without stenosis. Minimal atherosclerotic plaque within the proximal ICA. Partially retropharyngeal course of the cervical ICA. Left carotid system: CCA and ICA patent within the neck without stenosis or significant  atherosclerotic disease. Partially retropharyngeal course of the cervical ICA. Vertebral arteries: The right vertebral artery is developmentally diminutive, but patent within the neck. The dominant left vertebral artery is patent within the neck without stenosis Skeleton: Mild nonspecific reversal of the expected cervical lordosis. No acute fracture or aggressive osseous lesion. Other neck: Subcentimeter nodule within the left thyroid lobe, not meeting consensus criteria for ultrasound follow-up based on size. No follow-up imaging is recommended. Reference: J Am Coll Radiol. 2015 Feb;12(2): 143-50. Upper chest: No consolidation within the imaged lung apices. Review of the MIP images confirms the above findings CTA HEAD FINDINGS Anterior circulation: The intracranial internal carotid arteries are patent. Nonstenotic atherosclerotic plaque within both vessels. The M1 middle cerebral arteries are patent. Atherosclerotic irregularity of the M2 and more distal MCA vessels, bilaterally. Most notably, there is a high-grade stenosis within a proximal M2 right MCA vessel (series 15, image 14) (series 16, image 49). The anterior cerebral arteries are patent. 1-2 mm inferiorly projecting vascular protrusion arising from the supraclinoid left ICA, which may reflect an aneurysm or infundibulum (for instance as seen on  series 11, image 230) (series 14, image 121). Posterior circulation: The non dominant intracranial right vertebral artery terminates predominantly, or entirely, as the right PICA. The dominant intracranial left vertebral artery is patent without stenosis. The basilar artery is patent. The posterior cerebral arteries are patent. The posterior cerebral arteries are patent. Atherosclerotic irregularity of both vessels without high-grade proximal stenosis. The right PCA is fetal in origin. The left posterior communicating artery is diminutive or absent. Venous sinuses: Within the limitations of contrast timing, no  convincing thrombus. Anatomic variants: As described. Review of the MIP images confirms the above findings IMPRESSION: CT head: 1. Known small acute infarcts within the right basal ganglia and right centrum semiovale, better appreciated on the brain MRI performed earlier today. 2. Mild mucosal thickening versus small mucous retention cyst within the right maxillary sinus. CTA neck: 1. The common carotid and internal carotid arteries are patent within the neck without stenosis. Mild atherosclerotic plaque about the right carotid bifurcation and within the proximal right ICA. 2. The non-dominant right vertebral artery is developmentally diminutive, but patent throughout the neck. 3. The dominant left vertebral artery is patent within the neck without stenosis. CTA head: 1. No intracranial large vessel occlusion is identified. 2. Intracranial atherosclerotic disease, as described. Most notably, a severe focal stenosis is present within a proximal right M2 MCA vessel. 3. 2 mm inferiorly projecting vascular protrusion arising from the supraclinoid left ICA, which may reflect an aneurysm or infundibulum. Electronically Signed   By: Kellie Simmering D.O.   On: 05/04/2022 20:57   MR BRAIN WO CONTRAST  Result Date: 05/04/2022 CLINICAL DATA:  Neuro deficit, acute, stroke suspected. Left facial paresthesias. EXAM: MRI HEAD WITHOUT CONTRAST TECHNIQUE: Multiplanar, multiecho pulse sequences of the brain and surrounding structures were obtained without intravenous contrast. COMPARISON:  None Available. FINDINGS: Brain: There is a small acute right basal ganglia infarct involving the posterior aspect of the right lentiform nucleus and caudate body, and there is also a small acute or early subacute white matter infarct in the right centrum semiovale. No significant chronic white matter disease is evident. No intracranial hemorrhage, mass, midline shift, or extra-axial fluid collection is identified. The ventricles and sulci are  normal. Vascular: Major intracranial vascular flow voids are preserved. Skull and upper cervical spine: No suspicious marrow lesion. Sinuses/Orbits: Unremarkable orbits. Small mucous retention cyst in the right maxillary sinus. No significant mastoid fluid. Other: None. IMPRESSION: Small acute infarcts in the right basal ganglia and right centrum semiovale. Electronically Signed   By: Logan Bores M.D.   On: 05/04/2022 17:02       LOS: 0 days   [facility] Pager on [email or web address]  05/05/2022, 9:59 AM

## 2022-05-05 NOTE — Evaluation (Signed)
Physical Therapy Evaluation Patient Details Name: Regina Cantu MRN: [id number] DOB: 17-Feb-1981 Today's Date: 05/05/2022  History of Present Illness  Regina Cantu is a 41 y.o. 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  Clinical Impression   Patient admitted for above problems. Patient  demonstrates mild weakness left LE but ambulates WFL, up ad lib in room.  Patient does report history of falls and states " I am clumsy." Recommended patient have  min guard assistance when negotiating  steps  , spouse present and  affirms.  No further PT needs at this time. Demonstrated/instructed patient on LLE exercises .  BP after ambulation  141/98.   PT will sign off.   Recommendations for follow up therapy are one component of a multi-disciplinary discharge planning process, led by the attending physician.  Recommendations may be updated based on patient status, additional functional criteria and insurance authorization.  Follow Up Recommendations No PT follow up      Assistance Recommended at Discharge PRN  Patient can return home with the following  Help with stairs or ramp for entrance;A little help with bathing/dressing/bathroom    Equipment Recommendations None recommended by PT  Recommendations for Other Services       Functional Status Assessment Patient has not had a recent decline in their functional status     Precautions / Restrictions Precautions Precautions: Fall Precaution Comments: reports falls" I am clumsy"      Mobility  Bed Mobility Overal bed mobility: Independent                  Transfers Overall transfer level: Independent                      Ambulation/Gait Ambulation/Gait  assistance: Supervision Gait Distance (Feet): 300 Feet Assistive device: None Gait Pattern/deviations: WFL(Within Functional Limits)   Gait velocity interpretation: 1.31 - 2.62 ft/sec, indicative of limited community ambulator   General Gait Details: no deficits noted  Stairs            Wheelchair Mobility    Modified Rankin (Stroke Patients Only)       Balance Overall balance assessment: Mild deficits observed, not formally tested                                           Pertinent Vitals/Pain Pain Assessment Pain Assessment: No/denies pain    Home Living Family/patient expects to be discharged to:: Private residence Living Arrangements: Spouse/significant other;Children Available Help at Discharge: Family;Available 24 hours/day Type of Home: House Home Access: Stairs to enter Entrance Stairs-Rails: Chemical engineer of Steps: 4-6   Home Layout: One level Home Equipment: None      Prior Function Prior Level of Function : Independent/Modified Independent                     Hand Dominance   Dominant Hand: Right    Extremity/Trunk Assessment   Upper Extremity Assessment Upper Extremity Assessment: Defer to OT evaluation    Lower Extremity Assessment Lower Extremity Assessment: LLE deficits/detail LLE Deficits / Details: hip flex 4=, knee ext 4+, knee  flex 4+, Dfl 4+, reports decreased sensation around knee" like i have a wrap on it, also reports decreased LT foot    Cervical / Trunk Assessment Cervical / Trunk Assessment: Normal  Communication   Communication: No difficulties  Cognition Arousal/Alertness: Awake/alert Behavior During Therapy: WFL for tasks assessed/performed Overall Cognitive Status: Within Functional Limits for tasks assessed                                          General Comments      Exercises     Assessment/Plan    PT Assessment Patient does not need any  further PT services  PT Problem List         PT Treatment Interventions      PT Goals (Current goals can be found in the Care Plan section)  Acute Rehab PT Goals Patient Stated Goal: go home PT Goal Formulation: All assessment and education complete, DC therapy    Frequency       Co-evaluation               AM-PAC PT "6 Clicks" Mobility  Outcome Measure Help needed turning from your back to your side while in a flat bed without using bedrails?: None Help needed moving from lying on your back to sitting on the side of a flat bed without using bedrails?: None Help needed moving to and from a bed to a chair (including a wheelchair)?: None Help needed standing up from a chair using your arms (e.g., wheelchair or bedside chair)?: None Help needed to walk in hospital room?: None Help needed climbing 3-5 steps with a railing? : A Little 6 Click Score: 23    End of Session   Activity Tolerance: Patient tolerated treatment well Patient left: in bed;with family/visitor present Nurse Communication: Mobility status PT Visit Diagnosis: Unsteadiness on feet (R26.81)    Time: 1545-1600 PT Time Calculation (min) (ACUTE ONLY): 15 min   Charges:   PT Evaluation $PT Eval Low Complexity: 1 Low          [place] Office [phone] Weekend pager-[phone]   Claretha Cooper 05/05/2022, 4:04 PM

## 2022-05-05 NOTE — Discharge Summary (Signed)
[facility]  Physician Discharge Summary   Patient ID: Regina Cantu MRN: [id number] DOB/AGE: [birth year] 41 y.o.  Admit date: 05/04/2022 Discharge date: 05/05/2022    PCP: Patient, No Pcp Per  DISCHARGE DIAGNOSES:  Acute CVA (cerebrovascular accident) ([place])   Uncontrolled type 2 diabetes mellitus with hyperglycemia ([place])   History of ovarian cancer   Essential hypertension      RECOMMENDATIONS FOR OUTPATIENT FOLLOW UP: Ambulatory referral sent to neuro interventional radiology for her aneurysm Ambulatory referral sent to neurology for stroke Patient to seek out primary care provider when her insurance kicks in in a few days Referral sent to cardiology to arrange 30-day monitor    Home Health: None Equipment/Devices: None  CODE STATUS: Full code  DISCHARGE CONDITION: fair  Diet recommendation: Modified carbohydrate  INITIAL HISTORY: 41 year old African-American female prior history of ovarian cancer status post hysterectomy presents to the ER with sudden onset of slurred speech, facial numbness and dysarthria starting around 10:30 AM on the day of admission.  She was at work at the time.  She noticed that she was unable to talk.  She noticed that she had some slurred speech.  She noticed that she had facial numbness including her tongue.  Her symptoms initially resolved but then recurred while she was waiting in the ER waiting room. MRI performed demonstrated a right basal ganglia stroke and a stroke in the right centrum semiovale.  Patient was hospitalized for further management   Consultants: Neurology   Procedures: Transthoracic echocardiogram     HOSPITAL COURSE:   Acute stroke Neurology was consulted.   No significant carotid artery disease noted on CT angiogram of the neck. CT angiogram of the head does show disease intracranially with concern for a small aneurysm as well.   LDL noted to be 159.  Statin has been initiated.  HbA1c 8.4.  See  below Neurology recommends aspirin and Plavix for 3 weeks followed by aspirin alone.   Patient seen by physical therapy and has been cleared. Cleared by neurology as well.  Brain aneurysm Urology recommends follow-up with neuro interventional radiology.  Referral has been sent.   Essential hypertension Initially allowed permissive hypertension.  Echocardiogram shows LVH.  We will discharge her on lisinopril.     Diabetes mellitus type 2, uncontrolled with hyperglycemia HbA1c 8.4.  Does not want to take metformin.  Will prescribe glipizide for now until she can establish and follow-up with primary care provider.   History of ovarian cancer She is status post hysterectomy.   Marijuana use Urine drug screen positive for THC.     Obesity Estimated body mass index is 39.87 kg/m as calculated from the following:   Height as of 01/13/22: _0  (1.575 m).   Weight as of 01/13/22: 98.9 kg.  Patient is stable.  Okay for discharge home today.   PERTINENT LABS:  The results of significant diagnostics from this hospitalization (including imaging, microbiology, ancillary and laboratory) are listed below for reference.    Microbiology: Recent Results (from the past 240 hour(s))  Resp Panel by RT-PCR (Flu A&B, Covid) Anterior Nasal Swab     Status: None   Collection Time: 05/04/22  6:39 PM   Specimen: Anterior Nasal Swab  Result Value Ref Range Status   SARS Coronavirus 2 by RT PCR NEGATIVE NEGATIVE Final    Comment: (NOTE) SARS-CoV-2 target nucleic acids are NOT DETECTED.  The SARS-CoV-2 RNA is generally detectable in upper respiratory specimens during the acute phase of infection. The lowest  concentration of SARS-CoV-2 viral copies this assay can detect is 138 copies/mL. A negative result does not preclude SARS-Cov-2 infection and should not be used as the sole basis for treatment or other patient management decisions. A negative result may occur with  improper specimen  collection/handling, submission of specimen other than nasopharyngeal swab, presence of viral mutation(s) within the areas targeted by this assay, and inadequate number of viral copies(<138 copies/mL). A negative result must be combined with clinical observations, patient history, and epidemiological information. The expected result is Negative.  Fact Sheet for Patients:  EntrepreneurPulse.com.au  Fact Sheet for Healthcare Providers:  IncredibleEmployment.be  This test is no t yet approved or cleared by the [place] FDA and  has been authorized for detection and/or diagnosis of SARS-CoV-2 by FDA under an Emergency Use Authorization (EUA). This EUA will remain  in effect (meaning this test can be used) for the duration of the COVID-19 declaration under Section 564(b)(1) of the Act, 21 U.S.C.section 360bbb-3(b)(1), unless the authorization is terminated  or revoked sooner.       Influenza A by PCR NEGATIVE NEGATIVE Final   Influenza B by PCR NEGATIVE NEGATIVE Final    Comment: (NOTE) The Xpert Xpress SARS-CoV-2/FLU/RSV plus assay is intended as an aid in the diagnosis of influenza from Nasopharyngeal swab specimens and should not be used as a sole basis for treatment. Nasal washings and aspirates are unacceptable for Xpert Xpress SARS-CoV-2/FLU/RSV testing.  Fact Sheet for Patients: EntrepreneurPulse.com.au  Fact Sheet for Healthcare Providers: IncredibleEmployment.be  This test is not yet approved or cleared by the [place] FDA and has been authorized for detection and/or diagnosis of SARS-CoV-2 by FDA under an Emergency Use Authorization (EUA). This EUA will remain in effect (meaning this test can be used) for the duration of the COVID-19 declaration under Section 564(b)(1) of the Act, 21 U.S.C. section 360bbb-3(b)(1), unless the authorization is terminated or revoked.  Performed at [facility], [place] [address] [place], [place] [address]      Labs:   Basic Metabolic Panel: Recent Labs  Lab 05/04/22 1442 05/04/22 1523  NA 134* 133*  K 3.5 4.0  CL 99 97*  CO2 26  --   GLUCOSE 230* 236*  BUN 16 18  CREATININE 0.67 0.50  CALCIUM 9.6  --    Liver Function Tests: Recent Labs  Lab 05/04/22 1442  AST 19  ALT 31  ALKPHOS 87  BILITOT 0.5  PROT 7.9  ALBUMIN 4.5    CBC: Recent Labs  Lab 05/04/22 1442 05/04/22 1523  WBC 10.5  --   NEUTROABS 5.2  --   HGB 15.9* 16.0*  HCT 46.0 47.0*  MCV 84.7  --   PLT 340  --      CBG: Recent Labs  Lab 05/04/22 1440 05/05/22 0614  GLUCAP 258* 278*     IMAGING STUDIES ECHOCARDIOGRAM COMPLETE BUBBLE STUDY  Result Date: 05/05/2022    ECHOCARDIOGRAM REPORT   Patient Name:   Jameson Abbott Pao Date of Exam: 05/05/2022 Medical Rec #:  [id number]          Height:       62.0 in Accession #:    [id number]         Weight:       218.0 lb Date of Birth:  1980-12-28          BSA:          1.983 m Patient Age:    45 years  BP:           184/71 mmHg Patient Gender: F                  HR:           71 bpm. Exam Location:  Inpatient Procedure: 2D Echo, Cardiac Doppler, Color Doppler and Saline Contrast Bubble            Study Indications:    Stroke  History:        Patient has no prior history of Echocardiogram examinations.                 Risk Factors:Hypertension and Diabetes.  Sonographer:    Jefferey Pica Referring Phys: [place]  1. Left ventricular ejection fraction, by estimation, is 70 to 75%. Left ventricular ejection fraction by PLAX is 71 %. The left ventricle has hyperdynamic function. The left ventricle has no regional wall motion abnormalities. There is mild left ventricular hypertrophy. Left ventricular diastolic parameters are consistent with Grade I diastolic dysfunction (impaired relaxation).  2. Right ventricular systolic function is normal. The right ventricular size is  normal.  3. The mitral valve is grossly normal. Trivial mitral valve regurgitation.  4. The aortic valve is tricuspid. Aortic valve regurgitation is not visualized.  5. Agitated saline contrast bubble study was negative, with no evidence of any interatrial shunt. Comparison(s): No prior Echocardiogram. FINDINGS  Left Ventricle: Left ventricular ejection fraction, by estimation, is 70 to 75%. Left ventricular ejection fraction by PLAX is 71 %. The left ventricle has hyperdynamic function. The left ventricle has no regional wall motion abnormalities. The left ventricular internal cavity size was normal in size. There is mild left ventricular hypertrophy. Left ventricular diastolic parameters are consistent with Grade I diastolic dysfunction (impaired relaxation). Indeterminate filling pressures. Right Ventricle: The right ventricular size is normal. No increase in right ventricular wall thickness. Right ventricular systolic function is normal. Left Atrium: Left atrial size was normal in size. Right Atrium: Right atrial size was normal in size. Pericardium: There is no evidence of pericardial effusion. Mitral Valve: The mitral valve is grossly normal. Trivial mitral valve regurgitation. Tricuspid Valve: The tricuspid valve is grossly normal. Tricuspid valve regurgitation is trivial. Aortic Valve: The aortic valve is tricuspid. Aortic valve regurgitation is not visualized. Aortic valve peak gradient measures 8.7 mmHg. Pulmonic Valve: The pulmonic valve was normal in structure. Pulmonic valve regurgitation is not visualized. Aorta: The aortic root and ascending aorta are structurally normal, with no evidence of dilitation. IAS/Shunts: No atrial level shunt detected by color flow Doppler. Agitated saline contrast was given intravenously to evaluate for intracardiac shunting. Agitated saline contrast bubble study was negative, with no evidence of any interatrial shunt.  LEFT VENTRICLE PLAX 2D LV EF:         Left             Diastology                ventricular     LV e' medial:    7.58 cm/s                ejection        LV E/e' medial:  8.6                fraction by     LV e' lateral:   6.98 cm/s                PLAX is  71      LV E/e' lateral: 9.4                %. LVIDd:         4.70 cm LVIDs:         2.80 cm LV PW:         1.20 cm LV IVS:        1.10 cm LVOT diam:     1.90 cm LV SV:         68 LV SV Index:   34 LVOT Area:     2.84 cm  RIGHT VENTRICLE RV Basal diam:  2.60 cm RV S prime:     12.30 cm/s TAPSE (M-mode): 2.8 cm LEFT ATRIUM           Index        RIGHT ATRIUM           Index LA diam:      3.70 cm 1.87 cm/m   RA Area:     15.00 cm LA Vol (A2C): 33.4 ml 16.84 ml/m  RA Volume:   37.60 ml  18.96 ml/m LA Vol (A4C): 47.7 ml 24.05 ml/m  AORTIC VALVE                 PULMONIC VALVE AV Area (Vmax): 2.25 cm     PV Vmax:       1.16 m/s AV Vmax:        147.50 cm/s  PV Peak grad:  5.4 mmHg AV Peak Grad:   8.7 mmHg LVOT Vmax:      117.00 cm/s LVOT Vmean:     76.200 cm/s LVOT VTI:       0.239 m  AORTA Ao Root diam: 2.70 cm Ao Asc diam:  3.30 cm MITRAL VALVE MV Area (PHT): 5.02 cm    SHUNTS MV Decel Time: 151 msec    Systemic VTI:  0.24 m MV E velocity: 65.50 cm/s  Systemic Diam: 1.90 cm MV A velocity: 93.00 cm/s MV E/A ratio:  0.70 Lyman Bishop MD Electronically signed by Lyman Bishop MD Signature Date/Time: 05/05/2022/12:37:35 PM    Final    CT ANGIO HEAD NECK W WO CM  Result Date: 05/04/2022 CLINICAL DATA:  Provided history: Transient ischemic attack, determine embolic source. Facial numbness on left since 10 a.m. EXAM: CT ANGIOGRAPHY HEAD AND NECK TECHNIQUE: Multidetector CT imaging of the head and neck was performed using the standard protocol during bolus administration of intravenous contrast. Multiplanar CT image reconstructions and MIPs were obtained to evaluate the vascular anatomy. Carotid stenosis measurements (when applicable) are obtained utilizing NASCET criteria, using the distal internal carotid diameter as  the denominator. RADIATION DOSE REDUCTION: This exam was performed according to the departmental dose-optimization program which includes automated exposure control, adjustment of the mA and/or kV according to patient size and/or use of iterative reconstruction technique. CONTRAST:  65m OMNIPAQUE IOHEXOL 350 MG/ML SOLN COMPARISON:  Brain MRI 05/04/2022. FINDINGS: CT HEAD FINDINGS Brain: Cerebral volume is normal. Small acute infarcts within the right basal ganglia and right centrum semiovale were better appreciated on the brain MRI performed earlier today. There is no acute intracranial hemorrhage. No extra-axial fluid collection. No evidence of an intracranial mass. No midline shift. Vascular: No hyperdense vessel. Atherosclerotic calcifications. Skull: No fracture or aggressive osseous lesion. Sinuses/Orbits: No mass or acute finding within the imaged orbits. Mild mucosal thickening versus small mucous retention cyst within the right maxillary sinus. Review of the MIP images  confirms the above findings CTA NECK FINDINGS Aortic arch: Standard aortic branching. No hemodynamically significant innominate or proximal subclavian artery stenosis. Right carotid system: CCA and ICA patent within the neck without stenosis. Minimal atherosclerotic plaque within the proximal ICA. Partially retropharyngeal course of the cervical ICA. Left carotid system: CCA and ICA patent within the neck without stenosis or significant atherosclerotic disease. Partially retropharyngeal course of the cervical ICA. Vertebral arteries: The right vertebral artery is developmentally diminutive, but patent within the neck. The dominant left vertebral artery is patent within the neck without stenosis Skeleton: Mild nonspecific reversal of the expected cervical lordosis. No acute fracture or aggressive osseous lesion. Other neck: Subcentimeter nodule within the left thyroid lobe, not meeting consensus criteria for ultrasound follow-up based on size.  No follow-up imaging is recommended. Reference: J Am Coll Radiol. 2015 Feb;12(2): 143-50. Upper chest: No consolidation within the imaged lung apices. Review of the MIP images confirms the above findings CTA HEAD FINDINGS Anterior circulation: The intracranial internal carotid arteries are patent. Nonstenotic atherosclerotic plaque within both vessels. The M1 middle cerebral arteries are patent. Atherosclerotic irregularity of the M2 and more distal MCA vessels, bilaterally. Most notably, there is a high-grade stenosis within a proximal M2 right MCA vessel (series 15, image 14) (series 16, image 49). The anterior cerebral arteries are patent. 1-2 mm inferiorly projecting vascular protrusion arising from the supraclinoid left ICA, which may reflect an aneurysm or infundibulum (for instance as seen on series 11, image 230) (series 14, image 121). Posterior circulation: The non dominant intracranial right vertebral artery terminates predominantly, or entirely, as the right PICA. The dominant intracranial left vertebral artery is patent without stenosis. The basilar artery is patent. The posterior cerebral arteries are patent. The posterior cerebral arteries are patent. Atherosclerotic irregularity of both vessels without high-grade proximal stenosis. The right PCA is fetal in origin. The left posterior communicating artery is diminutive or absent. Venous sinuses: Within the limitations of contrast timing, no convincing thrombus. Anatomic variants: As described. Review of the MIP images confirms the above findings IMPRESSION: CT head: 1. Known small acute infarcts within the right basal ganglia and right centrum semiovale, better appreciated on the brain MRI performed earlier today. 2. Mild mucosal thickening versus small mucous retention cyst within the right maxillary sinus. CTA neck: 1. The common carotid and internal carotid arteries are patent within the neck without stenosis. Mild atherosclerotic plaque about the  right carotid bifurcation and within the proximal right ICA. 2. The non-dominant right vertebral artery is developmentally diminutive, but patent throughout the neck. 3. The dominant left vertebral artery is patent within the neck without stenosis. CTA head: 1. No intracranial large vessel occlusion is identified. 2. Intracranial atherosclerotic disease, as described. Most notably, a severe focal stenosis is present within a proximal right M2 MCA vessel. 3. 2 mm inferiorly projecting vascular protrusion arising from the supraclinoid left ICA, which may reflect an aneurysm or infundibulum. Electronically Signed   By: Kellie Simmering D.O.   On: 05/04/2022 20:57   MR BRAIN WO CONTRAST  Result Date: 05/04/2022 CLINICAL DATA:  Neuro deficit, acute, stroke suspected. Left facial paresthesias. EXAM: MRI HEAD WITHOUT CONTRAST TECHNIQUE: Multiplanar, multiecho pulse sequences of the brain and surrounding structures were obtained without intravenous contrast. COMPARISON:  None Available. FINDINGS: Brain: There is a small acute right basal ganglia infarct involving the posterior aspect of the right lentiform nucleus and caudate body, and there is also a small acute or early subacute white matter infarct in the  right centrum semiovale. No significant chronic white matter disease is evident. No intracranial hemorrhage, mass, midline shift, or extra-axial fluid collection is identified. The ventricles and sulci are normal. Vascular: Major intracranial vascular flow voids are preserved. Skull and upper cervical spine: No suspicious marrow lesion. Sinuses/Orbits: Unremarkable orbits. Small mucous retention cyst in the right maxillary sinus. No significant mastoid fluid. Other: None. IMPRESSION: Small acute infarcts in the right basal ganglia and right centrum semiovale. Electronically Signed   By: Logan Bores M.D.   On: 05/04/2022 17:02    DISCHARGE EXAMINATION: See progress note from earlier today   DISPOSITION: Home with  family  Discharge Instructions     Ambulatory referral to Interventional Radiology   Complete by: As directed    Ambulatory referral to Neurology   Complete by: As directed    An appointment is requested in approximately: 8 weeks for acute CVA and aneurysm   Call MD for:  difficulty breathing, headache or visual disturbances   Complete by: As directed    Call MD for:  extreme fatigue   Complete by: As directed    Call MD for:  persistant dizziness or light-headedness   Complete by: As directed    Call MD for:  persistant nausea and vomiting   Complete by: As directed    Call MD for:  severe uncontrolled pain   Complete by: As directed    Call MD for:  temperature >100.4   Complete by: As directed    Diet Carb Modified   Complete by: As directed    Discharge instructions   Complete by: As directed    Please take your medications as prescribed.  Referral has been sent to neurology office.  Message has been sent to cardiology to arrange a 30-day monitor.  Please check your blood glucose levels and maintain a log for your primary care provider.  Please establish with a primary care provider when your insurance kicks in on Monday.  You were cared for by a hospitalist during your hospital stay. If you have any questions about your discharge medications or the care you received while you were in the hospital after you are discharged, you can call the unit and asked to speak with the hospitalist on call if the hospitalist that took care of you is not available. Once you are discharged, your primary care physician will handle any further medical issues. Please note that NO REFILLS for any discharge medications will be authorized once you are discharged, as it is imperative that you return to your primary care physician (or establish a relationship with a primary care physician if you do not have one) for your aftercare needs so that they can reassess your need for medications and monitor your lab  values. If you do not have a primary care physician, you can call [phone] for a physician referral.   Increase activity slowly   Complete by: As directed           Allergies as of 05/05/2022       Reactions   Mushroom Extract Complex Hives, Nausea And Vomiting, Rash   Penicillins Hives, Nausea And Vomiting, Rash        Medication List     TAKE these medications    aspirin EC 81 MG tablet Take 1 tablet (81 mg total) by mouth daily. Swallow whole.   atorvastatin 80 MG tablet Commonly known as: LIPITOR Take 1 tablet (80 mg total) by mouth daily.   blood glucose meter  kit and supplies Kit Dispense based on patient and insurance preference. Use up to four times daily as directed.   clopidogrel 75 MG tablet Commonly known as: PLAVIX Take 1 tablet (75 mg total) by mouth daily for 21 days.   Excedrin Migraine 250-250-65 MG tablet Generic drug: aspirin-acetaminophen-caffeine Take 1 tablet by mouth every 6 (six) hours as needed for migraine. What changed:  how much to take when to take this   glipiZIDE 5 MG tablet Commonly known as: Glucotrol Take 1 tablet (5 mg total) by mouth daily before breakfast.   lisinopril 20 MG tablet Commonly known as: ZESTRIL Take 1 tablet (20 mg total) by mouth daily.   TYLENOL 500 MG tablet Generic drug: acetaminophen Take 500-1,000 mg by mouth every 6 (six) hours as needed for mild pain.           TOTAL DISCHARGE TIME: 35 minutes  Farley Crooker [employer] on [email or web address]  05/06/2022, 12:50 PM

## 2022-05-07 ENCOUNTER — Other Ambulatory Visit (HOSPITAL_COMMUNITY): Payer: Self-pay

## 2022-05-07 ENCOUNTER — Other Ambulatory Visit: Payer: Self-pay | Admitting: Physician Assistant

## 2022-05-07 DIAGNOSIS — I639 Cerebral infarction, unspecified: Secondary | ICD-10-CM

## 2022-05-07 MED ORDER — ACCU-CHEK GUIDE W/DEVICE KIT: PACK | Freq: Four times a day (QID) | 0 refills | Status: DC

## 2022-05-07 MED ORDER — ACCU-CHEK SOFTCLIX LANCETS MISC: Freq: Four times a day (QID) | 0 refills | Status: DC

## 2022-05-07 MED ORDER — GLUCOSE BLOOD VI STRP: ORAL_STRIP | Freq: Four times a day (QID) | 0 refills | Status: DC

## 2022-05-09 ENCOUNTER — Encounter: Payer: Self-pay | Admitting: *Deleted

## 2022-05-09 NOTE — Progress Notes (Signed)
Patient ID: Regina Cantu, female   DOB: Aug 12, 1980, 41 y.o.   MRN: [id number] Patient enrolled for [employer] to ship a 30 day cardiac event monitor to her address on file.  Letter with instructions and self pay discount program information sent to patient via MyChart.

## 2022-05-25 ENCOUNTER — Ambulatory Visit: Payer: Medicaid Other | Attending: Physician Assistant

## 2022-05-25 DIAGNOSIS — I639 Cerebral infarction, unspecified: Secondary | ICD-10-CM

## 2022-05-31 ENCOUNTER — Other Ambulatory Visit (HOSPITAL_COMMUNITY): Payer: Self-pay

## 2022-06-01 ENCOUNTER — Emergency Department (HOSPITAL_COMMUNITY): Payer: Self-pay

## 2022-06-01 ENCOUNTER — Other Ambulatory Visit: Payer: Self-pay

## 2022-06-01 ENCOUNTER — Emergency Department (HOSPITAL_COMMUNITY)
Admission: EM | Admit: 2022-06-01 | Discharge: 2022-06-01 | Disposition: A | Discharge status: Home / Self Care | Payer: Self-pay | Attending: Emergency Medicine | Admitting: Emergency Medicine

## 2022-06-01 ENCOUNTER — Other Ambulatory Visit (HOSPITAL_COMMUNITY): Payer: Self-pay

## 2022-06-01 ENCOUNTER — Encounter (HOSPITAL_COMMUNITY): Payer: Self-pay | Admitting: Emergency Medicine

## 2022-06-01 DIAGNOSIS — Z7984 Long term (current) use of oral hypoglycemic drugs: Secondary | ICD-10-CM | POA: Insufficient documentation

## 2022-06-01 DIAGNOSIS — Y903 Blood alcohol level of 60-79 mg/100 ml: Secondary | ICD-10-CM | POA: Insufficient documentation

## 2022-06-01 DIAGNOSIS — E876 Hypokalemia: Secondary | ICD-10-CM | POA: Insufficient documentation

## 2022-06-01 DIAGNOSIS — Z7982 Long term (current) use of aspirin: Secondary | ICD-10-CM | POA: Insufficient documentation

## 2022-06-01 DIAGNOSIS — N390 Urinary tract infection, site not specified: Secondary | ICD-10-CM | POA: Insufficient documentation

## 2022-06-01 DIAGNOSIS — B9689 Other specified bacterial agents as the cause of diseases classified elsewhere: Secondary | ICD-10-CM | POA: Insufficient documentation

## 2022-06-01 DIAGNOSIS — I639 Cerebral infarction, unspecified: Secondary | ICD-10-CM | POA: Insufficient documentation

## 2022-06-01 DIAGNOSIS — R202 Paresthesia of skin: Secondary | ICD-10-CM | POA: Insufficient documentation

## 2022-06-01 DIAGNOSIS — Z91040 Latex allergy status: Secondary | ICD-10-CM | POA: Insufficient documentation

## 2022-06-01 DIAGNOSIS — E119 Type 2 diabetes mellitus without complications: Secondary | ICD-10-CM | POA: Insufficient documentation

## 2022-06-01 DIAGNOSIS — F444 Conversion disorder with motor symptom or deficit: Secondary | ICD-10-CM

## 2022-06-01 DIAGNOSIS — Z8543 Personal history of malignant neoplasm of ovary: Secondary | ICD-10-CM | POA: Insufficient documentation

## 2022-06-01 DIAGNOSIS — I10 Essential (primary) hypertension: Secondary | ICD-10-CM | POA: Insufficient documentation

## 2022-06-01 DIAGNOSIS — Z79899 Other long term (current) drug therapy: Secondary | ICD-10-CM | POA: Insufficient documentation

## 2022-06-01 LAB — COMPREHENSIVE METABOLIC PANEL
ALT: 39 U/L (ref 0–44)
AST: 20 U/L (ref 15–41)
Albumin: 3.9 g/dL (ref 3.5–5.0)
Alkaline Phosphatase: 96 U/L (ref 38–126)
Anion gap: 10 (ref 5–15)
BUN: 10 mg/dL (ref 6–20)
CO2: 23 mmol/L (ref 22–32)
Calcium: 9 mg/dL (ref 8.9–10.3)
Chloride: 105 mmol/L (ref 98–111)
Creatinine, Ser: 0.53 mg/dL (ref 0.44–1.00)
GFR, Estimated: >60 mL/min (ref >60)
Glucose, Bld: 209 mg/dL — ABNORMAL HIGH (ref 70–99)
Potassium: 3.3 mmol/L — ABNORMAL LOW (ref 3.5–5.1)
Sodium: 138 mmol/L (ref 135–145)
Total Bilirubin: 0.4 mg/dL (ref 0.3–1.2)
Total Protein: 6.9 g/dL (ref 6.5–8.1)

## 2022-06-01 LAB — ETHANOL: Alcohol, Ethyl (B): 76 mg/dL — ABNORMAL HIGH (ref <10)

## 2022-06-01 LAB — URINALYSIS, ROUTINE W REFLEX MICROSCOPIC
Bilirubin Urine: NEGATIVE
Glucose, UA: NEGATIVE mg/dL
Hgb urine dipstick: NEGATIVE
Ketones, ur: NEGATIVE mg/dL
Nitrite: POSITIVE — AB
Protein, ur: NEGATIVE mg/dL
Specific Gravity, Urine: 1.039 — ABNORMAL HIGH (ref 1.005–1.030)
pH: 6 (ref 5.0–8.0)

## 2022-06-01 LAB — DIFFERENTIAL
Abs Immature Granulocytes: 0.03 10*3/uL (ref 0.00–0.07)
Basophils Absolute: 0 10*3/uL (ref 0.0–0.1)
Basophils Relative: 0 %
Eosinophils Absolute: 0.3 10*3/uL (ref 0.0–0.5)
Eosinophils Relative: 3 %
Immature Granulocytes: 0 %
Lymphocytes Relative: 44 %
Lymphs Abs: 3.9 10*3/uL (ref 0.7–4.0)
Monocytes Absolute: 0.5 10*3/uL (ref 0.1–1.0)
Monocytes Relative: 6 %
Neutro Abs: 4.1 10*3/uL (ref 1.7–7.7)
Neutrophils Relative %: 47 %

## 2022-06-01 LAB — RAPID URINE DRUG SCREEN, HOSP PERFORMED
Amphetamines: NOT DETECTED
Barbiturates: NOT DETECTED
Benzodiazepines: NOT DETECTED
Cocaine: NOT DETECTED
Opiates: NOT DETECTED
Tetrahydrocannabinol: POSITIVE — AB

## 2022-06-01 LAB — CBG MONITORING, ED: Glucose-Capillary: 208 mg/dL — ABNORMAL HIGH (ref 70–99)

## 2022-06-01 LAB — APTT: aPTT: 27 seconds (ref 24–36)

## 2022-06-01 LAB — I-STAT CHEM 8, ED
BUN: 9 mg/dL (ref 6–20)
Calcium, Ion: 1.13 mmol/L — ABNORMAL LOW (ref 1.15–1.40)
Chloride: 101 mmol/L (ref 98–111)
Creatinine, Ser: 0.5 mg/dL (ref 0.44–1.00)
Glucose, Bld: 212 mg/dL — ABNORMAL HIGH (ref 70–99)
HCT: 41 % (ref 36.0–46.0)
Hemoglobin: 13.9 g/dL (ref 12.0–15.0)
Potassium: 3.3 mmol/L — ABNORMAL LOW (ref 3.5–5.1)
Sodium: 140 mmol/L (ref 135–145)
TCO2: 23 mmol/L (ref 22–32)

## 2022-06-01 LAB — PROTIME-INR
INR: 1 (ref 0.8–1.2)
Prothrombin Time: 13.5 seconds (ref 11.4–15.2)

## 2022-06-01 LAB — CBC
HCT: 38.9 % (ref 36.0–46.0)
Hemoglobin: 14.1 g/dL (ref 12.0–15.0)
MCH: 30.5 pg (ref 26.0–34.0)
MCHC: 36.2 g/dL — ABNORMAL HIGH (ref 30.0–36.0)
MCV: 84 fL (ref 80.0–100.0)
Platelets: 362 10*3/uL (ref 150–400)
RBC: 4.63 MIL/uL (ref 3.87–5.11)
RDW: 11.8 % (ref 11.5–15.5)
WBC: 8.9 10*3/uL (ref 4.0–10.5)
nRBC: 0 % (ref 0.0–0.2)

## 2022-06-01 LAB — I-STAT BETA HCG BLOOD, ED (MC, WL, AP ONLY): I-stat hCG, quantitative: <5 m[IU]/mL (ref <5)

## 2022-06-01 MED ORDER — IOHEXOL 350 MG/ML SOLN
75.0000 mL | Freq: Once | INTRAVENOUS | Status: AC | PRN
  Administered 2022-06-01: 75 mL via INTRAVENOUS

## 2022-06-01 MED ORDER — CEPHALEXIN 500 MG PO CAPS
500.0000 mg | ORAL_CAPSULE | Freq: Three times a day (TID) | ORAL | 0 refills | Status: DC
  Filled 2022-06-01: qty 21, 7d supply, fill #0

## 2022-06-01 MED ORDER — CEPHALEXIN 250 MG PO CAPS
500.0000 mg | ORAL_CAPSULE | Freq: Once | ORAL | Status: AC
  Administered 2022-06-01: 500 mg via ORAL
  Filled 2022-06-01: qty 2

## 2022-06-01 MED ORDER — POTASSIUM CHLORIDE CRYS ER 20 MEQ PO TBCR
20.0000 meq | EXTENDED_RELEASE_TABLET | Freq: Two times a day (BID) | ORAL | 0 refills | Status: DC
  Filled 2022-06-01: qty 10, 5d supply, fill #0

## 2022-06-01 MED ORDER — POTASSIUM CHLORIDE CRYS ER 20 MEQ PO TBCR
40.0000 meq | EXTENDED_RELEASE_TABLET | Freq: Once | ORAL | Status: AC
  Administered 2022-06-01: 40 meq via ORAL
  Filled 2022-06-01: qty 2

## 2022-06-01 NOTE — Discharge Instructions (Signed)
Return if you have any new or concerning symptoms.

## 2022-06-01 NOTE — ED Notes (Signed)
Patient remains in MRI 

## 2022-06-01 NOTE — ED Triage Notes (Signed)
Patient here with facial numbness since 1208 tonight.  Patient states she had a stroke on 9/30 and states that this feels like her stroke on 9/30.  She has the numbness on her face, lips and right arm.  Right arm feels heavy.  Family noticed that she stopped talking in the middle of a sentence and he said she had slurred speech and facial droop.

## 2022-06-01 NOTE — ED Provider Triage Note (Signed)
Emergency Medicine Provider Triage Evaluation Note  Erikka Larah Kuntzman , a 41 y.o. female  was evaluated in triage.  Pt complains of facial numbness and numbness of the LUE which began at [place], per partner. Partner reports that patient has had some dysarthria as well. Patient feels as though her extremities are "heavy". Hx of CVA 1 month ago. Currently has a loop recorder. Ran out of her 21-day supply of Plavix about 1 week ago.  Review of Systems  Positive: As above Negative: As above  Physical Exam  BP (!) 182/102 (BP Location: Right Arm)   Pulse 89   Temp 98.2 F (36.8 C) (Oral)   Resp 15   SpO2 100%  Gen:   Awake, anxious appearing, tearful Resp:  Normal effort  MSK:   Moves extremities without difficulty  Other:  GCS 15. Mild dysarthria, though most of speech is clear. Symmetric eyebrow raise, no facial drooping, tongue midline. Strength and sensation exam in triage is challenging as patient is uncooperative, though no unilateral weakness appreciated. Gait not assessed.   Medical Decision Making  Medically screening exam initiated at 1:38 AM.  Appropriate orders placed.  Tenesha Zakaria Fromer was informed that the remainder of the evaluation will be completed by another provider, this initial triage assessment does not replace that evaluation, and the importance of remaining in the ED until their evaluation is complete.  Facial and RUE paresthesias - code stroke called in triage. Patient transferred to Trauma A.   Antonietta Breach, PA-C 06/01/22 [phone]

## 2022-06-01 NOTE — Code Documentation (Signed)
Responded to Code Stroke called on pt in triage for facial droop, slurred speech, and numbness, LSN-0008. CBG-208, NIH-5, CT head neg, CTA-no LVO. TNK not given-recent stroke. Plan MRI.

## 2022-06-01 NOTE — ED Provider Notes (Signed)
[facility] EMERGENCY DEPARTMENT Provider Note   CSN: [id number] Arrival date & time: 06/01/22  0119  An emergency department physician performed an initial assessment on this suspected stroke patient at 0146.  History  Chief Complaint  Patient presents with   Code Stroke    Regina Cantu is a 41 y.o. female.  The history is provided by the patient and a relative.  She has history of hypertension, diabetes, stroke, ovarian cancer and comes in because of concern for new stroke.  At 12:08 AM, she suddenly developed numbness in both sides of her face and the right arm.  She denies any headache and denies any weakness.  She was in the hospital last month and had several strokes and is concerned that she is having another one.   Home Medications Prior to Admission medications   Medication Sig Start Date End Date Taking? Authorizing Provider  aspirin EC 81 MG tablet Take 1 tablet (81 mg total) by mouth daily. Swallow whole. 05/05/22 09/02/22 Yes Bonnielee Haff, MD  atorvastatin (LIPITOR) 80 MG tablet Take 1 tablet (80 mg total) by mouth daily. 05/06/22  Yes Bonnielee Haff, MD  glipiZIDE (GLUCOTROL) 5 MG tablet Take 1 tablet (5 mg total) by mouth daily before breakfast. 05/05/22  Yes Bonnielee Haff, MD  lisinopril (ZESTRIL) 20 MG tablet Take 1 tablet (20 mg total) by mouth daily. 05/05/22 08/03/22 Yes Bonnielee Haff, MD  TYLENOL 500 MG tablet Take 500-1,000 mg by mouth every 6 (six) hours as needed for mild pain.   Yes [provider]  Accu-Chek Softclix Lancets lancets Use to test blood sugar 4 (four) times daily. 05/05/22   Bonnielee Haff, MD  blood glucose meter kit and supplies KIT Dispense based on patient and insurance preference. Use up to four times daily as directed. 05/05/22   Bonnielee Haff, MD  Blood Glucose Monitoring Suppl (ACCU-CHEK GUIDE) w/Device KIT Use to test up to 4 (four) times daily as directed 05/05/22   Bonnielee Haff, MD  glucose blood  test strip Use to check blood sugar 4 (four) times daily. 05/05/22   Bonnielee Haff, MD      Allergies    Bee venom, Mushroom extract complex, Wasp venom, Latex, and Penicillins    Review of Systems   Review of Systems  All other systems reviewed and are negative.   Physical Exam Updated Vital Signs BP (!) 165/96   Pulse 98   Temp 98.2 F (36.8 C) (Oral)   Resp 18   SpO2 99%  Physical Exam Vitals and nursing note reviewed.   41 year old female, resting comfortably and in no acute distress. Vital signs are significant for elevated blood pressure. Oxygen saturation is 99%, which is normal. Head is normocephalic and atraumatic. PERRLA, EOMI. Oropharynx is clear. Neck is nontender and supple without adenopathy or JVD.  There are no carotid bruits. Back is nontender and there is no CVA tenderness. Lungs are clear without rales, wheezes, or rhonchi. Chest is nontender. Heart has regular rate and rhythm without murmur. Abdomen is soft, flat, nontender. Extremities have no cyanosis or edema, full range of motion is present. Skin is warm and dry without rash. Neurologic: Awake and alert.  Speech is normal.  There is no facial asymmetry.  She complains of decreased sensation diffusely throughout the face, on both sides.  Right hand grip is significantly weaker than left hand grip.  She will not cooperate for pronator drift testing stating that both arms feel heavy.  She will not raise her right leg on command, but will raise her left leg.  She complains of decreased sensation diffusely through her right arm but normal sensation in the left arm, left leg, right leg.  ED Results / Procedures / Treatments   Labs (all labs ordered are listed, but only abnormal results are displayed) Labs Reviewed  ETHANOL - Abnormal; Notable for the following components:      Result Value   Alcohol, Ethyl (B) 76 (*)    All other components within normal limits  CBC - Abnormal; Notable for the following  components:   MCHC 36.2 (*)    All other components within normal limits  COMPREHENSIVE METABOLIC PANEL - Abnormal; Notable for the following components:   Potassium 3.3 (*)    Glucose, Bld 209 (*)    All other components within normal limits  RAPID URINE DRUG SCREEN, HOSP PERFORMED - Abnormal; Notable for the following components:   Tetrahydrocannabinol POSITIVE (*)    All other components within normal limits  URINALYSIS, ROUTINE W REFLEX MICROSCOPIC - Abnormal; Notable for the following components:   Specific Gravity, Urine 1.039 (*)    Nitrite POSITIVE (*)    Leukocytes,Ua TRACE (*)    Bacteria, UA MANY (*)    All other components within normal limits  I-STAT CHEM 8, ED - Abnormal; Notable for the following components:   Potassium 3.3 (*)    Glucose, Bld 212 (*)    Calcium, Ion 1.13 (*)    All other components within normal limits  CBG MONITORING, ED - Abnormal; Notable for the following components:   Glucose-Capillary 208 (*)    All other components within normal limits  PROTIME-INR  APTT  DIFFERENTIAL  I-STAT BETA HCG BLOOD, ED ([facility], [facility], [facility] ONLY)    EKG EKG Interpretation  Date/Time:  Friday June 01 2022 01:32:34 EDT Ventricular Rate:  85 PR Interval:  176 QRS Duration: 80 QT Interval:  348 QTC Calculation: 414 R Axis:   36 Text Interpretation: Normal sinus rhythm Possible Anterior infarct , age undetermined Abnormal ECG When compared with ECG of 04-May-2022 14:42, No significant change was found Confirmed by Delora Fuel ([id number]) on 06/01/2022 3:30:47 AM  Radiology MR BRAIN WO CONTRAST  Result Date: 06/01/2022 CLINICAL DATA:  41 year old female code stroke presentation this morning. History of right MCA territory small-vessel ischemia in September, left PCA stenosis. EXAM: MRI HEAD WITHOUT CONTRAST TECHNIQUE: Multiplanar, multiecho pulse sequences of the brain and surrounding structures were obtained without intravenous contrast. COMPARISON:  Head CT and CTA  earlier today.  Brain MRI 05/04/2022. FINDINGS: Brain: No restricted diffusion to suggest acute infarction. No midline shift, mass effect, evidence of mass lesion, ventriculomegaly, extra-axial collection or acute intracranial hemorrhage. Cervicomedullary junction and pituitary are within normal limits. Expected evolution of right corona radiata and basal ganglia lacunar infarcts last month with residual T2 and FLAIR hyperintensity (right posterior lentiform series 10, image 14). No chronic blood products. Elsewhere gray and white matter signal remains normal for age. Vascular: Major intracranial vascular flow voids are stable. Skull and upper cervical spine: Stable, negative. Sinuses/Orbits: Stable, negative. Other: Visible internal auditory structures appear normal. Visible scalp and face appear stable and within normal limits. IMPRESSION: 1. No acute intracranial abnormality. 2. Expected evolution of right corona radiata and basal ganglia small-vessel ischemia last month. Electronically Signed   By: Genevie Ann M.D.   On: 06/01/2022 06:10   CT ANGIO HEAD NECK W WO CM (CODE STROKE)  Result Date: 06/01/2022  CLINICAL DATA:  Right-sided weakness and perioral numbness EXAM: CT ANGIOGRAPHY HEAD AND NECK TECHNIQUE: Multidetector CT imaging of the head and neck was performed using the standard protocol during bolus administration of intravenous contrast. Multiplanar CT image reconstructions and MIPs were obtained to evaluate the vascular anatomy. Carotid stenosis measurements (when applicable) are obtained utilizing NASCET criteria, using the distal internal carotid diameter as the denominator. RADIATION DOSE REDUCTION: This exam was performed according to the departmental dose-optimization program which includes automated exposure control, adjustment of the mA and/or kV according to patient size and/or use of iterative reconstruction technique. CONTRAST:  35m OMNIPAQUE IOHEXOL 350 MG/ML SOLN COMPARISON:  05/04/2022  CTA head and neck, correlation is also made with CT head 06/01/2022 FINDINGS: CT HEAD FINDINGS For noncontrast findings, please see same day CT head. CTA NECK FINDINGS Aortic arch: Standard branching. Imaged portion shows no evidence of aneurysm or dissection. No significant stenosis of the major arch vessel origins. Right carotid system: No evidence of dissection, occlusion, or hemodynamically significant stenosis (greater than 50%). Left carotid system: No evidence of dissection, occlusion, or hemodynamically significant stenosis (greater than 50%). Vertebral arteries: The origin of the right vertebral artery is difficult to visualize due to beam hardening artifact from contrast in adjacent veins. Within this limitation, no evidence of dissection, occlusion, or hemodynamically significant stenosis in the bilateral vertebral arteries. Skeleton: No acute osseous abnormality. Other neck: Small hypoenhancing nodules in the thyroid (< 1.5 cm), for which no follow-up is indicated. (Reference: J Am Coll Radiol. 2015 Feb;12(2): 143-50) Upper chest: No focal pulmonary opacity or pleural effusion. Review of the MIP images confirms the above findings CTA HEAD FINDINGS Anterior circulation: Both internal carotid arteries are patent to the termini, without significant stenosis. Unchanged posteriorly directed protrusion from the left ICA near the terminus (series 7, image 106), favored to represent an infundibulum at the origin of a now occluded left posterior communicating artery. A1 segments patent. Normal anterior communicating artery. Anterior cerebral arteries are patent to their distal aspects. No M1 stenosis or occlusion. MCA branches irregular but perfused and grossly symmetric. Posterior circulation: In the right vertebral artery primarily terminates in the right PICA. The left V4 is patent to the vertebrobasilar junction with mild irregularity distally. Mild focal stenosis in the mid basilar (series 7, image 131),  unchanged. Basilar otherwise patent to its distal aspect. Superior cerebellar arteries patent proximally. Patent P1 segments, diminutive on the right. Near fetal origin of the right PCA from a prominent right posterior communicating artery. Mild stenosis in the distal left P 2 (series 7, image 104). PCAs otherwise perfused to their distal aspects without stenosis. The left posterior communicating artery is not patent. Venous sinuses: As permitted by contrast timing, patent. Anatomic variants: None significant. Review of the MIP images confirms the above findings IMPRESSION: 1. No intracranial large vessel occlusion. 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 2. No hemodynamically significant stenosis in the neck. These findings were discussed by telephone on 06/01/2022 at 2:28 am with provider [facility]. Electronically Signed   By: AMerilyn BabaM.D.   On: 06/01/2022 02:37   CT HEAD CODE STROKE WO CONTRAST  Result Date: 06/01/2022 CLINICAL DATA:  Code stroke. EXAM: CT HEAD WITHOUT CONTRAST TECHNIQUE: Contiguous axial images were obtained from the base of the skull through the vertex without intravenous contrast. RADIATION DOSE REDUCTION: This exam was performed according to the departmental dose-optimization program which includes automated exposure control, adjustment of the mA  and/or kV according to patient size and/or use of iterative reconstruction technique. COMPARISON:  05/04/2022 CTA, correlation is also made with 05/04/2022 MRI head FINDINGS: Brain: No evidence of acute infarction, hemorrhage, cerebral edema, mass, mass effect, or midline shift. No hydrocephalus or extra-axial collection. Hypodensity in the posterior right lentiform nucleus correlates with the acute infarcts seen on 05/04/2022. Vascular: No hyperdense vessel. Skull: Negative for fracture or focal lesion. Sinuses/Orbits: Mucosal thickening in the right maxillary sinus and ethmoid air cells. The  orbits are unremarkable. Other: The mastoid air cells are well aerated. ASPECTS [facility] Stroke Program Early CT Score) - Ganglionic level infarction (caudate, lentiform nuclei, internal capsule, insula, M1-M3 cortex): 7 - Supraganglionic infarction (M4-M6 cortex): 3 Total score (0-10 with 10 being normal): 10 IMPRESSION: No acute intracranial process. ASPECTS is 10. Code stroke imaging results were communicated on 06/01/2022 at 2:03 am to provider Dr. Lorrin Goodell via secure text paging. Electronically Signed   By: Merilyn Baba M.D.   On: 06/01/2022 02:04    Procedures Procedures  Cardiac monitor shows normal sinus rhythm, per my interpretation.  Medications Ordered in ED Medications  iohexol (OMNIPAQUE) 350 MG/ML injection 75 mL (75 mLs Intravenous Contrast Given 06/01/22 0220)  potassium chloride SA (KLOR-CON M) CR tablet 40 mEq (40 mEq Oral Given 06/01/22 0356)  cephALEXin (KEFLEX) capsule 500 mg (500 mg Oral Given 06/01/22 4174)    ED Course/ Medical Decision Making/ A&P                           Medical Decision Making Amount and/or Complexity of Data Reviewed Radiology: ordered.  Risk Prescription drug management.   New onset of right-sided weakness and numbness of the right arm and both sides of the face.  Although there is concern for acute stroke, pattern does not neatly fit into a single neurologic lesion.  Consider functional reaction.  Code stroke is activated and she is sent for emergent CT of head.  CT of head shows no acute process, aspects = 10.  On review of past records, she had been admitted for stroke on 05/04/2022 and she had been found to have a cerebral aneurysm.  CT angiogram was obtained today showing unchanged mild focal stenosis in the mid basilar artery and distal left P2, no large vessel occlusion no hemodynamically significant stenosis in the neck.  I have independently viewed all of these images, and agree with the radiologist's interpretation.  Neurology consult  is appreciated.  There is a strong suspicion for this being a functional episode and not an actual stroke.  However, MRI was ordered to look for evidence of new stroke.  I have reviewed and interpreted her laboratory test, my interpretation is mild hypokalemia, and have ordered a dose of oral potassium.  Normal CBC.  Ethanol level below the level of legal intoxication.  I have reviewed and interpreted the electrocardiogram, and my interpretation is age undetermined anterior infarct and otherwise normal, unchanged from prior.  Urinalysis shows evidence of infection with positive nitrite and many bacteria.  I have ordered a dose of cephalexin.  MRI shows no evidence of new stroke.  Patient is advised of this.  Her paresthesias are improving, but are still present.  This is still felt to be functional.  No indication for hospitalization.  I am discharging the patient with prescriptions for cephalexin and oral potassium.  She has a follow-up appointment already scheduled with neurology and she is to keep that appointment.  Return precautions discussed.  CRITICAL CARE Performed by: Delora Fuel Total critical care time: 40 minutes Critical care time was exclusive of separately billable procedures and treating other patients. Critical care was necessary to treat or prevent imminent or life-threatening deterioration. Critical care was time spent personally by me on the following activities: development of treatment plan with patient and/or surrogate as well as nursing, discussions with consultants, evaluation of patient's response to treatment, examination of patient, obtaining history from patient or surrogate, ordering and performing treatments and interventions, ordering and review of laboratory studies, ordering and review of radiographic studies, pulse oximetry and re-evaluation of patient's condition.  Final Clinical Impression(s) / ED Diagnoses Final diagnoses:  Paresthesia  Elevated blood pressure  reading with diagnosis of hypertension  Hypokalemia  Urinary tract infection without hematuria, site unspecified    Rx / DC Orders ED Discharge Orders          Ordered    potassium chloride SA (KLOR-CON M) 20 MEQ tablet  2 times daily        06/01/22 0643    cephALEXin (KEFLEX) 500 MG capsule  3 times daily        06/01/22 6712              Delora Fuel, MD 45/80/99 [phone]

## 2022-06-01 NOTE — Consult Note (Signed)
NEUROLOGY CONSULTATION NOTE   Date of service: June 01, 2022 Patient Name: Regina Cantu MRN:  [id number] DOB:  10-01-1980 Reason for consult: "R sided weakness and numbness and perioral numbness" Requesting Provider: Delora Fuel, MD _ _ _   _ __   _ __ _ _  __ __   _ __   __ _  History of Present Illness  Regina Cantu is a 41 y.o. female with PMH significant for HTN, migraine, poorly controlled DM2, ovarian cancer status post hysterectomy, recent right basal ganglia and right centrum semiovale about a month ago who presents with sudden onset perioral numbness, R arm and R Leg weakness and numbness.  Symptoms started abrruptly at midnight.  She reports RUE and RLE weakness which appears to improve when distracted.  LKW: 06/01/22. mRS: 0 tNKASE: not offered 2/2 recent stroke and lowe overall suspicion for stroke. Thrombectomy: not offered, no LVO NIHSS components Score: Comment  1a Level of Conscious 0'[x]'$  1'[]'$  2'[]'$  3'[]'$      1b LOC Questions 0'[x]'$  1'[]'$  2'[]'$       1c LOC Commands 0'[x]'$  1'[]'$  2'[]'$       2 Best Gaze 0'[x]'$  1'[]'$  2'[]'$       3 Visual 0'[x]'$  1'[]'$  2'[]'$  3'[]'$      4 Facial Palsy 0'[x]'$  1'[]'$  2'[]'$  3'[]'$      5a Motor Arm - left 0'[x]'$  1'[]'$  2'[]'$  3'[]'$  4'[]'$  UN'[]'$    5b Motor Arm - Right 0'[]'$  1'[]'$  2'[]'$  3'[x]'$  4'[]'$  UN'[]'$    6a Motor Leg - Left 0'[x]'$  1'[]'$  2'[]'$  3'[]'$  4'[]'$  UN'[]'$    6b Motor Leg - Right 0'[x]'$  1'[]'$  2'[]'$  3'[]'$  4'[]'$  UN'[]'$    7 Limb Ataxia 0'[x]'$  1'[]'$  2'[]'$  3'[]'$  UN'[]'$     8 Sensory 0'[]'$  1'[]'$  2'[x]'$  UN'[]'$      9 Best Language 0'[x]'$  1'[]'$  2'[]'$  3'[]'$      10 Dysarthria 0'[x]'$  1'[]'$  2'[]'$  UN'[]'$      11 Extinct. and Inattention 0'[x]'$  1'[]'$  2'[]'$       TOTAL: 5     ROS   Constitutional Denies weight loss, fever and chills.   HEENT Denies changes in vision and hearing.   Respiratory Denies SOB and cough.   CV Denies palpitations and CP   GI Denies abdominal pain, nausea, vomiting and diarrhea.   GU Denies dysuria and urinary frequency.   MSK Denies myalgia and joint pain.   Skin Denies rash and pruritus.   Neurological Denies headache and syncope.    Psychiatric Denies recent changes in mood. Denies anxiety and depression.    Past History   Past Medical History:  Diagnosis Date   Cancer ([place])    ovarian 2001 and 2010   Essential hypertension 05/04/2022   Hypertension    Migraine    Pityriasis rosea    Uncontrolled type 2 diabetes mellitus with hyperglycemia ([place]) 05/04/2022   Past Surgical History:  Procedure Laterality Date   ABDOMINAL HYSTERECTOMY     OVARIAN CANCER     Family History  Problem Relation Age of Onset   Hyperlipidemia Mother    Hypertension Mother    Diabetes Mother    Stroke Mother    Cerebral aneurysm Mother    Cerebral aneurysm Father    Cancer Father    Stroke Father    Breast cancer Maternal Aunt 65   Social History   Socioeconomic History   Marital status: Single    Spouse name: Not on file   Number of children: Not on file   Years of education: Not on file   Highest  education level: Not on file  Occupational History   Not on file  Tobacco Use   Smoking status: Every Day    Packs/day: 0.25    Types: Cigarettes   Smokeless tobacco: Never  Substance and Sexual Activity   Alcohol use: Yes    Alcohol/week: 14.0 standard drinks of alcohol    Types: 14 Shots of liquor per week   Drug use: No   Sexual activity: Not on file  Other Topics Concern   Not on file  Social History Narrative   Not on file   Social Determinants of Health   Financial Resource Strain: Not on file  Food Insecurity: Not on file  Transportation Needs: Not on file  Physical Activity: Not on file  Stress: Not on file  Social Connections: Not on file   Allergies  Allergen Reactions   Bee Venom Anaphylaxis   Mushroom Extract Complex Anaphylaxis   Wasp Venom Anaphylaxis   Latex Rash   Penicillins Hives, Nausea And Vomiting and Rash    Medications  (Not in a hospital admission)    Vitals   Vitals:   06/01/22 0133  BP: (!) 182/102  Pulse: 89  Resp: 15  Temp: 98.2 F (36.8 C)  TempSrc: Oral  SpO2:  100%     There is no height or weight on file to calculate BMI.  Physical Exam   General: Laying comfortably in bed; in no acute distress.  HENT: Normal oropharynx and mucosa. Normal external appearance of ears and nose.  Neck: Supple, no pain or tenderness  CV: No JVD. No peripheral edema.  Pulmonary: Symmetric Chest rise. Normal respiratory effort.  Abdomen: Soft to touch, non-tender.  Ext: No cyanosis, edema, or deformity  Skin: No rash. Normal palpation of skin.   Musculoskeletal: Normal digits and nails by inspection. No clubbing.   Neurologic Examination  Mental status/Cognition: Alert, oriented to self, place, month and year, good attention.  Speech/language: Fluent, comprehension intact, object naming intact, repetition intact.  Cranial nerves:   CN II Pupils equal and reactive to light, no VF deficits    CN III,IV,VI EOM intact, no gaze preference or deviation, no nystagmus    CN V normal sensation in V1, V2, and V3 segments bilaterally    CN VII no asymmetry, no nasolabial fold flattening    CN VIII normal hearing to speech    CN IX & X normal palatal elevation, no uvular deviation    CN XI 5/5 head turn and 5/5 shoulder shrug bilaterally    CN XII midline tongue protrusion    Motor:  Muscle bulk: normal, tone normal, pronator drift none tremor none Mvmt Root Nerve  Muscle Right Left Comments  SA C5/6 Ax Deltoid 2 5   EF C5/6 Mc Biceps 2 5   EE C6/7/8 Rad Triceps 2 5   WF C6/7 Med FCR     WE C7/8 PIN ECU     F Ab C8/T1 U ADM/FDI 2 5   HF L1/2/3 Fem Illopsoas 4+ 4+   KE L2/3/4 Fem Quad 5 5   DF L4/5 D Peron Tib Ant 4+ 4+   PF S1/2 Tibial Grc/Sol 5 5    Sensation:  Light touch Decreased in R face, RUE and RLE   Pin prick    Temperature    Vibration   Proprioception    Coordination/Complex Motor:  - Finger to Nose intact BL - Heel to shin unable to do - Rapid alternating movement are slowed throughout -  Gait: Deferred for patient safety.  Labs   CBC:   Recent Labs  Lab 06/01/22 0142 06/01/22 0149  WBC 8.9  --   NEUTROABS 4.1  --   HGB 14.1 13.9  HCT 38.9 41.0  MCV 84.0  --   PLT 362  --     Basic Metabolic Panel:  Lab Results  Component Value Date   NA 140 06/01/2022   K 3.3 (L) 06/01/2022   CO2 26 05/04/2022   GLUCOSE 212 (H) 06/01/2022   BUN 9 06/01/2022   CREATININE 0.50 06/01/2022   CALCIUM 9.6 05/04/2022   GFRNONAA >60 05/04/2022   GFRAA >60 09/25/2018   Lipid Panel:  Lab Results  Component Value Date   LDLCALC 159 (H) 05/05/2022   HgbA1c:  Lab Results  Component Value Date   HGBA1C 8.4 (H) 05/05/2022   Urine Drug Screen:     Component Value Date/Time   LABOPIA NONE DETECTED 05/04/2022 1514   COCAINSCRNUR NONE DETECTED 05/04/2022 1514   LABBENZ NONE DETECTED 05/04/2022 1514   AMPHETMU NONE DETECTED 05/04/2022 1514   THCU POSITIVE (A) 05/04/2022 1514   LABBARB NONE DETECTED 05/04/2022 1514    Alcohol Level No results found for: "ETH"  CT Head without contrast(Personally reviewed): CTH was negative for a large hypodensity concerning for a large territory infarct or hyperdensity concerning for an ICH  CT angio Head and Neck with contrast(Personally reviewed): Not offered, no LVO  MRI Brain(Personally reviewed): Pending  Impression   Domonique Aliya Sol is a 41 y.o. female with PMH significant for HTN, migraine, poorly controlled DM2, ovarian cancer status post hysterectomy, recent right basal ganglia and right centrum semiovale about a month ago who presents with sudden onset perioral numbness, R arm and R Leg weakness and numbness.  Exam with huge functional overlay. Seen to be moving RUE fine when distracted or not being observed. Given stroke risk factors, will get MRI Brain wihtout contrast but if negative for an acute stroke, no further workup.  Recommendations  - MRI Brain without contrast. No further workup if negative for  stroke. ______________________________________________________________________   Thank you for the opportunity to take part in the care of this patient. If you have any further questions, please contact the neurology consultation attending.  Signed,  [place] Pager Number [phone] _ _ _   _ __   _ __ _ _  __ __   _ __   __ _

## 2022-06-01 NOTE — ED Notes (Signed)
Returned for MRI

## 2022-06-06 ENCOUNTER — Other Ambulatory Visit (HOSPITAL_COMMUNITY): Payer: Self-pay

## 2022-06-13 ENCOUNTER — Other Ambulatory Visit: Payer: Self-pay | Admitting: Physician Assistant

## 2022-06-13 ENCOUNTER — Other Ambulatory Visit: Payer: Self-pay | Admitting: Radiology

## 2022-06-13 DIAGNOSIS — I639 Cerebral infarction, unspecified: Secondary | ICD-10-CM

## 2022-06-20 ENCOUNTER — Other Ambulatory Visit (HOSPITAL_COMMUNITY): Payer: Self-pay

## 2022-06-20 ENCOUNTER — Ambulatory Visit: Payer: Commercial Managed Care - HMO | Admitting: Neurology

## 2022-06-20 ENCOUNTER — Encounter: Payer: Self-pay | Admitting: Neurology

## 2022-06-20 VITALS — BP 170/85 | HR 80 | Ht 62.0 in | Wt 221.0 lb

## 2022-06-20 DIAGNOSIS — Z9189 Other specified personal risk factors, not elsewhere classified: Secondary | ICD-10-CM

## 2022-06-20 DIAGNOSIS — E785 Hyperlipidemia, unspecified: Secondary | ICD-10-CM

## 2022-06-20 DIAGNOSIS — I639 Cerebral infarction, unspecified: Secondary | ICD-10-CM

## 2022-06-20 DIAGNOSIS — E119 Type 2 diabetes mellitus without complications: Secondary | ICD-10-CM

## 2022-06-20 NOTE — Progress Notes (Signed)
[facility] [address] [place]. [place] [address] [phone]       OFFICE CONSULT NOTE  [name] Date of Birth:  05-09-1981 Medical Record Number:  [id number]   Referring MD:  Bonnielee Haff  Reason for Referral:  Stroke  HPI: [name] is a pleasant 41 year old African-American lady seen today for initial office consultation visit for stroke.  History is obtained from the patient and review of electronic medical records.  I have personally reviewed pertinent available imaging films in PACS.  She has past medical history of diabetes, hypertension, migraines and ovarian cancer s/p hysterectomy who presented to emergency room on 05/05/2022 for evaluation for sudden onset of slurred speech and facial numbness and dysarthria which began in the morning at 1030 while she was at work.  She had trouble using her iPad speaking well.  She stated that her mouth and tongue felt numb and facial drooping.  Symptoms lasted about 30 minutes.  Scan of the brain showed small acute involving right basal ganglia and right centrum semiovale.  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  2D echo showed ejection fraction of 70 to 75% without cardiac source of embolism.  LDL cholesterol elevated 159 mg percent and hemoglobin A1c was 8.4.  Urine drug screen was positive for marijuana.  Patient was started on aspirin and Plavix for 3 weeks followed by aspirin alone.  She states her symptoms proved the next day and she was back to her baseline.  She is tolerating aspirin well without bruising or bleeding.  He states he is cut back as well as marijuana.  He is tolerating Lipitor well without muscle aches and pains.  States his sugars are also doing better.  He states blood pressure is doing fine but it is elevated in the office today at 170/85.  She appears to be at risk for obstructive sleep apnea  but she has never been tested for it.  She was seen again in the emergency room on 06/01/2022 for evaluation for sudden onset of perioral numbness and right-sided weakness and numbness symptoms were felt to be having nonorganic features improved when she was distracted MRI of the brain was repeated and did not show any acute abnormality expected evolutionary changes in the right basal ganglia and corona radiata infarcts are noted.  Repeat CT angiogram also shows no significant changes unchanged mild focal stenosis in the mid basilar artery and distal left P2 posterior cerebral artery ROS:   14 system review of systems is positive for slurred speech, numbness, weakness and all other systems negative  PMH:  Past Medical History:  Diagnosis Date   Cancer ([place])    ovarian 2001 and 2010   Essential hypertension 05/04/2022   Hypertension    Migraine    Pityriasis rosea    Uncontrolled type 2 diabetes mellitus with hyperglycemia ([place]) 05/04/2022    Social History:  Social History   Socioeconomic History   Marital status: Single    Spouse name: Not on file   Number of children: Not on file   Years of education: Not on file   Highest education level: Not on file  Occupational History   Not on file  Tobacco Use   Smoking status: Every Day    Packs/day: 0.25    Types: Cigarettes   Smokeless tobacco: Never  Substance and Sexual Activity   Alcohol use: Yes  Alcohol/week: 14.0 standard drinks of alcohol    Types: 14 Shots of liquor per week   Drug use: No   Sexual activity: Not on file  Other Topics Concern   Not on file  Social History Narrative   Not on file   Social Determinants of Health   Financial Resource Strain: Not on file  Food Insecurity: Not on file  Transportation Needs: Not on file  Physical Activity: Not on file  Stress: Not on file  Social Connections: Not on file  Intimate Partner Violence: Not on file    Medications:   Current Outpatient Medications on File  Prior to Visit  Medication Sig Dispense Refill   Accu-Chek Softclix Lancets lancets Use to test blood sugar 4 (four) times daily. 100 each 0   aspirin EC 81 MG tablet Take 1 tablet (81 mg total) by mouth daily. Swallow whole. 30 tablet 3   atorvastatin (LIPITOR) 80 MG tablet Take 1 tablet (80 mg total) by mouth daily. 30 tablet 3   blood glucose meter kit and supplies KIT Dispense based on patient and insurance preference. Use up to four times daily as directed. 1 each 0   Blood Glucose Monitoring Suppl (ACCU-CHEK GUIDE) w/Device KIT Use to test up to 4 (four) times daily as directed 1 kit 0   glipiZIDE (GLUCOTROL) 5 MG tablet Take 1 tablet (5 mg total) by mouth daily before breakfast. 30 tablet 1   glucose blood test strip Use to check blood sugar 4 (four) times daily. 50 each 0   lisinopril (ZESTRIL) 20 MG tablet Take 1 tablet (20 mg total) by mouth daily. 30 tablet 2   TYLENOL 500 MG tablet Take 500-1,000 mg by mouth every 6 (six) hours as needed for mild pain.     No current facility-administered medications on file prior to visit.    Allergies:   Allergies  Allergen Reactions   Bee Venom Anaphylaxis   Mushroom Extract Complex Anaphylaxis   Wasp Venom Anaphylaxis   Latex Rash   Penicillins Hives, Nausea And Vomiting and Rash    Physical Exam General: Obese middle-aged African-American lady seated, in no evident distress Head: head normocephalic and atraumatic.   Neck: supple with no carotid or supraclavicular bruits Cardiovascular: regular rate and rhythm, no murmurs Musculoskeletal: no deformity Skin:  no rash/petichiae Vascular:  Normal pulses all extremities  Neurologic Exam Mental Status: Awake and fully alert. Oriented to place and time. Recent and remote memory intact. Attention span, concentration and fund of knowledge appropriate. Mood and affect appropriate.  Cranial Nerves: Fundoscopic exam reveals sharp disc margins. Pupils equal, briskly reactive to light.  Extraocular movements full without nystagmus. Visual fields full to confrontation. Hearing intact. Facial sensation intact. Face, tongue, palate moves normally and symmetrically.  Motor: Normal bulk and tone. Normal strength in all tested extremity muscles. Sensory.: intact to touch , pinprick , position and vibratory sensation.  Coordination: Rapid alternating movements normal in all extremities. Finger-to-nose and heel-to-shin performed accurately bilaterally. Gait and Station: Arises from chair without difficulty. Stance is normal. Gait demonstrates normal stride length and balance . Able to heel, toe and tandem walk with mild difficulty.  Reflexes: 1+ and symmetric. Toes downgoing.   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  Vascular risk factors of obesity, smoking cigarettes and marijuana, diabetes, hypertension and hyperlipidemia and at risk for sleep apnea  PLAN: I had a long d/w patient and her husband about her recent  lacunar stroke, risk for recurrent stroke/TIAs, personally independently reviewed imaging studies and stroke evaluation results and answered questions.Continue aspirin 81 mg daily  for secondary stroke prevention and maintain strict control of hypertension with blood pressure goal below 130/90, diabetes with hemoglobin A1c goal below 6.5% and lipids with LDL cholesterol goal below 70 mg/dL. I also advised the patient to eat a healthy diet with plenty of whole grains, cereals, fruits and vegetables, exercise regularly and maintain ideal body weight.  I have counseled the patient to quit smoking completely as well as marijuana and she states she is willing.  Referral for polysomnogram for sleep apnea.  Followup in the future with my nurse practitioner in 6 months or call earlier if necessary. Greater than 50% time during this 45-minute consultation  visit was spent on counseling and coordination of care about her lacunar stroke and discussion about her multiple risk factors and stroke prevention  and answering questions. Antony Contras, MD Note: This document was prepared with digital dictation and possible smart phrase technology. Any transcriptional errors that result from this process are unintentional.

## 2022-06-20 NOTE — Patient Instructions (Signed)
I had a long d/w patient and her husband about her recent  lacunar stroke, risk for recurrent stroke/TIAs, personally independently reviewed imaging studies and stroke evaluation results and answered questions.Continue aspirin 81 mg daily  for secondary stroke prevention and maintain strict control of hypertension with blood pressure goal below 130/90, diabetes with hemoglobin A1c goal below 6.5% and lipids with LDL cholesterol goal below 70 mg/dL. I also advised the patient to eat a healthy diet with plenty of whole grains, cereals, fruits and vegetables, exercise regularly and maintain ideal body weight.  I have counseled the patient to quit smoking completely as well as marijuana and she states she is willing.  Referral for polysomnogram for sleep apnea.  Followup in the future with my nurse practitioner in 6 months or call earlier if necessary.  Stroke Prevention Some medical conditions and behaviors can lead to a higher chance of having a stroke. You can help prevent a stroke by eating healthy, exercising, not smoking, and managing any medical conditions you have. Stroke is a leading cause of functional impairment. Primary prevention is particularly important because a majority of strokes are first-time events. Stroke changes the lives of not only those who experience a stroke but also their family and other caregivers. How can this condition affect me? A stroke is a medical emergency and should be treated right away. A stroke can lead to brain damage and can sometimes be life-threatening. If a person gets medical treatment right away, there is a better chance of surviving and recovering from a stroke. What can increase my risk? The following medical conditions may increase your risk of a stroke: Cardiovascular disease. High blood pressure (hypertension). Diabetes. High cholesterol. Sickle cell disease. Blood clotting disorders (hypercoagulable state). Obesity. Sleep disorders (obstructive sleep  apnea). Other risk factors include: Being older than age 2. Having a history of blood clots, stroke, or mini-stroke (transient ischemic attack, TIA). Genetic factors, such as race, ethnicity, or a family history of stroke. Smoking cigarettes or using other tobacco products. Taking birth control pills, especially if you also use tobacco. Heavy use of alcohol or drugs, especially cocaine and methamphetamine. Physical inactivity. What actions can I take to prevent this? Manage your health conditions High cholesterol levels. Eating a healthy diet is important for preventing high cholesterol. If cholesterol cannot be managed through diet alone, you may need to take medicines. Take any prescribed medicines to control your cholesterol as told by your health care provider. Hypertension. To reduce your risk of stroke, try to keep your blood pressure below 130/80. Eating a healthy diet and exercising regularly are important for controlling blood pressure. If these steps are not enough to manage your blood pressure, you may need to take medicines. Take any prescribed medicines to control hypertension as told by your health care provider. Ask your health care provider if you should monitor your blood pressure at home. Have your blood pressure checked every year, even if your blood pressure is normal. Blood pressure increases with age and some medical conditions. Diabetes. Eating a healthy diet and exercising regularly are important parts of managing your blood sugar (glucose). If your blood sugar cannot be managed through diet and exercise, you may need to take medicines. Take any prescribed medicines to control your diabetes as told by your health care provider. Get evaluated for obstructive sleep apnea. Talk to your health care provider about getting a sleep evaluation if you snore a lot or have excessive sleepiness. Make sure that any other medical conditions  you have, such as atrial fibrillation or  atherosclerosis, are managed. Nutrition Follow instructions from your health care provider about what to eat or drink to help manage your health condition. These instructions may include: Reducing your daily calorie intake. Limiting how much salt (sodium) you use to 1,500 milligrams (mg) each day. Using only healthy fats for cooking, such as olive oil, canola oil, or sunflower oil. Eating healthy foods. You can do this by: Choosing foods that are high in fiber, such as whole grains, and fresh fruits and vegetables. Eating at least 5 servings of fruits and vegetables a day. Try to fill one-half of your plate with fruits and vegetables at each meal. Choosing lean protein foods, such as lean cuts of meat, poultry without skin, fish, tofu, beans, and nuts. Eating low-fat dairy products. Avoiding foods that are high in sodium. This can help lower blood pressure. Avoiding foods that have saturated fat, trans fat, and cholesterol. This can help prevent high cholesterol. Avoiding processed and prepared foods. Counting your daily carbohydrate intake.  Lifestyle If you drink alcohol: Limit how much you have to: 0-1 drink a day for women who are not pregnant. 0-2 drinks a day for men. Know how much alcohol is in your drink. In the [place] one drink equals one 12 oz bottle of beer (398m), one 5 oz glass of wine (1436m, or one 1 oz glass of hard liquor (4443m Do not use any products that contain nicotine or tobacco. These products include cigarettes, chewing tobacco, and vaping devices, such as e-cigarettes. If you need help quitting, ask your health care provider. Avoid secondhand smoke. Do not use drugs. Activity  Try to stay at a healthy weight. Get at least 30 minutes of exercise on most days, such as: Fast walking. Biking. Swimming. Medicines Take over-the-counter and prescription medicines only as told by your health care provider. Aspirin or blood thinners (antiplatelets or  anticoagulants) may be recommended to reduce your risk of forming blood clots that can lead to stroke. Avoid taking birth control pills. Talk to your health care provider about the risks of taking birth control pills if: You are over 35 21ars old. You smoke. You get very bad headaches. You have had a blood clot. Where to find more information American Stroke Association: www.strokeassociation.org Get help right away if: You or a loved one has any symptoms of a stroke. "BE FAST" is an easy way to remember the main warning signs of a stroke: B - Balance. Signs are dizziness, sudden trouble walking, or loss of balance. E - Eyes. Signs are trouble seeing or a sudden change in vision. F - Face. Signs are sudden weakness or numbness of the face, or the face or eyelid drooping on one side. A - Arms. Signs are weakness or numbness in an arm. This happens suddenly and usually on one side of the body. S - Speech. Signs are sudden trouble speaking, slurred speech, or trouble understanding what people say. T - Time. Time to call emergency services. Write down what time symptoms started. You or a loved one has other signs of a stroke, such as: A sudden, severe headache with no known cause. Nausea or vomiting. Seizure. These symptoms may represent a serious problem that is an emergency. Do not wait to see if the symptoms will go away. Get medical help right away. Call your local emergency services (911 in the [place] Do not drive yourself to the hospital. Summary You can help to prevent a stroke by  eating healthy, exercising, not smoking, limiting alcohol intake, and managing any medical conditions you may have. Do not use any products that contain nicotine or tobacco. These include cigarettes, chewing tobacco, and vaping devices, such as e-cigarettes. If you need help quitting, ask your health care provider. Remember "BE FAST" for warning signs of a stroke. Get help right away if you or a loved one has any  of these signs. This information is not intended to replace advice given to you by your health care provider. Make sure you discuss any questions you have with your health care provider. Document Revised: 02/22/2020 Document Reviewed: 02/22/2020 Elsevier Patient Education  [place].

## 2022-07-04 ENCOUNTER — Other Ambulatory Visit: Payer: Self-pay

## 2022-07-04 ENCOUNTER — Other Ambulatory Visit (HOSPITAL_COMMUNITY): Payer: Self-pay

## 2022-07-04 ENCOUNTER — Ambulatory Visit (INDEPENDENT_AMBULATORY_CARE_PROVIDER_SITE_OTHER): Payer: Commercial Managed Care - HMO | Admitting: Internal Medicine

## 2022-07-04 ENCOUNTER — Encounter: Payer: Self-pay | Admitting: Internal Medicine

## 2022-07-04 ENCOUNTER — Ambulatory Visit: Payer: Medicaid Other | Admitting: Cardiology

## 2022-07-04 VITALS — BP 138/74 | HR 66 | Temp 97.7°F | Ht 62.0 in | Wt 225.0 lb

## 2022-07-04 DIAGNOSIS — G43719 Chronic migraine without aura, intractable, without status migrainosus: Secondary | ICD-10-CM

## 2022-07-04 DIAGNOSIS — Z0000 Encounter for general adult medical examination without abnormal findings: Secondary | ICD-10-CM | POA: Insufficient documentation

## 2022-07-04 DIAGNOSIS — H6690 Otitis media, unspecified, unspecified ear: Secondary | ICD-10-CM | POA: Insufficient documentation

## 2022-07-04 DIAGNOSIS — I10 Essential (primary) hypertension: Secondary | ICD-10-CM | POA: Diagnosis not present

## 2022-07-04 DIAGNOSIS — H65191 Other acute nonsuppurative otitis media, right ear: Secondary | ICD-10-CM

## 2022-07-04 DIAGNOSIS — I639 Cerebral infarction, unspecified: Secondary | ICD-10-CM | POA: Diagnosis not present

## 2022-07-04 DIAGNOSIS — E1165 Type 2 diabetes mellitus with hyperglycemia: Secondary | ICD-10-CM | POA: Diagnosis not present

## 2022-07-04 DIAGNOSIS — G43909 Migraine, unspecified, not intractable, without status migrainosus: Secondary | ICD-10-CM | POA: Insufficient documentation

## 2022-07-04 MED ORDER — NURTEC 75 MG PO TBDP
75.0000 mg | ORAL_TABLET | Freq: Every day | ORAL | 1 refills | Status: DC | PRN
  Filled 2022-07-04: qty 30, 30d supply, fill #0
  Filled 2022-07-10 (×2): qty 8, 30d supply, fill #0
  Filled 2022-07-11: qty 30, 30d supply, fill #0
  Filled 2022-07-11: qty 8, 30d supply, fill #0
  Filled 2022-07-11: qty 30, 90d supply, fill #0
  Filled 2022-07-16: qty 30, 30d supply, fill #0
  Filled 2022-07-17: qty 30, 60d supply, fill #0
  Filled 2022-07-23: qty 16, 30d supply, fill #0

## 2022-07-04 MED ORDER — LISINOPRIL 20 MG PO TABS
20.0000 mg | ORAL_TABLET | Freq: Every day | ORAL | 2 refills | Status: DC
  Filled 2022-07-04: qty 30, 30d supply, fill #0
  Filled 2022-08-01: qty 30, 30d supply, fill #1

## 2022-07-04 MED ORDER — SEMAGLUTIDE(0.25 OR 0.5MG/DOS) 2 MG/3ML ~~LOC~~ SOPN
0.2500 mg | PEN_INJECTOR | SUBCUTANEOUS | 0 refills | Status: DC
  Filled 2022-07-04: qty 3, 42d supply, fill #0
  Filled 2022-07-10 – 2022-07-23 (×6): qty 3, 28d supply, fill #0

## 2022-07-04 MED ORDER — CEFDINIR 300 MG PO CAPS
300.0000 mg | ORAL_CAPSULE | Freq: Two times a day (BID) | ORAL | 0 refills | Status: AC
  Filled 2022-07-04: qty 10, 5d supply, fill #0

## 2022-07-04 MED ORDER — ATORVASTATIN CALCIUM 80 MG PO TABS
80.0000 mg | ORAL_TABLET | Freq: Every day | ORAL | 3 refills | Status: DC
  Filled 2022-07-04: qty 30, 30d supply, fill #0
  Filled 2022-08-01: qty 30, 30d supply, fill #1
  Filled 2022-08-29: qty 30, 30d supply, fill #2
  Filled 2022-09-27 – 2022-10-01 (×2): qty 30, 30d supply, fill #3

## 2022-07-04 NOTE — Assessment & Plan Note (Addendum)
A1c checked 2 months ago while the patient was hospitalized for an acute cerebral infarction and was 8.4%.  She was started on glipizide 5 mg daily and has been taking this since then.  She denies any vision changes, dizziness, diaphoresis, polyuria, or polydipsia.   Plan: -Discontinue glipizide -Start Ozempic 0.25 mg/week (if Ozempic is not covered by [employer], will send in Cl[place]) -Urine micro today -Follow-up in 4 weeks for repeat A1c -Referral to [facility] (diabetes and nutrition) -Referral to ophthalmology

## 2022-07-04 NOTE — Patient Instructions (Signed)
Thank you, [name] for allowing [place] to provide your care today. Today we discussed:  High blood pressure: keep taking lisinopril/zesteril once a day! Your repeat blood pressure was better - if it is high at your next visit, we may start a new medication   Diabetes: STOP taking glipizide and start ozempic (once a week injection). If this medicine is not covered by your insurance I will send in Victoza so please let me know I have referred you to an eye doctor We are checking a urine test to see your protein level in your urine (sign of kidney disease in diabetes)  Migraines: I have sent in a prescription for nurtec. You can take this once a day, as needed, for migraines. DO NOT take ibuprofen/tylenol with this - if you take these medicines too often you can get what we call "medication overuse headaches"  Ear pain: I have sent in an antitbioc- take cefdinir 300 mg twice a day for 5 days  Stroke: Continue to take your atorvastatin and aspirin daily  I have ordered the following labs for you:   Lab Orders         Microalbumin / Creatinine Urine Ratio         BMP8+Anion Gap       Referrals ordered today:    Referral Orders         Ambulatory referral to Ophthalmology         Referral to Nutrition and Diabetes Services      I have ordered the following medication/changed the following medications:   Stop the following medications: Medications Discontinued During This Encounter  Medication Reason   TYLENOL 500 MG tablet    glipiZIDE (GLUCOTROL) 5 MG tablet    atorvastatin (LIPITOR) 80 MG tablet Reorder   lisinopril (ZESTRIL) 20 MG tablet Reorder     Start the following medications: Meds ordered this encounter  Medications   Semaglutide,0.25 or 0.'5MG'$ /DOS, 2 MG/3ML SOPN    Sig: Inject 0.25 mg into the skin once a week.    Dispense:  3 mL    Refill:  0   atorvastatin (LIPITOR) 80 MG tablet    Sig: Take 1 tablet (80 mg total) by mouth daily.    Dispense:  30  tablet    Refill:  3   Rimegepant Sulfate (NURTEC) 75 MG TBDP    Sig: Take 75 mg by mouth daily as needed.    Dispense:  30 tablet    Refill:  1   lisinopril (ZESTRIL) 20 MG tablet    Sig: Take 1 tablet (20 mg total) by mouth daily.    Dispense:  30 tablet    Refill:  2     Follow up:  1 month     Should you have any questions or concerns please call the internal medicine clinic at [phone].     Buddy Duty, D.O. [place]

## 2022-07-04 NOTE — Assessment & Plan Note (Signed)
The patient presents to the [facility] to establish care after she was recently hospitalized at the end of September.  At that time, she noted that she was having left-sided facial numbness and felt that her speech was "off". MRI was done and showed small acute infarcts in the right basal ganglia and right centrum semiovale. She was started on atorvastatin, aspirin, and Plavix (has since completed Plavix). Echo at that time was normal, aside from grade I diastolic dysfunction.  The patient has followed up with neurology and will be seeing them every 6 months.  Plan: -Continue atorvastatin 80 mg daily and aspirin 81 mg daily -Continue to follow-up with neurology as recommended

## 2022-07-04 NOTE — Assessment & Plan Note (Signed)
BP Readings from Last 3 Encounters:  07/04/22 138/74  06/20/22 (!) 170/85  06/01/22 (!) 383/77   Initial systolic blood pressure elevated to the 150s, improved to 138/74 upon recheck.  Patient is on lisinopril 20 mg daily for her hypertension and has been adherent with taking this daily.  She denies any headaches, vision changes, dizziness, lightheadedness, chest pain, or dyspnea.  Plan: -Continue lisinopril 20 mg daily -Could consider addition of amlodipine or chlorthalidone at next office visit if blood pressure still elevated (would add additional antihypertensive as opposed to increasing dose of lisinopril) -BMP today

## 2022-07-04 NOTE — Assessment & Plan Note (Addendum)
The patient states that she has a history of chronic migraines.  They affect her almost daily and cause the patient significant distress in her day-to-day activities.  She denies any auras. She previously was prescribed sumatriptan which did not help her migraines, and later tried taking rizatriptan, which also did not help to treat her migraines.  Additionally, with the patient's new diagnosis of a cerebral infarction, would not start any other triptans.  She has been taking a combination of ibuprofen/Tylenol and/or Excedrin to treat her migraines -she notes that these medications help to dull her migraines, but do not completely relieve her of them.  We discussed the risks of taking these medications regularly, as she would be at risk for medication overuse headaches.  Plan: -Start Nurtec 75 mg daily prn for migraine abortion

## 2022-07-04 NOTE — Assessment & Plan Note (Signed)
The patient states that for the past week she has had right-sided ear pain and decreased/muffled hearing.  Her pain acutely worsened yesterday to the point that it was difficult for her to lie down on that side.  She denies any fevers, illness, or any other upper respiratory tract symptoms.  Of note, she has a history recurrent otitis media and possibly tympanostomy tubes as a child.  On exam, the patient's right ear canal was noted to be erythematous and bulging, but no fluid collection was appreciated.  Plan: -Given the patient's penicillin allergy will send in cefdinir 300 mg twice daily x5 days

## 2022-07-04 NOTE — Assessment & Plan Note (Addendum)
-  Declined flu and COVID vaccines today -Had a complete hysterectomy in 2010 to diagnosis of ovarian cancer

## 2022-07-04 NOTE — Progress Notes (Signed)
Internal Medicine Clinic Attending ° °Case discussed with Dr. Atway  At the time of the visit.  We reviewed the resident’s history and exam and pertinent patient test results.  I agree with the assessment, diagnosis, and plan of care documented in the resident’s note.  °

## 2022-07-04 NOTE — Progress Notes (Signed)
 CC: establish care  HPI:  [name] is a 41 y.o. female living with a history stated below and presents today to establish care with the [facility]. Please see problem based assessment and plan for additional details.  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 Alcohol: 2 shots/day. Marijuana occassionally. 6 daughters ages 18-30, has 6 grandchildren (3-14). Moved from [place], been in [place] since 2013.   Past Medical History:  Diagnosis Date   Cancer (HCC)    ovarian 2001 and 2010   Essential hypertension 05/04/2022   Hypertension    Migraine    Pityriasis rosea    Uncontrolled type 2 diabetes mellitus with hyperglycemia (HCC) 05/04/2022    Current Outpatient Medications on File Prior to Visit  Medication Sig Dispense Refill   Accu-Chek Softclix Lancets lancets Use to test blood sugar 4 (four) times daily. 100 each 0   aspirin EC 81 MG tablet Take 1 tablet (81 mg total) by mouth daily. Swallow whole. 30 tablet 3   blood glucose meter kit and supplies KIT Dispense based on patient and insurance preference. Use up to four times daily as directed. 1 each 0   Blood Glucose Monitoring Suppl (ACCU-CHEK GUIDE) w/Device KIT Use to test up to 4 (four) times daily as directed 1 kit 0   glucose blood test strip Use to check blood sugar 4 (four) times daily. 50 each 0   No current facility-administered medications on file prior to visit.    Family History  Problem Relation Age of Onset   Hyperlipidemia Mother    Hypertension Mother    Diabetes Mother    Stroke Mother    Cerebral aneurysm Mother    Cerebral aneurysm Father    Cancer Father    Stroke Father    Breast cancer Maternal  Aunt 65    Social History   Socioeconomic History   Marital status: Single    Spouse name: Not on file   Number of children: Not on file   Years of education: Not on file   Highest education level: Not on file  Occupational History   Not on file  Tobacco Use   Smoking status: Every Day    Packs/day: 0.25    Types: Cigarettes   Smokeless tobacco: Never  Substance and Sexual Activity   Alcohol use: Yes    Alcohol/week: 14.0 standard drinks of alcohol    Types: 14 Shots of liquor per week   Drug use: No   Sexual activity: Not on file  Other Topics Concern   Not on file  Social History Narrative   Not on file   Social Determinants of Health   Financial Resource Strain: Not on file  Food Insecurity: Not on file  Transportation Needs: Not on file  Physical Activity: Not on file  Stress: Not on file  Social Connections: Not on file  Intimate Partner Violence: Not on file    Review of Systems: ROS negative except for what is noted on the assessment and plan.  Vitals:   07/04/22 1023 07/04/22 1106  BP: (!) 153/88 138/74  Pulse: 74 66  Temp: 97.7 F (36.5 C)   TempSrc: Oral     SpO2: 97%   Weight: 225 lb (102.1 kg)   Height: 5' 2" (1.575 m)     Physical Exam: Constitutional: well-appearing female sitting in chair, in no acute distress HENT: normocephalic atraumatic, mucous membranes moist, R ear canal erythematous, bulging, no fluid collection noted.  Cardiovascular: regular rate and rhythm, no m/r/g Pulmonary/Chest: normal work of breathing on room air, lungs clear to auscultation bilaterally Abdominal: soft, non-tender, non-distended, normal bowel sounds MSK: normal bulk and tone Neurological: alert & oriented x 3, no focal deficit Skin: warm and dry Psych: normal mood and behavior  Assessment & Plan:    Patient discussed with Dr. Lau  Essential hypertension BP Readings from Last 3 Encounters:  07/04/22 138/74  06/20/22 (!) 170/85  06/01/22 (!) 174/98    Initial systolic blood pressure elevated to the 150s, improved to 138/74 upon recheck.  Patient is on lisinopril 20 mg daily for her hypertension and has been adherent with taking this daily.  She denies any headaches, vision changes, dizziness, lightheadedness, chest pain, or dyspnea.  Plan: -Continue lisinopril 20 mg daily -Could consider addition of amlodipine or chlorthalidone at next office visit if blood pressure still elevated (would add additional antihypertensive as opposed to increasing dose of lisinopril) -BMP today  Uncontrolled type 2 diabetes mellitus with hyperglycemia (HCC) A1c checked 2 months ago while the patient was hospitalized for an acute cerebral infarction and was 8.4%.  She was started on glipizide 5 mg daily and has been taking this since then.  She denies any vision changes, dizziness, diaphoresis, polyuria, or polydipsia.   Plan: -Discontinue glipizide -Start Ozempic 0.25 mg/week (if Ozempic is not covered by patient's insurance, will send in Victoza) -Urine micro today -Follow-up in 4 weeks for repeat A1c -Referral to [facility] (diabetes and nutrition) -Referral to ophthalmology   CVA (cerebral vascular accident) (HCC) The patient presents to the [facility]MC to establish care after she was recently hospitalized at the end of September.  At that time, she noted that she was having left-sided facial numbness and felt that her speech was "off". MRI was done and showed small acute infarcts in the right basal ganglia and right centrum semiovale. She was started on atorvastatin, aspirin, and Plavix (has since completed Plavix). Echo at that time was normal, aside from grade I diastolic dysfunction.  The patient has followed up with neurology and will be seeing them every 6 months.  Plan: -Continue atorvastatin 80 mg daily and aspirin 81 mg daily -Continue to follow-up with neurology as recommended  Healthcare maintenance -Declined flu and COVID vaccines today -Had a  complete hysterectomy in 2010 to diagnosis of ovarian cancer  Migraine The patient states that she has a history of chronic migraines.  They affect her almost daily and cause the patient significant distress in her day-to-day activities.  She denies any auras. She previously was prescribed sumatriptan which did not help her migraines, and later tried taking rizatriptan, which also did not help to treat her migraines.  Additionally, with the patient's new diagnosis of a cerebral infarction, would not start any other triptans.  She has been taking a combination of ibuprofen/Tylenol and/or Excedrin to treat her migraines -she notes that these medications help to dull her migraines, but do not completely relieve her of them.  We discussed the risks of taking these medications regularly, as she would be at risk for medication overuse headaches.  Plan: -Start Nurtec 75 mg daily prn for migraine abortion  Acute otitis media The patient states that for   the past week she has had right-sided ear pain and decreased/muffled hearing.  Her pain acutely worsened yesterday to the point that it was difficult for her to lie down on that side.  She denies any fevers, illness, or any other upper respiratory tract symptoms.  Of note, she has a history recurrent otitis media and possibly tympanostomy tubes as a child.  On exam, the patient's right ear canal was noted to be erythematous and bulging, but no fluid collection was appreciated.  Plan: -Given the patient's penicillin allergy will send in cefdinir 300 mg twice daily x5 days  Rayann Atway, D.O.  Internal Medicine, PGY-2 Phone: [phone] Date 07/04/2022 Time 11:43 AM  

## 2022-07-05 ENCOUNTER — Other Ambulatory Visit (HOSPITAL_COMMUNITY): Payer: Self-pay

## 2022-07-05 LAB — BMP8+ANION GAP
Anion Gap: 15 mmol/L (ref 10.0–18.0)
BUN/Creatinine Ratio: 19 (ref 9–23)
BUN: 11 mg/dL (ref 6–24)
CO2: 22 mmol/L (ref 20–29)
Calcium: 9.1 mg/dL (ref 8.7–10.2)
Chloride: 101 mmol/L (ref 96–106)
Creatinine, Ser: 0.57 mg/dL (ref 0.57–1.00)
Glucose: 88 mg/dL (ref 70–99)
Potassium: 4.1 mmol/L (ref 3.5–5.2)
Sodium: 138 mmol/L (ref 134–144)
eGFR: 116 mL/min/{1.73_m2} (ref >59)

## 2022-07-05 LAB — MICROALBUMIN / CREATININE URINE RATIO
Creatinine, Urine: 155.5 mg/dL
Microalb/Creat Ratio: 5 mg/g creat (ref 0–29)
Microalbumin, Urine: 7.1 ug/mL

## 2022-07-05 NOTE — Progress Notes (Signed)
BMP and urine microalbumin/creatinine within normal limits. Patient called and made aware.

## 2022-07-06 ENCOUNTER — Other Ambulatory Visit (HOSPITAL_COMMUNITY): Payer: Self-pay

## 2022-07-10 ENCOUNTER — Telehealth: Payer: Self-pay

## 2022-07-10 ENCOUNTER — Other Ambulatory Visit (HOSPITAL_COMMUNITY): Payer: Self-pay

## 2022-07-10 NOTE — Telephone Encounter (Signed)
Prior Authorization for patient (ozempic) came through on cover my meds was submitted with last office notes awaiting approval or denial 

## 2022-07-10 NOTE — Telephone Encounter (Signed)
Prior Authorization for patient (Nurtec) came through on cover my meds was submitted with last office notes awaiting approval or denial

## 2022-07-11 ENCOUNTER — Other Ambulatory Visit (HOSPITAL_COMMUNITY): Payer: Self-pay

## 2022-07-11 NOTE — Telephone Encounter (Signed)
Decision:Denied Regina Cantu (Key: [id number]) Rx #: [id number] Ozempic (0.25 or 0.5 MG/DOSE) '2MG'$ [name] pen-injectors   Form [employer] Electronic PA Form (2017 NCPDP) Message from Plan CaseId:[id number];Status:Denied;Review Type:;Appeal Information: [place] [id number],YTRZNBVAPOL,ID,[id number]; Important - Please read the below note on eAppeals: Please reference the denial letter for information on the rights for an appeal, rationale for the denial, and how to submit an appeal including if any information is needed to support the appeal. Note about urgent situations - Generally, an urgent situation is one which, in the opinion of the provider, the health of the patient may be in serious jeopardy or may experience pain that cannot be adequately controlled while waiting for a decision on the appeal.;

## 2022-07-11 NOTE — Telephone Encounter (Signed)
Decision:Approved Janace Aris Key: [id number] - PA Case ID: [id number] - Rx #: [id number] Need help? Call us at [phone] Outcome Approvedon December 5 CaseId:[id number];Status:Approved;Review Type:Prior Auth;Coverage Start Date:07/10/2022;Coverage End Date:07/11/2023; Drug Nurtec '75MG'$  dispersible tablets Form [employer] Electronic PA Form [phone] [place]

## 2022-07-16 ENCOUNTER — Other Ambulatory Visit (HOSPITAL_COMMUNITY): Payer: Self-pay

## 2022-07-16 NOTE — Progress Notes (Signed)
Electrophysiology Office Note:    Date:  07/17/2022   ID:  Regina Cantu, [place] 16-Feb-1981, MRN [id number]  PCP:  Chauncey Mann, DO   [facility] Providers Cardiologist:  None Electrophysiologist:  Maurice Small, MD     Referring MD: Osvaldo Shipper, MD   History of Present Illness:    Regina Cantu is a 41 y.o. female with a hx listed below, significant for hypertension and stroke, referred for loop recorder placement to exclude atrial fibrillation.  She presented to the ER in September, 2023 with dysarthria and facial numbness. She was readmitted in October, 2023 for stroke-like symptoms, but her syndrome did not correlate with a discreet neurologic territory consistent with stroke. She wore a monitor; it did not show any arrhythmia.  She does not have palpitations, chest pain.  Past Medical History:  Diagnosis Date   Cancer (HCC)    ovarian 2001 and 2010   Essential hypertension 05/04/2022   Hypertension    Migraine    Pityriasis rosea    Uncontrolled type 2 diabetes mellitus with hyperglycemia (HCC) 05/04/2022    Past Surgical History:  Procedure Laterality Date   ABDOMINAL HYSTERECTOMY     OVARIAN CANCER      Current Medications: Current Meds  Medication Sig   Accu-Chek Softclix Lancets lancets Use to test blood sugar 4 (four) times daily.   aspirin EC 81 MG tablet Take 1 tablet (81 mg total) by mouth daily. Swallow whole.   atorvastatin (LIPITOR) 80 MG tablet Take 1 tablet (80 mg total) by mouth daily.   Blood Glucose Monitoring Suppl (ACCU-CHEK GUIDE) w/Device KIT Use to test up to 4 (four) times daily as directed   lisinopril (ZESTRIL) 20 MG tablet Take 1 tablet (20 mg total) by mouth daily.   Rimegepant Sulfate (NURTEC) 75 MG TBDP Take 1 tablet (75mg ) by mouth daily as needed.   Semaglutide,0.25 or 0.5MG /DOS, 2 MG/3ML SOPN Inject 0.25 mg into the skin once a week.     Allergies:   Bee venom, Mushroom extract complex, Wasp venom, Latex,  and Penicillins   Social History   Socioeconomic History   Marital status: Single    Spouse name: Not on file   Number of children: Not on file   Years of education: Not on file   Highest education level: Not on file  Occupational History   Not on file  Tobacco Use   Smoking status: Not on file   Smokeless tobacco: Never  Substance and Sexual Activity   Alcohol use: Yes    Alcohol/week: 14.0 standard drinks of alcohol    Types: 14 Shots of liquor per week   Drug use: No   Sexual activity: Not on file  Other Topics Concern   Not on file  Social History Narrative   Not on file   Social Determinants of Health   Financial Resource Strain: Not on file  Food Insecurity: Not on file  Transportation Needs: Not on file  Physical Activity: Not on file  Stress: Not on file  Social Connections: Not on file     Family History: The patient's family history includes Breast cancer (age of onset: 60) in her maternal aunt; Cancer in her father; Cerebral aneurysm in her father and mother; Diabetes in her mother; Hyperlipidemia in her mother; Hypertension in her mother; Stroke in her father and mother.  ROS:   Please see the history of present illness.    All other systems reviewed and are negative.  EKGs/Labs/Other Studies Reviewed Today:     TTE and monitor results reviewed under the CV procedures tab  EKG:  Last EKG results: interpreted by me today - NSR    Recent Labs: 01/13/2022: Magnesium 2.1; TSH 0.588 06/01/2022: ALT 39; Hemoglobin 13.9; Platelets 362 07/04/2022: BUN 11; Creatinine, Ser 0.57; Potassium 4.1; Sodium 138     Physical Exam:    VS:  BP 130/86   Pulse 77   Ht 5\' 2"  (1.575 m)   Wt 222 lb 12.8 oz (101.1 kg)   SpO2 98%   BMI 40.75 kg/m     Wt Readings from Last 3 Encounters:  07/17/22 222 lb 12.8 oz (101.1 kg)  07/04/22 225 lb (102.1 kg)  06/20/22 221 lb (100.2 kg)     GEN: Well nourished, well developed in no acute distress CARDIAC: RRR, no  murmurs, rubs, gallops RESPIRATORY:  Normal work of breathing MUSCULOSKELETAL: no edema    ASSESSMENT & PLAN:    Lacunar stroke, small vessel stroke: unlikely to be due to embolic disease from AF. Our loop recorder protocol excludes lacunar strokes as a criteria for loop recorder placement. It appears that her referral to cardiology was for a 30-day monitor, which has already been placed and reviewed. I will be happy to readdress ILR placement if concern arises that her pattern of stroke is consistent with an embolic etiology.        Medication Adjustments/Labs and Tests Ordered: Current medicines are reviewed at length with the patient today.  Concerns regarding medicines are outlined above.  Orders Placed This Encounter  Procedures   EKG 12-Lead   No orders of the defined types were placed in this encounter.    Signed, Maurice Small, MD  07/17/2022 8:43 AM    [facility]

## 2022-07-17 ENCOUNTER — Ambulatory Visit: Payer: Commercial Managed Care - HMO | Attending: Cardiology | Admitting: Cardiovascular Disease

## 2022-07-17 ENCOUNTER — Other Ambulatory Visit (HOSPITAL_COMMUNITY): Payer: Self-pay

## 2022-07-17 ENCOUNTER — Encounter: Payer: Self-pay | Admitting: Cardiovascular Disease

## 2022-07-17 VITALS — BP 130/86 | HR 77 | Ht 62.0 in | Wt 222.8 lb

## 2022-07-17 DIAGNOSIS — I639 Cerebral infarction, unspecified: Secondary | ICD-10-CM

## 2022-07-17 NOTE — Patient Instructions (Signed)
Medication Instructions:  Your physician recommends that you continue on your current medications as directed. Please refer to the Current Medication list given to you today.  *If you need a refill on your cardiac medications before your next appointment, please call your pharmacy*  Follow-Up: At [facility], you and your health needs are our priority.  As part of our continuing mission to provide you with exceptional heart care, we have created designated Provider Care Teams.  These Care Teams include your primary Cardiologist (physician) and Advanced Practice Providers (APPs -  Physician Assistants and Nurse Practitioners) who all work together to provide you with the care you need, when you need it.  Follow up as needed.   Important Information About Sugar

## 2022-07-23 ENCOUNTER — Other Ambulatory Visit (HOSPITAL_COMMUNITY): Payer: Self-pay

## 2022-07-23 MED ORDER — DULAGLUTIDE 0.75 MG/0.5ML ~~LOC~~ SOAJ
0.7500 mg | SUBCUTANEOUS | 1 refills | Status: DC
  Filled 2022-07-23: qty 2, 28d supply, fill #0
  Filled 2022-08-14: qty 2, 28d supply, fill #1

## 2022-07-23 MED ORDER — NURTEC 75 MG PO TBDP
37.5000 mg | ORAL_TABLET | Freq: Every day | ORAL | 0 refills | Status: DC | PRN
  Filled 2022-07-23: qty 8, 30d supply, fill #0
  Filled 2022-08-29: qty 8, 30d supply, fill #1
  Filled 2022-10-09: qty 8, 30d supply, fill #2

## 2022-07-23 NOTE — Addendum Note (Signed)
Addended by: Buddy Duty on: 07/23/2022 12:30 PM   Modules accepted: Orders

## 2022-08-01 ENCOUNTER — Other Ambulatory Visit (HOSPITAL_BASED_OUTPATIENT_CLINIC_OR_DEPARTMENT_OTHER): Payer: Self-pay

## 2022-08-01 ENCOUNTER — Other Ambulatory Visit (HOSPITAL_COMMUNITY): Payer: Self-pay

## 2022-08-01 ENCOUNTER — Other Ambulatory Visit: Payer: Self-pay

## 2022-08-01 MED ORDER — PREDNISONE 5 MG PO TABS
ORAL_TABLET | ORAL | 0 refills | Status: DC
  Filled 2022-08-01 (×2): qty 42, 12d supply, fill #0

## 2022-08-06 DIAGNOSIS — I639 Cerebral infarction, unspecified: Secondary | ICD-10-CM

## 2022-08-06 HISTORY — DX: Cerebral infarction, unspecified: I63.9

## 2022-08-14 ENCOUNTER — Other Ambulatory Visit: Payer: Self-pay | Admitting: Internal Medicine

## 2022-08-14 ENCOUNTER — Other Ambulatory Visit (HOSPITAL_COMMUNITY): Payer: Self-pay

## 2022-08-14 NOTE — Telephone Encounter (Signed)
Called pt - stated she does not need refill on Prednisone.

## 2022-08-16 ENCOUNTER — Other Ambulatory Visit (HOSPITAL_COMMUNITY): Payer: Self-pay

## 2022-08-16 ENCOUNTER — Ambulatory Visit: Payer: Managed Care, Other (non HMO) | Admitting: Student

## 2022-08-16 VITALS — BP 162/105 | HR 101 | Temp 98.1°F | Ht 62.0 in | Wt 217.4 lb

## 2022-08-16 DIAGNOSIS — G43719 Chronic migraine without aura, intractable, without status migrainosus: Secondary | ICD-10-CM

## 2022-08-16 DIAGNOSIS — E1165 Type 2 diabetes mellitus with hyperglycemia: Secondary | ICD-10-CM

## 2022-08-16 DIAGNOSIS — Z794 Long term (current) use of insulin: Secondary | ICD-10-CM | POA: Diagnosis not present

## 2022-08-16 DIAGNOSIS — F17210 Nicotine dependence, cigarettes, uncomplicated: Secondary | ICD-10-CM | POA: Diagnosis not present

## 2022-08-16 DIAGNOSIS — Z720 Tobacco use: Secondary | ICD-10-CM

## 2022-08-16 DIAGNOSIS — I639 Cerebral infarction, unspecified: Secondary | ICD-10-CM

## 2022-08-16 DIAGNOSIS — I10 Essential (primary) hypertension: Secondary | ICD-10-CM | POA: Diagnosis not present

## 2022-08-16 DIAGNOSIS — J069 Acute upper respiratory infection, unspecified: Secondary | ICD-10-CM | POA: Diagnosis not present

## 2022-08-16 DIAGNOSIS — Z8543 Personal history of malignant neoplasm of ovary: Secondary | ICD-10-CM

## 2022-08-16 DIAGNOSIS — F32A Depression, unspecified: Secondary | ICD-10-CM

## 2022-08-16 DIAGNOSIS — Z8673 Personal history of transient ischemic attack (TIA), and cerebral infarction without residual deficits: Secondary | ICD-10-CM

## 2022-08-16 LAB — POCT GLYCOSYLATED HEMOGLOBIN (HGB A1C): Hemoglobin A1C: 6.5 % — AB (ref 4.0–5.6)

## 2022-08-16 LAB — GLUCOSE, CAPILLARY: Glucose-Capillary: 136 mg/dL — ABNORMAL HIGH (ref 70–99)

## 2022-08-16 MED ORDER — TOPIRAMATE 25 MG PO TABS
25.0000 mg | ORAL_TABLET | Freq: Every day | ORAL | 2 refills | Status: DC
  Filled 2022-08-16: qty 30, 30d supply, fill #0
  Filled 2022-09-11: qty 30, 30d supply, fill #1
  Filled 2022-10-09: qty 30, 30d supply, fill #2

## 2022-08-16 MED ORDER — VENLAFAXINE HCL ER 37.5 MG PO CP24
37.5000 mg | ORAL_CAPSULE | Freq: Every day | ORAL | 1 refills | Status: DC
  Filled 2022-08-16: qty 30, 30d supply, fill #0
  Filled 2022-09-11: qty 30, 30d supply, fill #1

## 2022-08-16 MED ORDER — LOSARTAN POTASSIUM-HCTZ 50-12.5 MG PO TABS
1.0000 | ORAL_TABLET | Freq: Every day | ORAL | 1 refills | Status: DC
  Filled 2022-08-16: qty 30, 30d supply, fill #0
  Filled 2022-09-11: qty 30, 30d supply, fill #1

## 2022-08-16 MED ORDER — EMPAGLIFLOZIN 10 MG PO TABS
10.0000 mg | ORAL_TABLET | Freq: Every day | ORAL | 1 refills | Status: DC
  Filled 2022-08-16: qty 30, 30d supply, fill #0

## 2022-08-16 NOTE — Patient Instructions (Signed)
Thank you, [name] for allowing [place] to provide your care today. Today we discussed .  High blood pressure We will be switching you to losartan-hctz combination pill. Please stop taking lisinopril. If you notice yourself feeling lightheaded or dizzy please check your blood pressure. Return in one month for a blood pressure check  Diabetes Please continue trulicity and we will add jardiance today. Please follow up in 3 months for a repeat A1c  History of stroke Continue your aspirin and atorvastatin daily  Migraine We will add topamax to take daily, continue nyrtec every other day  Upper repsiratory  Please take flonase for your nasal congestion and use tea and honey without caffeine for your cough. If you start noticing signs/symptoms such as fevers,chills,body aches, please call the clinic.   Depression We will be starting venlafaxine, this will also help with hot flashes from menopause. Please be aware of a phone call from the clinic to schedule your follow up with counseling.   Smoking Please let us know if you need help stopping smoking    I have ordered the following labs for you:   Lab Orders         POC Hbg A1C       Referrals ordered today:    Referral Orders         Ambulatory referral to [place]      I have ordered the following medication/changed the following medications:   Stop the following medications: Medications Discontinued During This Encounter  Medication Reason   lisinopril (ZESTRIL) 20 MG tablet      Start the following medications: Meds ordered this encounter  Medications   venlafaxine XR (EFFEXOR-XR) 37.5 MG 24 hr capsule    Sig: Take 1 capsule (37.5 mg total) by mouth daily with breakfast.    Dispense:  30 capsule    Refill:  1   losartan-hydrochlorothiazide (HYZAAR) 50-12.5 MG tablet    Sig: Take 1 tablet by mouth daily.    Dispense:  30 tablet    Refill:  1   empagliflozin (JARDIANCE) 10 MG TABS  tablet    Sig: Take 1 tablet (10 mg total) by mouth daily before breakfast.    Dispense:  30 tablet    Refill:  1   topiramate (TOPAMAX) 25 MG tablet    Sig: Take 1 tablet (25 mg total) by mouth at bedtime.    Dispense:  30 tablet    Refill:  2     Follow up:  1 month  for diabetes and high blood pressure. Come back sooner if need be   Should you have any questions or concerns please call the internal medicine clinic at [phone].    Sanjuana Letters, D.O. [place]

## 2022-08-17 ENCOUNTER — Encounter: Payer: Self-pay | Admitting: Student

## 2022-08-17 ENCOUNTER — Other Ambulatory Visit (HOSPITAL_COMMUNITY): Payer: Self-pay

## 2022-08-17 DIAGNOSIS — Z720 Tobacco use: Secondary | ICD-10-CM | POA: Insufficient documentation

## 2022-08-17 DIAGNOSIS — J069 Acute upper respiratory infection, unspecified: Secondary | ICD-10-CM

## 2022-08-17 DIAGNOSIS — F32A Depression, unspecified: Secondary | ICD-10-CM | POA: Insufficient documentation

## 2022-08-17 DIAGNOSIS — F321 Major depressive disorder, single episode, moderate: Secondary | ICD-10-CM | POA: Insufficient documentation

## 2022-08-17 HISTORY — DX: Acute upper respiratory infection, unspecified: J06.9

## 2022-08-17 NOTE — Assessment & Plan Note (Signed)
Assessment: PHQ-9 elevated today. Endorses does have episodes of anxiety and is tearful on exam. She is dealing with many stressors including her partner's health, her health, and her children. Will plan to start venlafaxine for depression and vasomotor post-menopausal symptoms. Will also refer to [facility]  Plan: - start venlafaxine 37.5 mg daily and referral placed to [facility]

## 2022-08-17 NOTE — Assessment & Plan Note (Signed)
Assessment: Encouraged cessation with history of CVA, she has quit multiple times in the past. Denies needing medications for assistance  Plan: - encourage smoking cessation

## 2022-08-17 NOTE — Assessment & Plan Note (Signed)
Assessment: Current regimen of trulicity .'75mg'$  weekly. A1c has improved from 8.4% to 6.5%. She does endorse taking her husbands jardiance for the past 2 week. Unclear how much this contributed but because she tolerated it well and appears to have improved A1c, will start jardiance. Follow up in one month to see how she is tolerating the medication. Ozempic not covered in the past.   Plan: - continue trulicity .'75mg'$  weekly and start jardiance 10 mg daily - follow up in one month, repeat A1c in 3 months

## 2022-08-17 NOTE — Assessment & Plan Note (Addendum)
Assessment: Presents today for a cough and upper nasal congestion for the past day days. She slept with the window open 2 days ago and woke up with the cough and congestion. Denies systemic symptoms of shortness of breath, fever, chills. Has had improvement in her cough with tea/honey. She feels as though her symptoms are already starting to improve. Lungs are clear to auscultation on exam. Encouraged her to reach out if her symptoms worsen or she develops fevers, chills etc.   Plan: - OTC flonase for congestion and continue decaffeinated tea.  - follow up resolution next month or sooner if symptoms worsen

## 2022-08-17 NOTE — Assessment & Plan Note (Signed)
Assessment: Denies residual deficits and has continued on atorvastatin and aspirin daily. Last lipid panel with LDL over 70. Will need repeat at next visit.  Plan: - continue aspirin 81 mg daily and atorvastatin 80 mg daily.  - repeat lipid panel at next visit

## 2022-08-17 NOTE — Assessment & Plan Note (Signed)
Assessment: BP in clinic 162/105 and 140/83. Current regimen of lisinopril 20 mg daily. With persistently elevated BP will transition from lisinopril to losartan-HCTZ combination pill. Reevaluate in one month for BMP and titration as needed. Encouraged her to check BP twice weekly, does not have BP cuff so recommended she visit local pharmacy and can check there if unable to get home cuff.   Plan: - DC lisinopril, start losartan HCTZ 50-12.5 mg daily - follow up one month for titration if needed and BMP

## 2022-08-17 NOTE — Progress Notes (Signed)
CC: Type 2 diabetes, HTN  HPI:  [name] is a 42 y.o. female living with a history stated below and presents today for follow up of her chronic medical conditions as well as acute concern for URI. Please see problem based assessment and plan for additional details.  Past Medical History:  Diagnosis Date   Cancer ([place])    ovarian 2001 and 2010   Essential hypertension 05/04/2022   Hypertension    Migraine    Pityriasis rosea    Uncontrolled type 2 diabetes mellitus with hyperglycemia ([place]) 05/04/2022    Current Outpatient Medications on File Prior to Visit  Medication Sig Dispense Refill   Accu-Chek Softclix Lancets lancets Use to test blood sugar 4 (four) times daily. 100 each 0   aspirin EC 81 MG tablet Take 1 tablet (81 mg total) by mouth daily. Swallow whole. 30 tablet 3   atorvastatin (LIPITOR) 80 MG tablet Take 1 tablet (80 mg total) by mouth daily. 30 tablet 3   Blood Glucose Monitoring Suppl (ACCU-CHEK GUIDE) w/Device KIT Use to test up to 4 (four) times daily as directed 1 kit 0   Dulaglutide 0.75 MG/0.5ML SOPN Inject 0.75 mg into the skin once a week. 2 mL 1   predniSONE (DELTASONE) 5 MG tablet Take 6 tabs daily for 2 days then 5 tabs daily x 2 days, then 4 tabs daily x 2 days, then 3 tabs daily x 2 days, then 2 tabs daily x 2 days then 1 tab daily x 2 days, then stop 42 tablet 0   Rimegepant Sulfate (NURTEC) 75 MG TBDP Take 1/2 tablet (37.5 mg total) by mouth daily as needed 60 tablet 0   No current facility-administered medications on file prior to visit.    Family History  Problem Relation Age of Onset   Hyperlipidemia Mother    Hypertension Mother    Diabetes Mother    Stroke Mother    Cerebral aneurysm Mother    Cerebral aneurysm Father    Cancer Father    Stroke Father    Breast cancer Maternal Aunt 27    Social History   Socioeconomic History   Marital status: Single    Spouse name: Not on file   Number of children: Not on file   Years  of education: Not on file   Highest education level: Not on file  Occupational History   Not on file  Tobacco Use   Smoking status: Not on file   Smokeless tobacco: Never  Substance and Sexual Activity   Alcohol use: Yes    Alcohol/week: 14.0 standard drinks of alcohol    Types: 14 Shots of liquor per week   Drug use: No   Sexual activity: Not on file  Other Topics Concern   Not on file  Social History Narrative   Not on file   Social Determinants of Health   Financial Resource Strain: Not on file  Food Insecurity: Not on file  Transportation Needs: Not on file  Physical Activity: Not on file  Stress: Not on file  Social Connections: Not on file  Intimate Partner Violence: Not on file    Review of Systems: ROS negative except for what is noted on the assessment and plan.  Vitals:   08/16/22 0920 08/16/22 0941  BP: (!) 140/83 (!) 162/105  Pulse: 99 (!) 101  Temp: 98.1 F (36.7 C)   TempSrc: Oral   SpO2: 99%   Weight: 217 lb 6.4 oz (98.6 kg)  Height: '5\' 2"'$  (1.575 m)     Physical Exam: Constitutional: no acute distress HENT: normocephalic atraumatic, mucous membranes moist. Nasal turbinates erythematous. No cobblestoning.  Eyes: conjunctiva non-erythematous Neck: supple Cardiovascular: regular rate and rhythm, no m/r/g Pulmonary/Chest: normal work of breathing on room air, lungs clear to auscultation bilaterally MSK: normal bulk and tone Neurological: alert & oriented x 3 Skin: warm and dry Psych: normal mood  Assessment & Plan:   Migraine Assessment: Continues to have breakthrough pain of her chronic migraines despite nurtec every other day. Will plan to add topomax to her regimen in hopes this helps with prevention. If not, would consider referral to migraine clinic.  Plan: - continue nurtec every other day, start topomax. - if no improvement refer to migraine clinic.  Essential hypertension Assessment: BP in clinic 162/105 and 140/83. Current  regimen of lisinopril 20 mg daily. With persistently elevated BP will transition from lisinopril to losartan-HCTZ combination pill. Reevaluate in one month for BMP and titration as needed. Encouraged her to check BP twice weekly, does not have BP cuff so recommended she visit local pharmacy and can check there if unable to get home cuff.   Plan: - DC lisinopril, start losartan HCTZ 50-12.5 mg daily - follow up one month for titration if needed and BMP  CVA (cerebral vascular accident) ([place]) Assessment: Denies residual deficits and has continued on atorvastatin and aspirin daily. Last lipid panel with LDL over 70. Will need repeat at next visit.  Plan: - continue aspirin 81 mg daily and atorvastatin 80 mg daily.  - repeat lipid panel at next visit  Uncontrolled type 2 diabetes mellitus with hyperglycemia ([facility]) Assessment: Current regimen of trulicity .'75mg'$  weekly. A1c has improved from 8.4% to 6.5%. She does endorse taking her husbands jardiance for the past 2 week. Unclear how much this contributed but because she tolerated it well and appears to have improved A1c, will start jardiance. Follow up in one month to see how she is tolerating the medication. Ozempic not covered in the past.   Plan: - continue trulicity .'75mg'$  weekly and start jardiance 10 mg daily - follow up in one month, repeat A1c in 3 months  Tobacco use Assessment: Encouraged cessation with history of CVA, she has quit multiple times in the past. Denies needing medications for assistance  Plan: - encourage smoking cessation  History of ovarian cancer Assessment: S/p bilateral oophorectomy and abdominal hysterectomy. After she noticed she had hot flashes day and night that have been affecting her daily life. With her depression, will start venlafaxine.   Plan: - venlafaxine 37.5 mg daily  Depression Assessment: PHQ-9 elevated today. Endorses does have episodes of anxiety and is tearful on exam. She is dealing with  many stressors including her partner's health, her health, and her children. Will plan to start venlafaxine for depression and vasomotor post-menopausal symptoms. Will also refer to IBH  Plan: - start venlafaxine 37.5 mg daily and referral placed to IBH  URI, acute Assessment: Presents today for a cough and upper nasal congestion for the past day days. She slept with the window open 2 days ago and woke up with the cough and congestion. Denies systemic symptoms of shortness of breath, fever, chills. Has had improvement in her cough with tea/honey. She feels as though her symptoms are already starting to improve. Encouraged her to reach out if her symptoms worsen or she develops fevers, chills etc.   Plan: - OTC flonase for congestion and continue decaffeinated tea.  -  follow up resolution next month or sooner if symptoms worsen  Patient discussed with Dr. Caffie Damme, D.O. [facility], PGY-3 Phone: [phone] Date 08/17/2022 Time 7:59 AM

## 2022-08-17 NOTE — Assessment & Plan Note (Signed)
Assessment: S/p bilateral oophorectomy and abdominal hysterectomy. After she noticed she had hot flashes day and night that have been affecting her daily life. With her depression, will start venlafaxine.   Plan: - venlafaxine 37.5 mg daily

## 2022-08-17 NOTE — Progress Notes (Signed)
Internal Medicine Clinic Attending  Case discussed with Dr. Katsadouros  At the time of the visit.  We reviewed the resident's history and exam and pertinent patient test results.  I agree with the assessment, diagnosis, and plan of care documented in the resident's note.  

## 2022-08-17 NOTE — Assessment & Plan Note (Signed)
Assessment: Continues to have breakthrough pain of her chronic migraines despite nurtec every other day. Will plan to add topomax to her regimen in hopes this helps with prevention. If not, would consider referral to migraine clinic.  Plan: - continue nurtec every other day, start topomax. - if no improvement refer to migraine clinic.

## 2022-08-28 ENCOUNTER — Other Ambulatory Visit (HOSPITAL_COMMUNITY): Payer: Self-pay

## 2022-08-28 ENCOUNTER — Other Ambulatory Visit: Payer: Self-pay | Admitting: Student

## 2022-08-28 MED ORDER — DAPAGLIFLOZIN PROPANEDIOL 5 MG PO TABS
5.0000 mg | ORAL_TABLET | Freq: Every day | ORAL | 2 refills | Status: DC
  Filled 2022-08-28: qty 30, 30d supply, fill #0
  Filled 2022-09-21 – 2022-10-09 (×3): qty 30, 30d supply, fill #1

## 2022-08-28 NOTE — Progress Notes (Signed)
Jardiance not covered by patient's insurance, will send in [place].

## 2022-08-29 ENCOUNTER — Other Ambulatory Visit: Payer: Self-pay

## 2022-08-29 ENCOUNTER — Other Ambulatory Visit (HOSPITAL_COMMUNITY): Payer: Self-pay

## 2022-09-03 ENCOUNTER — Encounter: Payer: Medicaid Other | Attending: Internal Medicine | Admitting: Registered"

## 2022-09-03 ENCOUNTER — Encounter: Payer: Self-pay | Admitting: Registered"

## 2022-09-03 ENCOUNTER — Encounter: Payer: Self-pay | Admitting: Licensed Clinical Social Worker

## 2022-09-03 ENCOUNTER — Ambulatory Visit (INDEPENDENT_AMBULATORY_CARE_PROVIDER_SITE_OTHER): Payer: Medicaid Other | Admitting: Licensed Clinical Social Worker

## 2022-09-03 VITALS — Ht 62.0 in | Wt 213.4 lb

## 2022-09-03 DIAGNOSIS — E1165 Type 2 diabetes mellitus with hyperglycemia: Secondary | ICD-10-CM | POA: Diagnosis not present

## 2022-09-03 DIAGNOSIS — F330 Major depressive disorder, recurrent, mild: Secondary | ICD-10-CM

## 2022-09-03 NOTE — BH Specialist Note (Signed)
Integrated Behavioral Health Initial In-Person Visit  MRN: [id number] Name: Regina Cantu  Number of [facility] Clinician visits: No data recorded Session Start time: 1330    Session End time: 1400  Total time in minutes: 30   Types of Service: Individual psychotherapy and Health & Behavioral Assessment/Intervention  Interpretor:No. Interpretor Name and Language: N/A   Warm Hand Off Completed.        Subjective: Regina Cantu is a 42 y.o. female accompanied by  self Patient was referred by Lynden Ang MD for Depression. Patient reports the following symptoms/concerns: Depression/ Anxiety Duration of problem: Ongoing; Severity of problem: severe  Objective: Mood:  Depressed  and Affect: Tearful Risk of harm to self or others: No plan to harm self or others  Life Context:  Self-Care: [employer] and reading , puzzle cooking makes happier, Music   Go outside and spray paint   Life Changes: Loss of Job  Patient and/or Family's Strengths/Protective Factors: Social connections  Goals Addressed: Patient will: Reduce symptoms of: stress   Progress towards Goals: Ongoing  Interventions: Interventions utilized: Supportive Counseling  Standardized Assessments completed: PHQ-SADS\     09/03/2022    1:29 PM 09/03/2022    8:00 AM 08/16/2022    9:42 AM  PHQ-SADS Last 3 Score only  Total GAD-7 Score 20  13  PHQ Adolescent Score '16 1 15      '$ Patient Centered Plan: Patient is on the following Treatment Plan(s):  To reduce stress and do things that make patient happy.   Assessment: Patient currently experiencing depression and anxiety. Patient has a fiance and 6-children. Patient reports having menopause and starting Effexor on 08/09/2022.  Patient recently loss her job and hob and had to sleep in the car for 3 weeks. Patient reported having a stroke in September 2023. 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  Patients states she doesn't sleep. Patient reports taking an over the [place] sleep aid once she hasn't slept for 3 days. Patient is currently unemployed.    Patient may benefit from ongoing counseling at this time.  Plan: Follow up with behavioral health clinician on : 09/17/2022 at 1:30 Pm Behavioral recommendations: Start doing things you enjoy. (Listening to music - Fifty[employer] holiday, JP cooper when feeling overwhelmed. Read, puzzle, cook, spray paint or diamond art).    Milus Height, MSW, [facility] Direct Dial:[phone]  Fax [phone] Main Office Phone: [phone] [address] [place], [place] [address] Website: [place], [place]

## 2022-09-03 NOTE — Progress Notes (Addendum)
Diabetes Self-Management Education  Visit Type: First/Initial  Appt. Start Time: 0800 Appt. End Time: 0915  09/03/2022  [name], identified by name and date of birth, is a 42 y.o. female with a diagnosis of Diabetes: Type 2.   ASSESSMENT  Height '5\' 2"'$  (1.575 m), weight 213 lb 6.4 oz (96.8 kg). Body mass index is 39.03 kg/m.  Lab Results  Component Value Date   HGBA1C 6.5 (A) 08/16/2022   DM Medications: Farxiga 5 mg (x2 weeks), dulaglutide (Trilicity) x2 months 8127 metformin during COVID, was sick most of the time.  SMBG: Fasting ~ 150s; random checks 100-300s Hypoglycemic sxs, ate icee and within a few min felt better and BG was 156 Pt states she has the sxs daily.  Pt states she has had health issues since she was young. Pt reports stroke latest issue  Sept 29, 2023 and states she was not given any reason for it happening. Pt states she has many stressors including financial, social and health that are weighing on her. Pt states recently started Effexor and feels like she is having side effects including hands are shaking and sweating more than she would expect. Starting with a counselor this week.  Pt states about 1 month ago was on a 2 week course of prednisoze for heel issues. Pt states she gets randomly sick once a year runs a fever ~107 deg and starts hallucinating. Pt states she gets diagnoses with diabetes, then is told she is not diabetic, has happened 3x, last time during hospital stay after meal BG was >200. She remembers pediatrician telling her she might be insulin dependent. Pt reports ovarian cancer 2001, partial hysterectomoy, 2010 full hysterectomy.   Diet:  Allergies: mushrooms, lactose intolerant Beverages: 6-8 16-oz bottles water per day, unsweetened pico tea and coffee. Pt reports changes to diet when A1c was 10% including eliminated rice, sugar, "everything" basically starving herself and A1c dropped to 6%. Makes own trail mix, nuts, (uses air fryer to  make), has peanut butter and jelly on gluten-free crackers. Food insecurity makes it difficult to have consistent access to food.  Started patient on Dexcom G7 sample. Lot #[id number], Exp 01-04-2023   Diabetes Self-Management Education - 09/03/22 0754       Visit Information   Visit Type First/Initial      Initial Visit   Diabetes Type Type 2    Are you currently following a meal plan? No    Are you taking your medications as prescribed? Yes      Health Coping   How would you rate your overall health? Poor      Psychosocial Assessment   Patient Belief/Attitude about Diabetes Defeat/Burnout    What is the hardest part about your diabetes right now, causing you the most concern, or is the most worrisome to you about your diabetes?   Other (comment)   wants to be able to eat without being afraid   How often do you need to have someone help you when you read instructions, pamphlets, or other written materials from your doctor or pharmacy? 1 - Never    What is the last grade level you completed in school? some college      Complications   Last HgB A1C per patient/outside source 6.5 %    How often do you check your blood sugar? > 4 times/day    Fasting Blood glucose range (mg/dL) 130-179    Have you had a dilated eye exam in the past 12 months?  No    Have you had a dental exam in the past 12 months? No    Are you checking your feet? Yes    How many days per week are you checking your feet? 7      Dietary Intake   Breakfast none    Snack (morning) none    Lunch none    Snack (afternoon) none    Dinner chicken, potatoes, granola, water    Beverage(s) water, unsweetened pico tea and coffee      Activity / Exercise   Activity / Exercise Type ADL's      Patient Education   Previous Diabetes Education Yes (please comment)   at fiancee's DM appt   Healthy Eating Plate Method    Monitoring Purpose and frequency of SMBG.;Taught/evaluated CGM (comment)      Individualized Goals  (developed by patient)   Nutrition General guidelines for healthy choices and portions discussed    Monitoring  Consistenly use CGM      Outcomes   Expected Outcomes Demonstrated interest in learning. Expect positive outcomes    Future DMSE 2 months    Program Status Not Completed            Individualized Plan for Diabetes Self-Management Training:   Learning Objective:  Patient will have a greater understanding of diabetes self-management. Patient education plan is to attend individual and/or group sessions per assessed needs and concerns.  Patient Instructions  Aim to eat within 1-2 hours after waking up Base your decision on a nighttime snack on your hunger level - check in with yourself, your body.  Consider including carbs in moderation, choosing more complex carbs can be easier on your blood sugar.  Food Access Resources: Out of the [employer]: AbsolutelyGenuine.com.br  There is an extensive list of pantries and free meals in [place] and surrounding areas. This list has not been updated in a while.  You may want to call the numbers provided in the guides for current information. https://themiraclesisee.wordpress.com/2016/05/01/the-little-green-book-and-the-little-blue-book-october-2017-editions/   Expected Outcomes:  Demonstrated interest in learning. Expect positive outcomes  Education material provided: My Plate , and placemat  If problems or questions, patient to contact team via:  Phone and MyChart  Future DSME appointment: 2 months

## 2022-09-03 NOTE — BH Specialist Note (Deleted)
Integrated Behavioral Health Initial In-Person Visit  MRN: [id number] Name: Regina Cantu  Number of [facility] Clinician visits: No data recorded Session Start time: 1330    Session End time: 1400  Total time in minutes: 30   Types of Service: Individual psychotherapy and Health & Behavioral Assessment/Intervention  Interpretor:No. Interpretor Name and Language: N/A   Warm Hand Off Completed.        Subjective: Regina Cantu is a 42 y.o. female accompanied by {CHL AMB ACCOMPANIED DJ:[id number]} Patient was referred by *** for ***. Patient reports the following symptoms/concerns: *** Duration of problem: ***; Severity of problem: {Mild/Moderate/Severe:20260}  Objective: Mood: {BHH MOOD:22306} and Affect: {BHH AFFECT:22307} Risk of harm to self or others: {CHL AMB BH Suicide Current Mental Status:21022748}  Life Context: Family and Social: *** School/Work: *** Self-Care: *** Life Changes: ***  Patient and/or Family's Strengths/Protective Factors: {CHL AMB BH PROTECTIVE FACTORS:[phone]}  Goals Addressed: Patient will: Reduce symptoms of: {IBH Symptoms:21014056} Increase knowledge and/or ability of: {IBH Patient Tools:21014057}  Demonstrate ability to: {IBH Goals:21014053}  Progress towards Goals: {CHL AMB BH PROGRESS TOWARDS GOALS:[phone]}  Interventions: Interventions utilized: {IBH Interventions:21014054}  Standardized Assessments completed: {IBH Screening Tools:21014051}  Patient and/or Family Response: ***  Patient Centered Plan: Patient is on the following Treatment Plan(s):  ***  Assessment: Patient currently experiencing ***.   Patient may benefit from ***.  Plan: Follow up with behavioral health clinician on : *** Behavioral recommendations: *** Referral(s): {IBH Referrals:21014055} "From scale of 1-10, how likely are you to follow plan?": ***  Regina Cantu

## 2022-09-03 NOTE — Patient Instructions (Addendum)
Aim to eat within 1-2 hours after waking up Base your decision on a nighttime snack on your hunger level - check in with yourself, your body.  Consider including carbs in moderation, choosing more complex carbs can be easier on your blood sugar.  Food Access Resources: Out of the [employer]: AbsolutelyGenuine.com.br  There is an extensive list of pantries and free meals in [place] and surrounding areas. This list has not been updated in a while.  You may want to call the numbers provided in the guides for current information. https://themiraclesisee.wordpress.com/2016/05/01/the-little-green-book-and-the-little-blue-book-october-2017-editions/

## 2022-09-06 ENCOUNTER — Other Ambulatory Visit (HOSPITAL_COMMUNITY): Payer: Self-pay

## 2022-09-11 ENCOUNTER — Other Ambulatory Visit (HOSPITAL_COMMUNITY): Payer: Self-pay

## 2022-09-13 ENCOUNTER — Telehealth: Payer: Self-pay

## 2022-09-13 ENCOUNTER — Ambulatory Visit: Payer: Managed Care, Other (non HMO)

## 2022-09-13 ENCOUNTER — Other Ambulatory Visit (HOSPITAL_COMMUNITY): Payer: Self-pay

## 2022-09-13 VITALS — BP 169/87 | HR 95 | Temp 97.5°F | Ht 62.0 in | Wt 212.5 lb

## 2022-09-13 DIAGNOSIS — I10 Essential (primary) hypertension: Secondary | ICD-10-CM | POA: Diagnosis not present

## 2022-09-13 DIAGNOSIS — E1165 Type 2 diabetes mellitus with hyperglycemia: Secondary | ICD-10-CM

## 2022-09-13 DIAGNOSIS — Z8673 Personal history of transient ischemic attack (TIA), and cerebral infarction without residual deficits: Secondary | ICD-10-CM | POA: Diagnosis not present

## 2022-09-13 DIAGNOSIS — Z7984 Long term (current) use of oral hypoglycemic drugs: Secondary | ICD-10-CM

## 2022-09-13 DIAGNOSIS — Z23 Encounter for immunization: Secondary | ICD-10-CM | POA: Diagnosis not present

## 2022-09-13 DIAGNOSIS — Z0000 Encounter for general adult medical examination without abnormal findings: Secondary | ICD-10-CM

## 2022-09-13 DIAGNOSIS — Z794 Long term (current) use of insulin: Secondary | ICD-10-CM

## 2022-09-13 DIAGNOSIS — I639 Cerebral infarction, unspecified: Secondary | ICD-10-CM

## 2022-09-13 MED ORDER — AMLODIPINE BESYLATE 5 MG PO TABS
5.0000 mg | ORAL_TABLET | Freq: Every day | ORAL | 2 refills | Status: DC
  Filled 2022-09-13: qty 60, 60d supply, fill #0
  Filled 2022-11-05: qty 60, 60d supply, fill #1
  Filled 2023-01-06: qty 60, 60d supply, fill #2

## 2022-09-13 MED ORDER — DEXCOM G7 RECEIVER DEVI
Freq: Every day | 12 refills | Status: DC
  Filled 2022-09-13: qty 1, 30d supply, fill #0
  Filled 2022-11-08 – 2022-12-26 (×4): qty 1, 30d supply, fill #1

## 2022-09-13 MED ORDER — DEXCOM G7 SENSOR MISC
Freq: Every day | 12 refills | Status: DC
  Filled 2022-09-13: qty 1, 30d supply, fill #0
  Filled 2022-09-13: qty 3, 30d supply, fill #0
  Filled 2022-09-24 – 2022-10-09 (×2): qty 3, 30d supply, fill #1
  Filled 2022-11-08: qty 3, 30d supply, fill #2
  Filled 2022-11-27 – 2022-11-30 (×2): qty 3, 30d supply, fill #3

## 2022-09-13 NOTE — Assessment & Plan Note (Signed)
Patient has a history of HTN. Current medications include losartan-hydrochlorothiazide 50-12.5 mg daily. Patient states that they are compliant with this medication and took this today. Patient states that they do check their BP regularly at home and typically 174Y-814G systollically. Patient denies lightheadedness, dizziness, CP, or SOB. Initial BP today is 155/89. Repeat BP is 169/87. Will start amlodipine 5 mg daily. If BP remains elevated at next visit, consider increasing dosage of losartan-hydrochlorothiazide.   Plan: - Continue losartan-hydrochlorothiazide 50-12.5 mg daily - Start Amlodipine 5 mg daily

## 2022-09-13 NOTE — Addendum Note (Signed)
Addended byKerin Perna on: 09/13/2022 11:07 AM   Modules accepted: Orders

## 2022-09-13 NOTE — Assessment & Plan Note (Addendum)
Patient has a history of T2DM. Current medications include Farxiga 5 mg daily and Trulicity 3.54 mg weekly. Patient states that they are compliant with these medications. Patient does check their blood sugar at home regularly and notes values typically in the low 100s. No lows at home. Patient endorses polydipsia but denies polyuria or fatigue. Patient states that they do visit the ophthalmologist for yearly eye exams. A1c was 6.5% four weeks ago. She did recently lose her CGM and is requesting a new one from the clinic. Will message our diabetic coordinator about obtaining another CGM. Will try to prescribe CGM to see if her insurance will cover this.    Plan: - Continue Farxiga 5 mg daily and Trulicity 3.01 mg weekly - F/u ophthalmology for yearly eye exams - Repeat A1c in 2 months

## 2022-09-13 NOTE — Telephone Encounter (Signed)
Dexcom G7 Receiver  Decision:Approved  09/13/2022-01/04/2023

## 2022-09-13 NOTE — Addendum Note (Signed)
Addended by: Starlyn Skeans on: 09/13/2022 11:21 AM   Modules accepted: Orders

## 2022-09-13 NOTE — Progress Notes (Signed)
CC: HTN f/u  HPI:  [name] is a 42 y.o. female with past medical history of HTN, CVA (04/2022), T2DM, ovarian cancer (s/p oophorectomy and abdominal hysterectomy), migraine, and depression that presents for f/u of HTN.   Patient has a history of HTN. Current medications include losartan-hydrochlorothiazide 50-12.5 mg daily. Patient states that they are compliant with this medication and took this today. Patient states that they do check their BP regularly at home and typically 474Q-595G systollically. Patient denies lightheadedness, dizziness, CP, or SOB.   Patient has a history of T2DM. Current medications include Farxiga 5 mg daily and Trulicity 3.87 mg weekly. Patient states that they are compliant with these medications. Patient does check their blood sugar at home regularly and notes values typically in the low 100s. No lows at home. Patient endorses polydipsia but denies polyuria or fatigue. Patient states that they do visit the ophthalmologist for yearly eye exams. She did recently lose her CGM and is requesting a new one from the clinic.   Patient has a history of CVA in 04/2022 w/o residual deficits. Patient completed course of plavix. Current medications include aspirin EC 81 mg daily and atorvastatin 80 mg daily. Patient states that they are compliant with these medications. Denies new weakness, numbness, tingling, or difficulty speaking.  Patient is agreeable to receiving tdap today and screening for Hep C.   Allergies as of 09/13/2022       Reactions   Bee Venom Anaphylaxis   Mushroom Extract Complex Anaphylaxis   Wasp Venom Anaphylaxis   Latex Rash   Penicillins Hives, Nausea And Vomiting, Rash        Medication List        Accurate as of September 13, 2022 10:47 AM. If you have any questions, ask your nurse or doctor.          Accu-Chek Guide w/Device Kit Use to test up to 4 (four) times daily as directed   Accu-Chek Softclix Lancets lancets Use to  test blood sugar 4 (four) times daily.   amLODipine 5 MG tablet Commonly known as: NORVASC Take 1 tablet (5 mg total) by mouth daily. Started by: Starlyn Skeans, MD   aspirin EC 81 MG tablet Take 1 tablet (81 mg total) by mouth daily. Swallow whole.   atorvastatin 80 MG tablet Commonly known as: LIPITOR Take 1 tablet (80 mg total) by mouth daily.   Farxiga 5 MG Tabs tablet Generic drug: dapagliflozin propanediol Take 1 tablet (5 mg total) by mouth daily before breakfast.   losartan-hydrochlorothiazide 50-12.5 MG tablet Commonly known as: Hyzaar Take 1 tablet by mouth daily.   Nurtec 75 MG Tbdp Generic drug: Rimegepant Sulfate Take 1/2 tablet (37.5 mg total) by mouth daily as needed   predniSONE 5 MG tablet Commonly known as: DELTASONE Take 6 tabs daily for 2 days then 5 tabs daily x 2 days, then 4 tabs daily x 2 days, then 3 tabs daily x 2 days, then 2 tabs daily x 2 days then 1 tab daily x 2 days, then stop   topiramate 25 MG tablet Commonly known as: Topamax Take 1 tablet (25 mg total) by mouth at bedtime.   Trulicity 5.64 PP/2.9JJ Sopn Generic drug: Dulaglutide Inject 0.75 mg into the skin once a week.   venlafaxine XR 37.5 MG 24 hr capsule Commonly known as: EFFEXOR-XR Take 1 capsule (37.5 mg total) by mouth daily with breakfast.         Past Medical History:  Diagnosis  Date   Anxiety    Cancer ([place])    ovarian 2001 and 2010   Depression    Essential hypertension 05/04/2022   Hypertension    Migraine    Pityriasis rosea    Uncontrolled type 2 diabetes mellitus with hyperglycemia ([employer]) 05/04/2022   Review of Systems:  per HPI.   Physical Exam: Vitals:   09/13/22 0950 09/13/22 1030  BP: (!) 155/89 (!) 169/87  Pulse: 99 95  Temp: (!) 97.5 F (36.4 C)   TempSrc: Oral   SpO2: 100%   Weight: 212 lb 8 oz (96.4 kg)   Height: '5\' 2"'$  (1.575 m)    Constitutional: Well-developed, well-nourished, appears comfortable  HENT: Normocephalic and atraumatic.   Eyes: EOM are normal. PERRL.  Neck: Normal range of motion.  Cardiovascular: Regular rate, regular rhythm. No murmurs, rubs, or gallops. Normal radial and PT pulses bilaterally. No LE edema.  Pulmonary: Normal respiratory effort. No wheezes, rales, or rhonchi.   Abdominal: Soft. Non-distended. No tenderness. Normal bowel sounds.  Musculoskeletal: Normal range of motion.     Neurological: Alert and oriented to person, place, and time. Non-focal. Skin: warm and dry.    Assessment & Plan:   See Encounters Tab for problem based charting.  Patient seen with Dr. Cain Sieve.

## 2022-09-13 NOTE — Telephone Encounter (Addendum)
Dexcom G7 sensor  Decision:Approved 09/13/2022-01/04/2023

## 2022-09-13 NOTE — Patient Instructions (Addendum)
Thank you for coming to see [place] in clinic [name].   Plan: - Please continue taking losartan-hydrochlorothiazide 50-12.5 mg daily for your high blood pressure  - Please start taking amlodipine 5 mg daily for your high blood pressure  - I have messaged our diabetic coordinator regarding getting a new CGM (we will call you with a plan to get this replaced)  - We drew your blood today to check your cholesterol and to screen for Hepatitis C (we will call you with these results)  - We gave you the tetanus booster today   It was very nice to see you, thank you for allowing [place] to be involved in your care.   Please arrive to your next appointment 5-10 minutes before your scheduled appointment time, thank you!

## 2022-09-13 NOTE — Assessment & Plan Note (Signed)
Tdap administered today. Screened for Hep C today.

## 2022-09-13 NOTE — Assessment & Plan Note (Signed)
Patient has a history of CVA in 04/2022 w/o residual deficits. Patient completed course of plavix. Current medications include aspirin EC 81 mg daily and atorvastatin 80 mg daily. Patient states that they are compliant with these medications. Denies new weakness, numbness, tingling, or difficulty speaking. Lipid panel from 4 months ago WNL w/ exception of total cholesterol 247, TG 224, VLDL 45, and LDL 159. Will repeat lipid panel today. Could consider adding zetia if no significant LDL reduction. 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 Repeat CT angio from 05/2022 demonstrates unchanged protrusion favored to represent infundibulum. Ambulatory referral for neuro IR was ordered upon discharge from the hospital in 04/2022, however, the patient has not seen them yet. Will speak with radiology today regarding need for this referral or repeat imaging.   Plan: - Lipid panel today - Continue aspirin EC 81 mg daily and atorvastatin 80 mg daily

## 2022-09-13 NOTE — Telephone Encounter (Addendum)
Prior Authorization for patient (Dexcom G7 sensor) (Dexcom G7 Receiver)came through on cover my meds was submitted with last office notes and labs awaiting approval or denial

## 2022-09-14 ENCOUNTER — Other Ambulatory Visit (HOSPITAL_COMMUNITY): Payer: Self-pay

## 2022-09-14 LAB — LIPID PANEL
Chol/HDL Ratio: 3.8 ratio (ref 0.0–4.4)
Cholesterol, Total: 152 mg/dL (ref 100–199)
HDL: 40 mg/dL (ref >39)
LDL Chol Calc (NIH): 91 mg/dL (ref 0–99)
Triglycerides: 116 mg/dL (ref 0–149)
VLDL Cholesterol Cal: 21 mg/dL (ref 5–40)

## 2022-09-14 LAB — HCV AB W REFLEX TO QUANT PCR: HCV Ab: NONREACTIVE

## 2022-09-14 LAB — HCV INTERPRETATION

## 2022-09-14 NOTE — Progress Notes (Signed)
Internal Medicine Clinic Attending  Case discussed with Dr. Alton Revere  At the time of the visit.  We reviewed the resident's history and exam and pertinent patient test results.  I agree with the assessment, diagnosis, and plan of care documented in the resident's note.

## 2022-09-14 NOTE — Progress Notes (Signed)
Called and updated on normal results. Discussed LDL goal is < 70 given history of CVA. Will continue with atorvastatin 80 mg daily. Patient agrees with the plan.

## 2022-09-17 ENCOUNTER — Ambulatory Visit (INDEPENDENT_AMBULATORY_CARE_PROVIDER_SITE_OTHER): Payer: Medicaid Other | Admitting: Licensed Clinical Social Worker

## 2022-09-17 DIAGNOSIS — F330 Major depressive disorder, recurrent, mild: Secondary | ICD-10-CM

## 2022-09-17 NOTE — BH Specialist Note (Signed)
Integrated Behavioral Health Follow Up In-Person Visit  MRN: [id number] Name: Gwendolyn Fill  Number of [place] Clinician visits: No data recorded Session Start time: 1330   Session End time: 1423 Total time in minutes: 43  Types of Service: Individual psychotherapy and General Behavioral Integrated Care (BHI)   Subjective: Carsyn Ahnya Saturday is a 42 y.o. female accompanied by  self Patient was referred by PCP for Depression/ Anxiety. Patient reports the following symptoms/concerns: Stress   Objective: Mood: Anxious and Affect: Appropriate Risk of harm to self or others: No plan to harm self or others   Goals Addressed: Patient will:  Reduce symptoms of: Stress   Progress towards Goals: Ongoing  Interventions: Interventions utilized:  Supportive Counseling Standardized Assessments completed: PHQ-SADS    09/13/2022   10:15 AM 09/03/2022    1:29 PM 09/03/2022    8:00 AM  PHQ-SADS Last 3 Score only  Total GAD-7 Score 17 20   PHQ Adolescent Score 23 16 1     $ Patient Centered Plan: Patient is on the following Treatment Plan(s): Self Care activities and counseling with [facility] Assessment: Patient currently experiencing Patient experiencing a lot of stress. Patient unemployment recently ended. 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 Patient has six children. Patient is working on self care. Patient stated she will be getting a 80- minute massage on the 22nd of February. She also is spending more time watching her favorite tv shows [facility] [place], [employer]). She enjoys writing poetry and playing piano.   Patient may benefit from ongoing counseling, writing poetry and practicing self care.  Plan: Follow up with behavioral health clinician on : March 11 at 1:30Pm in person  Milus Height, MSW, [place]  Fax [phone] Main  Office Phone: [phone] [place], [place] [address] Website: [place], [place]

## 2022-09-20 ENCOUNTER — Other Ambulatory Visit: Payer: Self-pay | Admitting: Internal Medicine

## 2022-09-20 MED ORDER — TRULICITY 0.75 MG/0.5ML ~~LOC~~ SOAJ
0.7500 mg | SUBCUTANEOUS | 2 refills | Status: DC
  Filled 2022-09-20 – 2022-10-09 (×3): qty 2, 28d supply, fill #0

## 2022-09-21 ENCOUNTER — Other Ambulatory Visit (HOSPITAL_COMMUNITY): Payer: Self-pay

## 2022-09-24 ENCOUNTER — Other Ambulatory Visit (HOSPITAL_COMMUNITY): Payer: Self-pay

## 2022-09-26 ENCOUNTER — Other Ambulatory Visit (HOSPITAL_COMMUNITY): Payer: Self-pay

## 2022-09-27 ENCOUNTER — Other Ambulatory Visit (HOSPITAL_COMMUNITY): Payer: Self-pay

## 2022-09-28 ENCOUNTER — Other Ambulatory Visit (HOSPITAL_COMMUNITY): Payer: Self-pay

## 2022-10-01 ENCOUNTER — Telehealth: Payer: Self-pay

## 2022-10-01 ENCOUNTER — Other Ambulatory Visit (HOSPITAL_COMMUNITY): Payer: Self-pay

## 2022-10-01 NOTE — Telephone Encounter (Signed)
Pa for pt (  FARXIGA / TRULICITY )  came through on cover my meds was submitted with last office notes and labs  for both ... Awaiting approval or denial

## 2022-10-02 NOTE — Telephone Encounter (Signed)
DECISION :     Both meds the same  message was faxed over  pt has an alternative pharmacy benefit  and [facility] can't  process the request          ( [place] )

## 2022-10-03 ENCOUNTER — Other Ambulatory Visit: Payer: Self-pay

## 2022-10-03 NOTE — Progress Notes (Signed)
Patient outreached by Angus Seller, PharmD Candidate on 10/03/22 to discuss hypertension.   Patient has an automated home blood pressure machine. They report home readings 154/98. She says it's consistently been high and she hasn't seen a difference since her medications have been changed (added amlodipine on 2/8, switched from lisinopril to Hyzaar on 1/11).   Medication review was performed. They are taking medications as prescribed.   The following barriers to adherence were noted:  - They do have cost concerns with Trulicity. Recently switched insurances, so figuring out coverage now.  - They do not have transportation concerns.  - They do not need assistance obtaining refills.  - They do not occasionally forget to take some of their prescribed medications.  - They do not feel like one/some of their medications make them feel poorly.  - They do not have questions or concerns about their medications, but it seems like her BP meds are not optimized.  - They do have follow up scheduled with their primary care provider/cardiologist.   The following interventions were completed:  - Medications were reviewed  - Patient was educated on goal blood pressures and long term health implications of elevated blood pressure  - Patient was educated on medications, including indication and administration  - Patient was counseled on lifestyle modifications to improve blood pressure, including diet and exercise. She's been more aware of her diet and exercise since her recent stroke, but she is trying to work on it.   The patient has follow up scheduled: 11/09/2022  PCP: Dr. Jenita Seashore, PharmD Candidate  [employer]

## 2022-10-09 ENCOUNTER — Other Ambulatory Visit (HOSPITAL_COMMUNITY): Payer: Self-pay

## 2022-10-09 ENCOUNTER — Other Ambulatory Visit: Payer: Self-pay | Admitting: Student

## 2022-10-09 ENCOUNTER — Other Ambulatory Visit: Payer: Self-pay

## 2022-10-09 DIAGNOSIS — I10 Essential (primary) hypertension: Secondary | ICD-10-CM

## 2022-10-09 DIAGNOSIS — F32A Depression, unspecified: Secondary | ICD-10-CM

## 2022-10-10 ENCOUNTER — Other Ambulatory Visit (HOSPITAL_COMMUNITY): Payer: Self-pay

## 2022-10-10 NOTE — Telephone Encounter (Signed)
Next appt scheduled 4/5 with PCP.

## 2022-10-11 ENCOUNTER — Other Ambulatory Visit (HOSPITAL_COMMUNITY): Payer: Self-pay

## 2022-10-11 MED ORDER — LOSARTAN POTASSIUM-HCTZ 50-12.5 MG PO TABS
1.0000 | ORAL_TABLET | Freq: Every day | ORAL | 1 refills | Status: DC
  Filled 2022-10-11: qty 30, 30d supply, fill #0
  Filled 2022-11-08: qty 30, 30d supply, fill #1

## 2022-10-11 MED ORDER — VENLAFAXINE HCL ER 37.5 MG PO CP24
37.5000 mg | ORAL_CAPSULE | Freq: Every day | ORAL | 1 refills | Status: DC
  Filled 2022-10-11: qty 30, 30d supply, fill #0
  Filled 2022-11-08: qty 30, 30d supply, fill #1

## 2022-10-12 ENCOUNTER — Other Ambulatory Visit: Payer: Self-pay

## 2022-10-12 ENCOUNTER — Other Ambulatory Visit (HOSPITAL_COMMUNITY): Payer: Self-pay

## 2022-10-15 ENCOUNTER — Other Ambulatory Visit: Payer: Self-pay | Admitting: *Deleted

## 2022-10-15 ENCOUNTER — Ambulatory Visit (INDEPENDENT_AMBULATORY_CARE_PROVIDER_SITE_OTHER): Payer: Medicaid Other | Admitting: Licensed Clinical Social Worker

## 2022-10-15 ENCOUNTER — Encounter: Payer: Self-pay | Admitting: Registered"

## 2022-10-15 ENCOUNTER — Encounter: Payer: Medicaid Other | Attending: Internal Medicine | Admitting: Registered"

## 2022-10-15 ENCOUNTER — Other Ambulatory Visit (HOSPITAL_COMMUNITY): Payer: Self-pay

## 2022-10-15 DIAGNOSIS — E1165 Type 2 diabetes mellitus with hyperglycemia: Secondary | ICD-10-CM | POA: Diagnosis not present

## 2022-10-15 DIAGNOSIS — F330 Major depressive disorder, recurrent, mild: Secondary | ICD-10-CM

## 2022-10-15 NOTE — Progress Notes (Signed)
Diabetes Self-Management Education  Visit Type: Follow-up  Appt. Start Time: 1018 Appt. End Time: 1110  10/15/2022  [name], identified by name and date of birth, is a 42 y.o. female with a diagnosis of Diabetes: Type 2.   ASSESSMENT  Lab Results  Component Value Date   HGBA1C 6.5 (A) 08/16/2022  A1c was over last 4 yrs: 10.1%; 7.6%, 8.4%  Relevant Medical History: s/p stroke 2023 DM Medications: Pt reports insurance wouldn't pay for her prescribed medications [place], Trulicity). Using s/o Jardiance ~2-3 weeks.  Dexcom 14-day Summary: 1% very high, 14% high, 84% TIR, 1% Low (Pt reports 55 a couple of times)   Pt reports many concerns about her health, including financial stress over the cost of her medications (>$2000/mo), sxs of numbness, urinary incontinent, frequent low blood sugar sxs, anxiety and depression and frequent crying. Pt reports feeling overwhelmed by feeling heavy load of responsibility to take care of others.   Pt states she had a MD visit, states her sugar jmped high, felt heavy and she was prescribed water pills. Pt states after taking 2 pills she urinated and realized that she had not urinated in 4 days. Pt reports that after she started taking her significant other's left over Jardiance urination and constipation issues resolved.  Pt states when she gets constipated she will fast as to prevent further issues of making BM difficult.   Diet: small portion, irregular meals but starting to eat breakfast sometimes. Experimenting with different foods to see how affects blood sugar via CGM data. Allergies: mushrooms, lactose intolerant  Individualized Plan for Diabetes Self-Management Training:   Learning Objective:  Patient will have a greater understanding of diabetes self-management. Patient education plan is to attend individual and/or group sessions per assessed needs and concerns.  Patient Instructions  Let MD know what you are taking and your  issues with insurance coverage. Continue watching for patterns with Dexcom Continue to work on eating breakfast daily - you have a goal to use the protein drink you got on days when you don't feel like eating a regular meal. Avoid strenuous exercise when you haven't eaten that day. Spend some time thinking of other ways to burn off stress.   Expected Outcomes:  Demonstrated interest in learning. Expect positive outcomes  Education material provided: none  If problems or questions, patient to contact team via:  Phone and MyChart  Future DSME appointment: 4-6 weeks

## 2022-10-15 NOTE — BH Specialist Note (Signed)
Integrated Behavioral Health Follow Up In-Person Visit  MRN: [id number] Name: Regina Cantu  Number of [place] Clinician visits: 2- Second Visit  Session Start time: [id number]   Session End time: [id number]  Total time in minutes: 63   Types of Service: Individual psychotherapy  Interpretor:No. Interpretor Name and Language: N/A  Subjective: Regina Cantu is a 42 y.o. female  Patient was referred by PCP for Anxiety/ Depression. Patient reports the following symptoms/concerns: Depression, irratablity Duration of problem: years; Severity of problem: severe  Objective: Mood: Irritable and Affect: Tearful Risk of harm to self or others: No plan to harm self or others  Patient and/or Family's Strengths/Protective Factors: Social connections and Social and Emotional competence  Goals Addressed: Patient will:  Reduce symptoms of: anxiety and depression    Progress towards Goals: Ongoing  Interventions: Interventions utilized:  Motivational Interviewing, Mindfulness or Relaxation Training, and Supportive Counseling Standardized Assessments completed: PHQ-SADS    09/13/2022   10:15 AM 09/03/2022    1:29 PM 09/03/2022    8:00 AM  PHQ-SADS Last 3 Score only  Total GAD-7 Score 17 20   PHQ Adolescent Score '23 16 1      '$ Assessment: Patient currently experiencing Patient experiencing a lot of stress, irritability and sadness. Patient requested an increase dosage in Effexor. [facility] sent request to PCP. Patient is waiting on authorization for her diabetic medications. Patient stated she has been self medicating with fiances medications. [facility] sent message to PCP and nurse triage. Patient unemployment recently ended. 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 Patient has six children. Patient is working on self care. Patient expressed her enjoyment if seeing [facility]. Patient wishes to continue sessions.    Patient may benefit from ongoing counseling,  writing poetry and practicing self care.   Plan: Follow up with behavioral health clinician on : April 3 at 1:30Pm in person

## 2022-10-15 NOTE — Patient Instructions (Signed)
Let MD know what you are taking and your issues with insurance coverage. Continue watching for patterns with Dexcom Continue to work on eating breakfast daily - you have a goal to use the protein drink you got on days when you don't feel like eating a regular meal. Avoid strenuous exercise when you haven't eaten that day. Spend some time thinking of other ways to burn off stress.

## 2022-10-16 NOTE — Telephone Encounter (Signed)
I need a pa request from the pharmacy, Mrs.Gladys do you know if the pharmacy is going to fax one over?

## 2022-10-18 ENCOUNTER — Other Ambulatory Visit (HOSPITAL_COMMUNITY): Payer: Self-pay

## 2022-10-18 MED ORDER — DAPAGLIFLOZIN PROPANEDIOL 5 MG PO TABS
5.0000 mg | ORAL_TABLET | Freq: Every day | ORAL | 2 refills | Status: DC
  Filled 2022-10-18: qty 30, 30d supply, fill #0
  Filled 2022-11-25: qty 30, 30d supply, fill #1
  Filled 2022-12-24: qty 30, 30d supply, fill #2

## 2022-10-18 MED ORDER — TRULICITY 0.75 MG/0.5ML ~~LOC~~ SOAJ
0.7500 mg | SUBCUTANEOUS | 2 refills | Status: DC
  Filled 2022-10-18 – 2022-10-24 (×2): qty 2, 28d supply, fill #0
  Filled 2022-11-17: qty 2, 28d supply, fill #1
  Filled 2022-12-13: qty 2, 28d supply, fill #2

## 2022-10-18 MED ORDER — NURTEC 75 MG PO TBDP
37.5000 mg | ORAL_TABLET | Freq: Every day | ORAL | 0 refills | Status: DC | PRN
  Filled 2022-10-18: qty 15, 30d supply, fill #0
  Filled 2022-10-29: qty 16, 32d supply, fill #0
  Filled 2022-10-29: qty 8, 30d supply, fill #0
  Filled 2022-12-31: qty 8, 30d supply, fill #1
  Filled 2023-02-05 – 2023-04-01 (×2): qty 8, 30d supply, fill #2
  Filled 2023-05-28: qty 8, 30d supply, fill #3
  Filled 2023-06-04 – 2023-07-22 (×2): qty 8, 30d supply, fill #4
  Filled 2023-08-28: qty 8, 30d supply, fill #5
  Filled 2023-09-08: qty 8, 30d supply, fill #6

## 2022-10-19 ENCOUNTER — Other Ambulatory Visit (HOSPITAL_COMMUNITY): Payer: Self-pay

## 2022-10-23 ENCOUNTER — Other Ambulatory Visit (HOSPITAL_COMMUNITY): Payer: Self-pay

## 2022-10-23 ENCOUNTER — Other Ambulatory Visit: Payer: Self-pay

## 2022-10-23 ENCOUNTER — Telehealth: Payer: Self-pay

## 2022-10-23 NOTE — Telephone Encounter (Addendum)
Decision:Approved The pharmacy department has reviewed the request submitted by Regina Cantu for the following medications.  Dryg:Trulicity subcutaneous solution pen-injector 0.75mg /0.73ml  Quantity:6  Duration:90  And has determined the following:  Trulicity subcutaneous solution pen-injector 0.75mg /0.36ml a quantity of 6 for 90 days has been approved for X months from 10/23/22-01/04/23.

## 2022-10-23 NOTE — Telephone Encounter (Signed)
Prior Authorization for patient Regina Cantu) came through on cover my meds was submitted with last office notes and labs awaiting approval or denial.

## 2022-10-23 NOTE — Telephone Encounter (Signed)
Prior Authorization for patient (Trulicity) came through on cover my meds was submitted with last office notes and labs awaiting approval or denial 

## 2022-10-24 ENCOUNTER — Other Ambulatory Visit (HOSPITAL_COMMUNITY): Payer: Self-pay

## 2022-10-24 MED ORDER — TRULICITY 0.75 MG/0.5ML ~~LOC~~ SOAJ
0.7500 mg | SUBCUTANEOUS | 0 refills | Status: DC
  Filled 2022-10-24: qty 6, 84d supply, fill #0

## 2022-10-24 NOTE — Telephone Encounter (Addendum)
Decision:Approved  The pharmacy department has reviewed the request submitted by Rayann Atway for the following medications.   Drug:Farxiga oral tablet 5 mg Quantity:90   Duration:90   And has determined the following:   Farxiga oral tablet 5 mg a quantity of 90 for 90 days has been approved for 2 months from 10/23/2022 to 01/04/2023.

## 2022-10-25 ENCOUNTER — Other Ambulatory Visit (HOSPITAL_COMMUNITY): Payer: Self-pay

## 2022-10-25 ENCOUNTER — Other Ambulatory Visit: Payer: Self-pay

## 2022-10-26 ENCOUNTER — Other Ambulatory Visit: Payer: Self-pay | Admitting: Student

## 2022-10-26 ENCOUNTER — Other Ambulatory Visit: Payer: Self-pay | Admitting: Internal Medicine

## 2022-10-26 ENCOUNTER — Other Ambulatory Visit (HOSPITAL_COMMUNITY): Payer: Self-pay

## 2022-10-26 ENCOUNTER — Telehealth: Payer: Self-pay

## 2022-10-26 DIAGNOSIS — G43719 Chronic migraine without aura, intractable, without status migrainosus: Secondary | ICD-10-CM

## 2022-10-26 NOTE — Telephone Encounter (Signed)
A Prior Authorization was initiated for this patients NURTEC through CoverMyMeds.   Key: [id number]

## 2022-10-29 ENCOUNTER — Other Ambulatory Visit (HOSPITAL_COMMUNITY): Payer: Self-pay

## 2022-10-29 MED ORDER — ATORVASTATIN CALCIUM 80 MG PO TABS
80.0000 mg | ORAL_TABLET | Freq: Every day | ORAL | 3 refills | Status: DC
  Filled 2022-10-29: qty 90, 90d supply, fill #0
  Filled 2023-01-28: qty 90, 90d supply, fill #1

## 2022-10-29 NOTE — Telephone Encounter (Signed)
Prior Auth for patients medication NURTEC ODT 75MG  approved by [employer] from 10/26/22 to 01/04/23.  CoverMyMeds Key: [id number]

## 2022-10-31 ENCOUNTER — Other Ambulatory Visit (HOSPITAL_COMMUNITY): Payer: Self-pay

## 2022-10-31 MED ORDER — TOPIRAMATE 25 MG PO TABS
25.0000 mg | ORAL_TABLET | Freq: Every day | ORAL | 3 refills | Status: DC
  Filled 2022-10-31: qty 30, 30d supply, fill #0
  Filled 2022-12-09: qty 30, 30d supply, fill #1
  Filled 2023-01-06: qty 30, 30d supply, fill #2
  Filled 2023-02-01: qty 30, 30d supply, fill #3

## 2022-11-05 ENCOUNTER — Other Ambulatory Visit (HOSPITAL_COMMUNITY): Payer: Self-pay

## 2022-11-07 ENCOUNTER — Ambulatory Visit (INDEPENDENT_AMBULATORY_CARE_PROVIDER_SITE_OTHER): Payer: Medicaid Other | Admitting: Licensed Clinical Social Worker

## 2022-11-07 DIAGNOSIS — F330 Major depressive disorder, recurrent, mild: Secondary | ICD-10-CM

## 2022-11-07 NOTE — BH Specialist Note (Signed)
Integrated Behavioral Health Follow Up In-Person Visit  MRN: [id number] Name: Regina Cantu  Number of [place] Clinician visits: 2- Second Visit  Session Start time: 1330   Session End time: 1430  Total time in minutes: 60   Types of Service: Individual psychotherapy and General Behavioral Integrated Care (BHI)  Interpretor:No. Interpretor Name and Language: N/A  Subjective: Regina Cantu is a 42 y.o. female  Patient was referred by PCP for Anxiety/ Depression. Patient reports the following symptoms/concerns: Depression, irratablity Duration of problem: years; Severity of problem: severe   Objective: Mood: Irritable and Affect: Tearful Risk of harm to self or others: No plan to harm self or others   Patient and/or Family's Strengths/Protective Factors: Social connections and Social and Emotional competence   Goals Addressed: Patient will:  Reduce symptoms of: anxiety and depression      Progress towards Goals: Ongoing   Interventions: Interventions utilized:  Motivational Interviewing, Mindfulness or Relaxation Training, and Supportive Counseling Standardized Assessments completed: PHQ-SADS    09/13/2022   10:15 AM 09/03/2022    1:29 PM 09/03/2022    8:00 AM  PHQ-SADS Last 3 Score only  Total GAD-7 Score 17 20   PHQ Adolescent Score 23 16 1       Assessment: Patient currently experiencing Patient experiencing a lot of stress, irritability and sadness. Patient expressed her enjoyment in seeing [facility] and how its' positively impacting her . Patient continues to journal, listen to music and spending time with daughters. Patient is practicing self care and getting a massage on 04/17. Patient wishes to continue sessions and scheduled follow up appointment for May 2 at 1:30 Pm.    Patient may benefit from ongoing counseling, writing poetry and practicing self care.   Plan: Follow up with behavioral health clinician on : May 2 at 1:30Pm in  person  Milus Height, MSW, [place]  Fax [phone] Main Office Phone: [phone] [place], [place] [address] Website: [place], [place]

## 2022-11-08 ENCOUNTER — Other Ambulatory Visit: Payer: Self-pay

## 2022-11-08 ENCOUNTER — Other Ambulatory Visit (HOSPITAL_COMMUNITY): Payer: Self-pay

## 2022-11-09 ENCOUNTER — Ambulatory Visit: Payer: Medicaid Other | Admitting: Internal Medicine

## 2022-11-09 ENCOUNTER — Encounter: Payer: Self-pay | Admitting: Internal Medicine

## 2022-11-09 ENCOUNTER — Other Ambulatory Visit (HOSPITAL_COMMUNITY): Payer: Self-pay

## 2022-11-09 ENCOUNTER — Other Ambulatory Visit: Payer: Self-pay

## 2022-11-09 VITALS — BP 124/83 | HR 75 | Temp 98.0°F | Wt 203.3 lb

## 2022-11-09 DIAGNOSIS — Z7984 Long term (current) use of oral hypoglycemic drugs: Secondary | ICD-10-CM | POA: Diagnosis not present

## 2022-11-09 DIAGNOSIS — E1165 Type 2 diabetes mellitus with hyperglycemia: Secondary | ICD-10-CM

## 2022-11-09 DIAGNOSIS — Z9641 Presence of insulin pump (external) (internal): Secondary | ICD-10-CM | POA: Diagnosis not present

## 2022-11-09 DIAGNOSIS — F32A Depression, unspecified: Secondary | ICD-10-CM

## 2022-11-09 DIAGNOSIS — I10 Essential (primary) hypertension: Secondary | ICD-10-CM

## 2022-11-09 DIAGNOSIS — B379 Candidiasis, unspecified: Secondary | ICD-10-CM | POA: Insufficient documentation

## 2022-11-09 LAB — GLUCOSE, CAPILLARY: Glucose-Capillary: 127 mg/dL — ABNORMAL HIGH (ref 70–99)

## 2022-11-09 LAB — POCT GLYCOSYLATED HEMOGLOBIN (HGB A1C): Hemoglobin A1C: 6 % — AB (ref 4.0–5.6)

## 2022-11-09 MED ORDER — VENLAFAXINE HCL ER 75 MG PO CP24
75.0000 mg | ORAL_CAPSULE | Freq: Every day | ORAL | 2 refills | Status: DC
  Filled 2022-11-09: qty 90, 90d supply, fill #0
  Filled 2023-02-01: qty 90, 90d supply, fill #1

## 2022-11-09 MED ORDER — OXISTAT 1 % EX LOTN
TOPICAL_LOTION | Freq: Two times a day (BID) | CUTANEOUS | 0 refills | Status: DC
  Filled 2022-11-09 – 2022-11-14 (×2): qty 30, 30d supply, fill #0
  Filled 2022-11-17 – 2022-11-25 (×2): qty 30, 10d supply, fill #0

## 2022-11-09 NOTE — Patient Instructions (Signed)
Thank you, Ms.Ricky Omer Jack for allowing [place] to provide your care today. Today we discussed:  High blood pressure: Keep taking losartan-HCTZ once a day and amlodipine once a day  Diabetes: your diabetes is under excellent control! Keep taking farxiga 5 mg everyday + trulicity once a week!  Depression Increase effexor to 75 mg daily (sent in new prescription) Keep meeting with Marena Chancy and using techniques she has given you to deal with stress Follow up with our clinic in 4 weeks to re-assess depression   I have ordered the following labs for you:   Lab Orders         Glucose, capillary         POC Hbg A1C      Referrals ordered today:   Referral Orders  No referral(s) requested today     I have ordered the following medication/changed the following medications:   Stop the following medications: Medications Discontinued During This Encounter  Medication Reason   venlafaxine XR (EFFEXOR-XR) 37.5 MG 24 hr capsule Reorder     Start the following medications: Meds ordered this encounter  Medications   venlafaxine XR (EFFEXOR-XR) 75 MG 24 hr capsule    Sig: Take 1 capsule (75 mg total) by mouth daily with breakfast.    Dispense:  90 capsule    Refill:  2     Follow up:  4 weeks      Should you have any questions or concerns please call the internal medicine clinic at [phone].     Elza Rafter, D.O. [facility]

## 2022-11-09 NOTE — Assessment & Plan Note (Signed)
A1c is improved from 6.5% to 6% today.  The patient has been out of her Farxiga 5 mg and Trulicity 0.75 mg for about 2 months (insurance issues), and recently restarted them 3 weeks ago.  At that time that she had not been taking her prescribed medications, she was taking her fianc's Jardiance.  She notes that she was taking 50 mg of Jardiance daily for the 2 months that she was out of her medications.  Her diabetes is well-controlled, but I suspect this is due to her taking her fianc's Jardiance.  We discussed discontinuing one of her diabetes medications, but the patient is very hesitant at this time.  Plan: - Continue Farxiga 5 mg daily - Continue Trulicity 0.75 mg/week - Recheck A1c in 3 months

## 2022-11-09 NOTE — Assessment & Plan Note (Signed)
Blood pressure was initially elevated to 141/96, but improved to 124/83 upon recheck.  The patient is on losartan-HCTZ 50-12.5 mg daily and amlodipine 5 mg daily.  No changes to blood pressure regimen at this time.

## 2022-11-09 NOTE — Assessment & Plan Note (Signed)
PHQ-9 is elevated at 26 today.  The patient's mood is very labile upon interview, and she states that her moods are not well-controlled at all.  She is extremely irritated and depressed and she states that she feels unstable. 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  She has been meeting with Marena Chancy regularly, and states that this has been helpful, but ultimately she wishes to increase her dose of effexor.   On exam, the patient's mood is very labile. She states that she hates all people and she is very frustrated with the world, citing many global injustices.  She does not have any thoughts of harming herself or others, but states that she wishes to take a sledgehammer and break something.  Despite that, the patient is very happy with the care that she received at the [facility], stating how much she loves all of the providers down here.  Plan: - Increase Effexor to 75 mg daily - Follow-up in 4 weeks to reassess mood/PHQ 9 - Continue to meet with Marena Chancy regularly

## 2022-11-09 NOTE — Assessment & Plan Note (Addendum)
At the conclusion of the patient's office visit, she states that she has had some itching in her groin/vagina recently.  She has tried over-the-counter remedies, but states that they have not helped.  She found an old prescription of Oxistat cream, and notes that this is the only medication that has helped her.  We discussed doing a vaginal swab to see if she has a true yeast infection at this time, but patient deferred this to a future visit. She may also have a fungal infection in her groin.   Plan: - Sent in Oxistat to use in groin region; advised to not use in vagina - Vaginal swab at next office visit if symptoms are persistent

## 2022-11-09 NOTE — Progress Notes (Signed)
CC: depression, diabetes, and htn  HPI:  [name] is a 42 y.o. female living with a history stated below and presents today for a 3360-month follow-up of her depression, hypertension, and diabetes. Please see problem based assessment and plan for additional details.  Past Medical History:  Diagnosis Date   Anxiety    Cancer    ovarian 2001 and 2010   Depression    Essential hypertension 05/04/2022   Hypertension    Migraine    Pityriasis rosea    Uncontrolled type 2 diabetes mellitus with hyperglycemia 05/04/2022   URI, acute 08/17/2022    Current Outpatient Medications on File Prior to Visit  Medication Sig Dispense Refill   Accu-Chek Softclix Lancets lancets Use to test blood sugar 4 (four) times daily. 100 each 0   amLODipine (NORVASC) 5 MG tablet Take 1 tablet (5 mg total) by mouth daily. 60 tablet 2   aspirin EC 81 MG tablet Take 1 tablet (81 mg total) by mouth daily. Swallow whole. 30 tablet 3   atorvastatin (LIPITOR) 80 MG tablet Take 1 tablet (80 mg total) by mouth daily. 90 tablet 3   Blood Glucose Monitoring Suppl (ACCU-CHEK GUIDE) w/Device KIT Use to test up to 4 (four) times daily as directed 1 kit 0   Continuous Blood Gluc Receiver (DEXCOM G7 RECEIVER) DEVI Use as directed 1 each 12   Continuous Blood Gluc Sensor (DEXCOM G7 SENSOR) MISC Use as directed 1 each 12   dapagliflozin propanediol (FARXIGA) 5 MG TABS tablet Take 1 tablet (5 mg total) by mouth daily before breakfast. 30 tablet 2   Dulaglutide (TRULICITY) 0.75 MG/0.5ML SOPN Inject 0.75 mg into the skin once a week. 2 mL 2   losartan-hydrochlorothiazide (HYZAAR) 50-12.5 MG tablet Take 1 tablet by mouth daily. 30 tablet 1   predniSONE (DELTASONE) 5 MG tablet Take 6 tabs daily for 2 days then 5 tabs daily x 2 days, then 4 tabs daily x 2 days, then 3 tabs daily x 2 days, then 2 tabs daily x 2 days then 1 tab daily x 2 days, then stop 42 tablet 0   Rimegepant Sulfate (NURTEC) 75 MG TBDP Take 1/2 tablet  (37.5 mg total) by mouth daily as needed 60 tablet 0   topiramate (TOPAMAX) 25 MG tablet Take 1 tablet (25 mg total) by mouth at bedtime. 30 tablet 3   No current facility-administered medications on file prior to visit.    Family History  Problem Relation Age of Onset   Hyperlipidemia Mother    Hypertension Mother    Diabetes Mother    Stroke Mother    Cerebral aneurysm Mother    Cerebral aneurysm Father    Cancer Father    Stroke Father    Breast cancer Maternal Aunt [id number]    Social History   Socioeconomic History   Marital status: Single    Spouse name: Not on file   Number of children: Not on file   Years of education: Not on file   Highest education level: Not on file  Occupational History   Not on file  Tobacco Use   Smoking status: Not on file   Smokeless tobacco: Never  Substance and Sexual Activity   Alcohol use: Yes    Alcohol/week: 14.0 standard drinks of alcohol    Types: 14 Shots of liquor per week   Drug use: No   Sexual activity: Not on file  Other Topics Concern   Not on file  Social  History Narrative   Not on file   Social Determinants of Health   Financial Resource Strain: Medium Risk (09/13/2022)   Overall Financial Resource Strain (CARDIA)    Difficulty of Paying Living Expenses: Somewhat hard  Food Insecurity: Food Insecurity Present (09/13/2022)   Hunger Vital Sign    Worried About Running Out of Food in the Last Year: Often true    Ran Out of Food in the Last Year: Often true  Transportation Needs: No Transportation Needs (09/13/2022)   PRAPARE - Administrator, Civil ServiceTransportation    Lack of Transportation (Medical): No    Lack of Transportation (Non-Medical): No  Physical Activity: Not on file  Stress: Stress Concern Present (09/13/2022)   [employer] - Occupational Stress Questionnaire    Feeling of Stress : Very much  Social Connections: Moderately Integrated (09/13/2022)   Social Connection and Isolation Panel [NHANES]    Frequency  of Communication with Friends and Family: Once a week    Frequency of Social Gatherings with Friends and Family: Once a week    Attends Religious Services: 1 to 4 times per year    Active Member of [employer]ubs or Organizations: No    Attends BankerClub or Organization Meetings: 1 to 4 times per year    Marital Status: Living with partner  Intimate Partner Violence: Not At Risk (09/13/2022)   Humiliation, Afraid, Rape, and Kick questionnaire    Fear of Current or Ex-Partner: No    Emotionally Abused: No    Physically Abused: No    Sexually Abused: No    Review of Systems: ROS negative except for what is noted on the assessment and plan.  Vitals:   11/09/22 0911 11/09/22 1002  BP: (!) 141/96 124/83  Pulse: (!) 101 75  Temp: 98 F (36.7 C)   TempSrc: Oral   SpO2: 100%   Weight: 203 lb 4.8 oz (92.2 kg)     Physical Exam: Constitutional: well-appearing female sitting in chair, in no acute distress Cardiovascular: regular rate and rhythm, no m/r/g Pulmonary/Chest: normal work of breathing on room air MSK: normal bulk and tone Neurological: alert & oriented x 3, no focal deficit Skin: warm and dry Psych: labile mood  Assessment & Plan:   Patient discussed with Dr. Criselda PeachesMullen  Essential hypertension Blood pressure was initially elevated to 141/96, but improved to 124/83 upon recheck.  The patient is on losartan-HCTZ 50-12.5 mg daily and amlodipine 5 mg daily.  No changes to blood pressure regimen at this time.  Uncontrolled type 2 diabetes mellitus with hyperglycemia (HCC) A1c is improved from 6.5% to 6% today.  The patient has been out of her Farxiga 5 mg and Trulicity 0.75 mg for about 2 months (insurance issues), and recently restarted them 3 weeks ago.  At that time that she had not been taking her prescribed medications, she was taking her fianc's Jardiance.  She notes that she was taking 50 mg of Jardiance daily for the 2 months that she was out of her medications.  Her diabetes is  well-controlled, but I suspect this is due to her taking her fianc's Jardiance.  We discussed discontinuing one of her diabetes medications, but the patient is very hesitant at this time.  Plan: - Continue Farxiga 5 mg daily - Continue Trulicity 0.75 mg/week - Recheck A1c in 3 months  Depression PHQ-9 is elevated at 26 today.  The patient's mood is very labile upon interview, and she states that her moods are not well-controlled at all.  She is extremely irritated and depressed and she states that she feels unstable. 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  She has been meeting with Marena Chancy regularly, and states that this has been helpful, but ultimately she wishes to increase her dose of effexor.   On exam, the patient's mood is very labile. She states that she hates all people and she is very frustrated with the world, citing many global injustices.  She does not have any thoughts of harming herself or others, but states that she wishes to take a sledgehammer and break something.  Despite that, the patient is very happy with the care that she received at the [facility], stating how much she loves all of the providers down here.  Plan: - Increase Effexor to 75 mg daily - Follow-up in 4 weeks to reassess mood/PHQ 9 - Continue to meet with Marena Chancy regularly  Yeast infection At the conclusion of the patient's office visit, she states that she has had some itching in her groin/vagina recently.  She has tried over-the-counter remedies, but states that they have not helped.  She found an old prescription of Oxistat cream, and notes that this is the only medication that has helped her.  We discussed doing a vaginal swab to see if she has a true yeast infection at this time, but patient deferred this to a future visit. She may also have a fungal infection in her groin.   Plan: - Sent in Oxistat to use in groin region; advised to  not use in vagina - Vaginal swab at next office visit if symptoms are persistent   Royalti Schauf, D.O. [facility], PGY-2 Phone: [phone] Date 11/09/2022 Time 10:23 AM

## 2022-11-12 ENCOUNTER — Encounter: Payer: Medicaid Other | Attending: Internal Medicine | Admitting: Registered"

## 2022-11-12 ENCOUNTER — Telehealth: Payer: Self-pay

## 2022-11-12 DIAGNOSIS — E119 Type 2 diabetes mellitus without complications: Secondary | ICD-10-CM | POA: Insufficient documentation

## 2022-11-12 NOTE — Patient Instructions (Addendum)
Great job on getting breakfast more often.  Continue using the Dexcom to see how food is affecting your blood sugar.  Think about trying the tips on the handout to get your bowels more regular.

## 2022-11-12 NOTE — Telephone Encounter (Signed)
Prior Authorization for patient (Oxistat 1% lotion) came through on cover my meds was submitted with last office notes awaiting approval or denial

## 2022-11-12 NOTE — Progress Notes (Signed)
Internal Medicine Clinic Attending ° °Case discussed with Dr. Atway  at the time of the visit.  We reviewed the resident’s history and exam and pertinent patient test results.  I agree with the assessment, diagnosis, and plan of care documented in the resident’s note.  °

## 2022-11-12 NOTE — Progress Notes (Signed)
Diabetes Self-Management Education  Visit Type: Follow-up  Appt. Start Time: 1150 Appt. End Time: 1224  11/12/2022  Regina Cantu, identified by name and date of birth, is a 42 y.o. female with a diagnosis of Diabetes: Type 2.   ASSESSMENT  Lab Results  Component Value Date   HGBA1C 6.0 (A) 11/09/2022    HGBA1C 6.5 (A) 08/16/2022   Relevant Medical History: s/p stroke 2023 DM Medications: Farxiga 5 mg, Trulicity 0.75 mg/week  Dexcom: patient shared data after last visit, but RD did not see because it went to junk mail. RD cannot get Clarity to sync pt data. Pt reports low blood sugar yesterday, thinks it was in the 40s. Discussed the increased risk of hypoglycemia with the medications that she is taking.    Pt reports learning from Dexcom what foods are doing to blood sugar and finding portion sizes that help control blood sugar. 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 But now limits rice intake to 1/2 c is okay. Pt finds that grits is okay..  Pt states yesterday CGM alarm went off and she needed sugar, ate miniature snickers. Pt states when she eats bad sugars it triggers diahrrea. Pt states she feels she needs to purge her system again by fasting because she is having gross burps.  Diet: Pt states she is doing better at not skipping breakfast, states she hshe really enjoys her go-to breakfast: Protein drink 2 scoops of protein powder with fruit, Banana, yogurt, [employer] granola bar.  Pt has a TikTok feed showing food she prepares. Pt states she enjoys cooking. Pt has a grill with chicken and pork chops, also showed pan meals, stews and soup looking with lots of vegetables.   Allergies: mushrooms, lactose intolerant   Diabetes Self-Management Education - 11/12/22 1300       Visit Information   Visit Type Follow-up      Initial Visit   Diabetes Type Type 2      Complications   Last HgB A1C per patient/outside source 6 %   was 6.5     Dietary  Intake   Breakfast Protein drink 2 scoops of protein powder with fruit, Banana, yogurt, [employer] granola bar.    Dinner chicken, potatoes, green beans      Subsequent Visit   Since your last visit have you continued or begun to take your medications as prescribed? Yes    Since your last visit, are you checking your blood glucose at least once a day? Yes   CGM           Individualized Plan for Diabetes Self-Management Training:   Learning Objective:  Patient will have a greater understanding of diabetes self-management. Patient education plan is to attend individual and/or group sessions per assessed needs and concerns.   Patient Instructions  Randie Heinz job on getting breakfast more often.  Continue using the Dexcom to see how food is affecting your blood sugar.  Think about trying the tips on the handout to get your bowels more regular.  Expected Outcomes:  Demonstrated interest in learning. Expect positive outcomes  Education material provided: Laxitive Abuse (given so she can try tips for staying regular)  If problems or questions, patient to contact team via:  Phone and MyChart  Future DSME appointment: 4-6 weeks

## 2022-11-12 NOTE — Telephone Encounter (Signed)
Decision:Denied The medication does not meet the [facility] preferred drug list (pdl) trail and failure criteria for the use of a non-preferred drug. To meet the criteria for approval, you must first try two of the following preferred drugs on the statewide preferred drug list (pdl) this if a list of covered drugs: Ciclopirox Cream,Clotrimazole Cream,Clotrimazole-Betamethasone Cream (generic for Lotrisone),Nystatin,Ketoconazole Cream/Shampoo

## 2022-11-14 ENCOUNTER — Other Ambulatory Visit (HOSPITAL_COMMUNITY): Payer: Self-pay

## 2022-11-19 ENCOUNTER — Other Ambulatory Visit: Payer: Self-pay

## 2022-11-19 ENCOUNTER — Other Ambulatory Visit (HOSPITAL_COMMUNITY): Payer: Self-pay

## 2022-11-20 ENCOUNTER — Other Ambulatory Visit (HOSPITAL_COMMUNITY): Payer: Self-pay

## 2022-11-26 ENCOUNTER — Other Ambulatory Visit (HOSPITAL_COMMUNITY): Payer: Self-pay

## 2022-11-26 DIAGNOSIS — E119 Type 2 diabetes mellitus without complications: Secondary | ICD-10-CM | POA: Diagnosis not present

## 2022-11-26 DIAGNOSIS — H3581 Retinal edema: Secondary | ICD-10-CM | POA: Diagnosis not present

## 2022-11-26 DIAGNOSIS — H1045 Other chronic allergic conjunctivitis: Secondary | ICD-10-CM | POA: Diagnosis not present

## 2022-11-26 DIAGNOSIS — H35033 Hypertensive retinopathy, bilateral: Secondary | ICD-10-CM | POA: Diagnosis not present

## 2022-11-26 DIAGNOSIS — H5213 Myopia, bilateral: Secondary | ICD-10-CM | POA: Diagnosis not present

## 2022-11-26 DIAGNOSIS — H2513 Age-related nuclear cataract, bilateral: Secondary | ICD-10-CM | POA: Diagnosis not present

## 2022-11-26 DIAGNOSIS — H3563 Retinal hemorrhage, bilateral: Secondary | ICD-10-CM | POA: Diagnosis not present

## 2022-11-26 LAB — HM DIABETES EYE EXAM

## 2022-11-27 ENCOUNTER — Other Ambulatory Visit (HOSPITAL_COMMUNITY): Payer: Self-pay

## 2022-11-27 ENCOUNTER — Other Ambulatory Visit: Payer: Self-pay

## 2022-11-29 ENCOUNTER — Emergency Department (HOSPITAL_COMMUNITY): Payer: Medicaid Other

## 2022-11-29 ENCOUNTER — Emergency Department (HOSPITAL_COMMUNITY)
Admission: EM | Admit: 2022-11-29 | Discharge: 2022-11-29 | Disposition: A | Discharge status: Home / Self Care | Payer: Medicaid Other | Attending: Emergency Medicine | Admitting: Emergency Medicine

## 2022-11-29 ENCOUNTER — Other Ambulatory Visit: Payer: Self-pay

## 2022-11-29 ENCOUNTER — Encounter (HOSPITAL_COMMUNITY): Payer: Self-pay | Admitting: Emergency Medicine

## 2022-11-29 DIAGNOSIS — Z7982 Long term (current) use of aspirin: Secondary | ICD-10-CM | POA: Diagnosis not present

## 2022-11-29 DIAGNOSIS — R531 Weakness: Secondary | ICD-10-CM | POA: Diagnosis not present

## 2022-11-29 DIAGNOSIS — R29818 Other symptoms and signs involving the nervous system: Secondary | ICD-10-CM | POA: Diagnosis not present

## 2022-11-29 DIAGNOSIS — F419 Anxiety disorder, unspecified: Secondary | ICD-10-CM | POA: Diagnosis not present

## 2022-11-29 DIAGNOSIS — R519 Headache, unspecified: Secondary | ICD-10-CM | POA: Diagnosis not present

## 2022-11-29 DIAGNOSIS — R079 Chest pain, unspecified: Secondary | ICD-10-CM | POA: Insufficient documentation

## 2022-11-29 DIAGNOSIS — Z91040 Latex allergy status: Secondary | ICD-10-CM | POA: Diagnosis not present

## 2022-11-29 DIAGNOSIS — I6529 Occlusion and stenosis of unspecified carotid artery: Secondary | ICD-10-CM | POA: Diagnosis not present

## 2022-11-29 DIAGNOSIS — Y900 Blood alcohol level of less than 20 mg/100 ml: Secondary | ICD-10-CM | POA: Insufficient documentation

## 2022-11-29 LAB — COMPREHENSIVE METABOLIC PANEL
ALT: 301 U/L — ABNORMAL HIGH (ref 0–44)
AST: 142 U/L — ABNORMAL HIGH (ref 15–41)
Albumin: 4 g/dL (ref 3.5–5.0)
Alkaline Phosphatase: 102 U/L (ref 38–126)
Anion gap: 11 (ref 5–15)
BUN: 9 mg/dL (ref 6–20)
CO2: 26 mmol/L (ref 22–32)
Calcium: 9 mg/dL (ref 8.9–10.3)
Chloride: 97 mmol/L — ABNORMAL LOW (ref 98–111)
Creatinine, Ser: 0.86 mg/dL (ref 0.44–1.00)
GFR, Estimated: >60 mL/min (ref >60)
Glucose, Bld: 128 mg/dL — ABNORMAL HIGH (ref 70–99)
Potassium: 3.6 mmol/L (ref 3.5–5.1)
Sodium: 134 mmol/L — ABNORMAL LOW (ref 135–145)
Total Bilirubin: 0.6 mg/dL (ref 0.3–1.2)
Total Protein: 7 g/dL (ref 6.5–8.1)

## 2022-11-29 LAB — URINALYSIS, MICROSCOPIC (REFLEX)

## 2022-11-29 LAB — I-STAT CHEM 8, ED
BUN: 11 mg/dL (ref 6–20)
Calcium, Ion: 1.18 mmol/L (ref 1.15–1.40)
Chloride: 97 mmol/L — ABNORMAL LOW (ref 98–111)
Creatinine, Ser: 0.7 mg/dL (ref 0.44–1.00)
Glucose, Bld: 126 mg/dL — ABNORMAL HIGH (ref 70–99)
HCT: 45 % (ref 36.0–46.0)
Hemoglobin: 15.3 g/dL — ABNORMAL HIGH (ref 12.0–15.0)
Potassium: 3.5 mmol/L (ref 3.5–5.1)
Sodium: 135 mmol/L (ref 135–145)
TCO2: 27 mmol/L (ref 22–32)

## 2022-11-29 LAB — RAPID URINE DRUG SCREEN, HOSP PERFORMED
Amphetamines: NOT DETECTED
Barbiturates: NOT DETECTED
Benzodiazepines: NOT DETECTED
Cocaine: NOT DETECTED
Opiates: NOT DETECTED
Tetrahydrocannabinol: POSITIVE — AB

## 2022-11-29 LAB — APTT: aPTT: 31 seconds (ref 24–36)

## 2022-11-29 LAB — DIFFERENTIAL
Abs Immature Granulocytes: 0.04 10*3/uL (ref 0.00–0.07)
Basophils Absolute: 0.1 10*3/uL (ref 0.0–0.1)
Basophils Relative: 1 %
Eosinophils Absolute: 0.2 10*3/uL (ref 0.0–0.5)
Eosinophils Relative: 2 %
Immature Granulocytes: 1 %
Lymphocytes Relative: 36 %
Lymphs Abs: 3.2 10*3/uL (ref 0.7–4.0)
Monocytes Absolute: 0.6 10*3/uL (ref 0.1–1.0)
Monocytes Relative: 6 %
Neutro Abs: 4.8 10*3/uL (ref 1.7–7.7)
Neutrophils Relative %: 54 %

## 2022-11-29 LAB — URINALYSIS, ROUTINE W REFLEX MICROSCOPIC
Bilirubin Urine: NEGATIVE
Glucose, UA: >=500 mg/dL — AB
Hgb urine dipstick: NEGATIVE
Ketones, ur: NEGATIVE mg/dL
Leukocytes,Ua: NEGATIVE
Nitrite: POSITIVE — AB
Protein, ur: NEGATIVE mg/dL
Specific Gravity, Urine: <1.005 — ABNORMAL LOW (ref 1.005–1.030)
pH: 6.5 (ref 5.0–8.0)

## 2022-11-29 LAB — I-STAT BETA HCG BLOOD, ED (MC, WL, AP ONLY): I-stat hCG, quantitative: <5 m[IU]/mL (ref <5)

## 2022-11-29 LAB — ETHANOL: Alcohol, Ethyl (B): <10 mg/dL (ref <10)

## 2022-11-29 LAB — CBC
HCT: 43.7 % (ref 36.0–46.0)
Hemoglobin: 15.1 g/dL — ABNORMAL HIGH (ref 12.0–15.0)
MCH: 30.1 pg (ref 26.0–34.0)
MCHC: 34.6 g/dL (ref 30.0–36.0)
MCV: 87.1 fL (ref 80.0–100.0)
Platelets: 377 10*3/uL (ref 150–400)
RBC: 5.02 MIL/uL (ref 3.87–5.11)
RDW: 12.4 % (ref 11.5–15.5)
WBC: 8.8 10*3/uL (ref 4.0–10.5)
nRBC: 0 % (ref 0.0–0.2)

## 2022-11-29 LAB — PROTIME-INR
INR: 1.1 (ref 0.8–1.2)
Prothrombin Time: 13.7 seconds (ref 11.4–15.2)

## 2022-11-29 MED ORDER — SODIUM CHLORIDE 0.9 % IV BOLUS
500.0000 mL | Freq: Once | INTRAVENOUS | Status: AC
  Administered 2022-11-29: 500 mL via INTRAVENOUS

## 2022-11-29 MED ORDER — SODIUM CHLORIDE 0.9 % IV SOLN
100.0000 mL/h | INTRAVENOUS | Status: DC
  Administered 2022-11-29: 100 mL/h via INTRAVENOUS

## 2022-11-29 MED ORDER — IOHEXOL 350 MG/ML SOLN
75.0000 mL | Freq: Once | INTRAVENOUS | Status: AC | PRN
  Administered 2022-11-29: 75 mL via INTRAVENOUS

## 2022-11-29 NOTE — Discharge Instructions (Addendum)
As discussed, your evaluation today has been largely reassuring.  But, it is important that you monitor your condition carefully, and do not hesitate to return to the ED if you develop new, or concerning changes in your condition. ? ?Otherwise, please follow-up with your physician for appropriate ongoing care. ? ?

## 2022-11-29 NOTE — ED Provider Notes (Signed)
[place] EMERGENCY DEPARTMENT AT [facility] Provider Note   CSN: [id number] Arrival date & time: 11/29/22  4098     History  Chief Complaint  Patient presents with   Weakness   Chest Pain   Stroke Symptoms    Regina Cantu is a 42 y.o. female.  HPI Patient presents with weakness, unsettled sensation.  Patient is here with her husband, though initially she is alone.  She notes that about 2 hours prior to ED arrival she suddenly felt anxious, with whole body weakness.  No focality, no syncope, no fall. No recent change in medication, diet, activity. Patient notes a history of prior stroke, prior diagnosis of aneurysm.  She reports that she has previously been told that she has a" time bomb" in her head, which she perseverates about. Prior to onset of symptoms today she was generally well.     Home Medications Prior to Admission medications   Medication Sig Start Date End Date Taking? Authorizing Provider  amLODipine (NORVASC) 5 MG tablet Take 1 tablet (5 mg total) by mouth daily. 09/13/22  Yes Mapp, Tavien, MD  aspirin EC 81 MG tablet Take 1 tablet (81 mg total) by mouth daily. Swallow whole. 05/05/22 12/09/22 Yes Osvaldo Shipper, MD  atorvastatin (LIPITOR) 80 MG tablet Take 1 tablet (80 mg total) by mouth daily. 10/29/22  Yes Atway, Rayann N, DO  dapagliflozin propanediol (FARXIGA) 5 MG TABS tablet Take 1 tablet (5 mg total) by mouth daily before breakfast. 10/18/22  Yes Atway, Rayann N, DO  Dulaglutide (TRULICITY) 0.75 MG/0.5ML SOPN Inject 0.75 mg into the skin once a week. 10/18/22  Yes Atway, Rayann N, DO  losartan-hydrochlorothiazide (HYZAAR) 50-12.5 MG tablet Take 1 tablet by mouth daily. 10/11/22  Yes Atway, Rayann N, DO  Rimegepant Sulfate (NURTEC) 75 MG TBDP Take 1/2 tablet (37.5 mg total) by mouth daily as needed 10/18/22  Yes Atway, Rayann N, DO  topiramate (TOPAMAX) 25 MG tablet Take 1 tablet (25 mg total) by mouth at bedtime. 10/31/22  Yes Atway, Rayann N, DO   venlafaxine XR (EFFEXOR-XR) 75 MG 24 hr capsule Take 1 capsule (75 mg total) by mouth daily with breakfast. 11/09/22  Yes Atway, Rayann N, DO  Continuous Glucose Receiver (DEXCOM G7 RECEIVER) DEVI Use as directed Patient not taking: Reported on 11/29/2022 09/13/22   Mapp, Gaylyn Cheers, MD  Continuous Glucose Sensor (DEXCOM G7 SENSOR) MISC Use as directed Patient not taking: Reported on 11/29/2022 09/13/22   Mapp, Gaylyn Cheers, MD  oxiconazole (OXISTAT) 1 % lotion Apply topically 2 (two) times daily. Patient not taking: Reported on 11/29/2022 11/09/22   Chauncey Mann, DO      Allergies    Bee venom, Mushroom extract complex, Wasp venom, Latex, and Penicillins    Review of Systems   Review of Systems  All other systems reviewed and are negative.   Physical Exam Updated Vital Signs BP 112/70   Pulse (!) 53   Temp 98.1 F (36.7 C) (Oral)   Resp 13   Ht 5\' 2"  (1.575 m)   Wt 92.6 kg   SpO2 98%   BMI 37.34 kg/m  Physical Exam Vitals and nursing note reviewed.  Constitutional:      General: She is not in acute distress.    Appearance: She is well-developed.  HENT:     Head: Normocephalic and atraumatic.  Eyes:     Conjunctiva/sclera: Conjunctivae normal.  Cardiovascular:     Rate and Rhythm: Normal rate and regular rhythm.  Pulmonary:     Effort: Pulmonary effort is normal. No respiratory distress.     Breath sounds: Normal breath sounds. No stridor.  Abdominal:     General: There is no distension.  Skin:    General: Skin is warm and dry.  Neurological:     General: No focal deficit present.     Mental Status: She is alert and oriented to person, place, and time.     Cranial Nerves: No cranial nerve deficit.     Motor: No weakness, tremor, atrophy or abnormal muscle tone.  Psychiatric:        Mood and Affect: Mood is anxious.     ED Results / Procedures / Treatments   Labs (all labs ordered are listed, but only abnormal results are displayed) Labs Reviewed  CBC - Abnormal; Notable  for the following components:      Result Value   Hemoglobin 15.1 (*)    All other components within normal limits  COMPREHENSIVE METABOLIC PANEL - Abnormal; Notable for the following components:   Sodium 134 (*)    Chloride 97 (*)    Glucose, Bld 128 (*)    AST 142 (*)    ALT 301 (*)    All other components within normal limits  RAPID URINE DRUG SCREEN, HOSP PERFORMED - Abnormal; Notable for the following components:   Tetrahydrocannabinol POSITIVE (*)    All other components within normal limits  URINALYSIS, ROUTINE W REFLEX MICROSCOPIC - Abnormal; Notable for the following components:   Specific Gravity, Urine <1.005 (*)    Glucose, UA >=500 (*)    Nitrite POSITIVE (*)    All other components within normal limits  URINALYSIS, MICROSCOPIC (REFLEX) - Abnormal; Notable for the following components:   Bacteria, UA MANY (*)    All other components within normal limits  I-STAT CHEM 8, ED - Abnormal; Notable for the following components:   Chloride 97 (*)    Glucose, Bld 126 (*)    Hemoglobin 15.3 (*)    All other components within normal limits  PROTIME-INR  APTT  DIFFERENTIAL  ETHANOL  I-STAT BETA HCG BLOOD, ED ([facility], [facility], [facility] ONLY)    EKG EKG Interpretation  Date/Time:  Thursday November 29 2022 09:40:47 EDT Ventricular Rate:  76 PR Interval:  148 QRS Duration: 88 QT Interval:  390 QTC Calculation: 438 R Axis:   69 Text Interpretation: Normal sinus rhythm Cannot rule out Anterior infarct , age undetermined Abnormal ECG Confirmed by Gerhard Munch [phone]) on 11/29/2022 2:47:44 PM  Radiology MR BRAIN WO CONTRAST  Result Date: 11/29/2022 CLINICAL DATA:  Neuro deficit, acute, stroke suspected EXAM: MRI HEAD WITHOUT CONTRAST TECHNIQUE: Multiplanar, multiecho pulse sequences of the brain and surrounding structures were obtained without intravenous contrast. COMPARISON:  CT head November 29, 2022.  MRI June 01, 2022. FINDINGS: Brain: No acute infarction, hemorrhage, hydrocephalus,  extra-axial collection or mass lesion. Remote perforator infarct in the right basal ganglia. Vascular: Major arterial flow voids are maintained at the skull base. Skull and upper cervical spine: Normal marrow signal. Sinuses/Orbits: Largely clear sinuses.  No acute orbital findings. Other: No mastoid effusions. IMPRESSION: No evidence of acute intracranial abnormality. Electronically Signed   By: Feliberto Harts M.D.   On: 11/29/2022 15:23   CT ANGIO HEAD NECK W WO CM  Result Date: 11/29/2022 CLINICAL DATA:  Cerebral aneurysm, untreated EXAM: CT ANGIOGRAPHY HEAD AND NECK WITH AND WITHOUT CONTRAST TECHNIQUE: Multidetector CT imaging of the head and neck was performed using the  standard protocol during bolus administration of intravenous contrast. Multiplanar CT image reconstructions and MIPs were obtained to evaluate the vascular anatomy. Carotid stenosis measurements (when applicable) are obtained utilizing NASCET criteria, using the distal internal carotid diameter as the denominator. RADIATION DOSE REDUCTION: This exam was performed according to the departmental dose-optimization program which includes automated exposure control, adjustment of the mA and/or kV according to patient size and/or use of iterative reconstruction technique. CONTRAST:  75mL OMNIPAQUE IOHEXOL 350 MG/ML SOLN COMPARISON:  Same day CT head.  CTA head/neck October 27, 23. FINDINGS: CTA NECK FINDINGS Aortic arch: Great vessel origins are patent out significant stenosis. Right carotid system: No evidence of dissection, stenosis (50% or greater), or occlusion. Left carotid system: No evidence of dissection, stenosis (50% or greater), or occlusion. Vertebral arteries: Left dominant. No evidence of dissection, stenosis (50% or greater), or occlusion. Skeleton: No acute findings on limited assessment. Other neck: No acute findings on limited assessment. Upper chest: Visualized lung apices are clear. Review of the MIP images confirms the above  findings CTA HEAD FINDINGS Anterior circulation: Similar atherosclerosis involving the intracranial ICAs without significant stenosis. Bilateral MCAs and bilateral ACAs are patent without proximal hemodynamically significant stenosis. Unchanged small posterior directed outpouching arising from the left supraclinoid ICA, favored to represent an infundibulum at the origin of a now occluded small left posterior communicating artery. Posterior circulation: Bilateral intradural vertebral arteries are patent. The right intradural vertebral artery is small in terminates as PICA, anatomic variant. Basilar artery and bilateral posterior cerebral arteries are patent without proximal hemodynamically significant stenosis. 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 Right fetal type PCA with small vertebrobasilar system, anatomic variant. Venous sinuses: As permitted by contrast timing, patent. Anatomic variants: Detailed above. Review of the MIP images confirms the above findings IMPRESSION: No large vessel occlusion or proximal hemodynamically significant stenosis. 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 Electronically Signed   By: Feliberto Harts M.D.   On: 11/29/2022 11:30   CT HEAD WO CONTRAST  Result Date: 11/29/2022 CLINICAL DATA:  Sudden headache. EXAM: CT HEAD WITHOUT CONTRAST TECHNIQUE: Contiguous axial images were obtained from the base of the skull through the vertex without intravenous contrast. RADIATION DOSE REDUCTION: This exam was performed according to the departmental dose-optimization program which includes automated exposure control, adjustment of the mA and/or kV according to patient size and/or use of iterative reconstruction technique. COMPARISON:  May 04, 2022 FINDINGS: Brain: No evidence of acute infarction, hemorrhage, hydrocephalus, extra-axial collection or mass lesion/mass effect. Vascular: No hyperdense vessel or unexpected calcification. Skull: Normal. Negative  for fracture or focal lesion. Sinuses/Orbits: No acute finding. Other: None IMPRESSION: No acute intracranial abnormality identified. Electronically Signed   By: Ted Mcalpine M.D.   On: 11/29/2022 10:28    Procedures Procedures    Medications Ordered in ED Medications  sodium chloride 0.9 % bolus 500 mL (0 mLs Intravenous Stopped 11/29/22 1239)    Followed by  0.9 %  sodium chloride infusion (100 mL/hr Intravenous New Bag/Given 11/29/22 1017)  iohexol (OMNIPAQUE) 350 MG/ML injection 75 mL (75 mLs Intravenous Contrast Given 11/29/22 1020)    ED Course/ Medical Decision Making/ A&P                             Medical Decision Making Presents with acute onset whole body weakness, shakiness, anxiousness. Patient's reported history of stroke, aneurysm is concerning for recurrence versus rupture. However, chart  review the patient's aneurysm is better described as an infundibulum.  Patient has been seen and evaluated by neurology before, and with concerns as above I discussed her case with them.  Patient will have CT, MRI, labs. Initial neuroexam reassuring.   Amount and/or Complexity of Data Reviewed Independent Historian: spouse External Data Reviewed: notes. Labs: ordered. Decision-making details documented in ED Course. Radiology: ordered and independent interpretation performed. Decision-making details documented in ED Course. ECG/medicine tests: ordered and independent interpretation performed. Decision-making details documented in ED Course.  Risk Prescription drug management.  Update: Initial CT, CTA, reassuring, similar to prior, no rupture, mass. MRI pending.  Cardiac 60 sinus normal Pulse ox 100% room air normal  In essence this adult female presents with concern for whole body weakness, anxiousness that began suddenly. She reports a history of aneurysm, but this does not seem to be case given prior imaging results and today's CT angiography.  Patient's neuroexam is  also reassuring as are her vital signs, and initial labs. Patient awaiting MRI, with unremarkable results patient can follow-up with her neurologist and primary care physician..  3:56 PM No distress, awake, alert, no ongoing complaints.  I discussed her case with our neurologist, given the patient's history, possible aneurysm which is not demonstrated to be the case on CT angiography with reassuring MRI, patient appropriate for outpatient follow-up.  No evidence for stroke, ruptured aneurysm, hemorrhage, distress, or other acute findings.        Final Clinical Impression(s) / ED Diagnoses Final diagnoses:  Weakness    Rx / DC Orders ED Discharge Orders     None         Gerhard Munch, MD 11/29/22 1557

## 2022-11-29 NOTE — ED Triage Notes (Addendum)
Pt. Stated, chest pain and then numbness. Pt no arm drift , not able to hold either leg up stated it was heavy. Able to lift both legs with Dr. Jeraldine Loots.  Husband here and stated she previous had an aneurysm 6 months ago.

## 2022-11-30 ENCOUNTER — Other Ambulatory Visit: Payer: Self-pay

## 2022-11-30 ENCOUNTER — Telehealth: Payer: Self-pay | Admitting: *Deleted

## 2022-11-30 NOTE — Transitions of Care (Post Inpatient/ED Visit) (Signed)
   11/30/2022  Name: Regina Cantu MRN: [id number] DOB: Jun 27, 1981  Today's TOC FU Call Status: Today's TOC FU Call Status:: Unsuccessul Call (1st Attempt) Unsuccessful Call (1st Attempt) Date: 11/30/22  Attempted to reach the patient regarding the most recent Inpatient/ED visit.  Follow Up Plan: Message was left that the Clinics had called and to call the Clinics at her convenience.  Signature Angelina Ok, RN 11/30/2022. 11:30 AM

## 2022-12-05 ENCOUNTER — Other Ambulatory Visit: Payer: Self-pay

## 2022-12-05 ENCOUNTER — Other Ambulatory Visit (HOSPITAL_COMMUNITY)
Admission: RE | Admit: 2022-12-05 | Discharge: 2022-12-05 | Disposition: A | Discharge status: Home / Self Care | Payer: Medicaid Other | Source: Ambulatory Visit | Attending: Internal Medicine | Admitting: Internal Medicine

## 2022-12-05 ENCOUNTER — Ambulatory Visit: Payer: Medicaid Other

## 2022-12-05 ENCOUNTER — Ambulatory Visit (INDEPENDENT_AMBULATORY_CARE_PROVIDER_SITE_OTHER): Payer: Medicaid Other | Admitting: Licensed Clinical Social Worker

## 2022-12-05 VITALS — BP 136/82 | HR 79 | Temp 97.6°F | Ht 62.0 in | Wt 199.7 lb

## 2022-12-05 DIAGNOSIS — Z794 Long term (current) use of insulin: Secondary | ICD-10-CM

## 2022-12-05 DIAGNOSIS — B379 Candidiasis, unspecified: Secondary | ICD-10-CM | POA: Diagnosis not present

## 2022-12-05 DIAGNOSIS — E1165 Type 2 diabetes mellitus with hyperglycemia: Secondary | ICD-10-CM | POA: Diagnosis not present

## 2022-12-05 DIAGNOSIS — Z124 Encounter for screening for malignant neoplasm of cervix: Secondary | ICD-10-CM | POA: Insufficient documentation

## 2022-12-05 DIAGNOSIS — Z7984 Long term (current) use of oral hypoglycemic drugs: Secondary | ICD-10-CM | POA: Diagnosis not present

## 2022-12-05 DIAGNOSIS — F330 Major depressive disorder, recurrent, mild: Secondary | ICD-10-CM

## 2022-12-05 NOTE — Patient Instructions (Signed)
Regina Cantu, it was a pleasure seeing you today! You endorsed feeling well today. Below are some of the things we talked about this visit. We look forward to seeing you in the follow up appointment!  Today we discussed: I will call with results and next steps  I have ordered the following labs today:  Lab Orders  No laboratory test(s) ordered today      Referrals ordered today:   Referral Orders  No referral(s) requested today     I have ordered the following medication/changed the following medications:   Stop the following medications: There are no discontinued medications.   Start the following medications: No orders of the defined types were placed in this encounter.    Follow-up: 2-3 months   Please make sure to arrive 15 minutes prior to your next appointment. If you arrive late, you may be asked to reschedule.   We look forward to seeing you next time. Please call our clinic at [phone] if you have any questions or concerns. The best time to call is Monday-Friday from 9am-4pm, but there is someone available 24/7. If after hours or the weekend, call the main hospital number and ask for the Internal Medicine Resident On-Call. If you need medication refills, please notify your pharmacy one week in advance and they will send [place] a request.  Thank you for letting us take part in your care. Wishing you the best!  Thank you, [name] MD

## 2022-12-05 NOTE — Assessment & Plan Note (Addendum)
See Dr. Imogene Burn note from earlier this month. A1c 6% then. She had been out of farxiga and trulicity for some time but is now back on them. She had been taking her fiance's jardiance up to 50mg  daily before. Now taking farxiga 5mg  daily and trulicity 0.75mg  weekly. A1c due for check in 2 months.

## 2022-12-05 NOTE — Assessment & Plan Note (Addendum)
Has itching, burning sensation when she wipes, tenderness around the vaginal. No urinary symptoms, no vaginal discharge. No rash in skin folds currently but sometimes flares. Of note, she had previously been taking high doses of jardiance as noted elsewhere.  GU exam supervised by Dr. Lafonda Mosses. No skin fold rash or external vaginal discharge or lesions. Labia are dry. Has creamy white drainage from cervix. -vaginal swab for BV, candida -pap smear today, also testing sample for g/c, trich -consider topical estrogen. She has had hysterectomy +BSO

## 2022-12-05 NOTE — Assessment & Plan Note (Addendum)
PHQ9 significantly elevated to 26 at last visit. Multiple stressors. Effexor increased to 75mg  daily. Feeling much better not on effexor 75 and has been meeting with bianca which helps. She feels much better.

## 2022-12-05 NOTE — BH Specialist Note (Signed)
Integrated Behavioral Health Follow Up In-Person Visit  MRN: [id number] Name: Regina Cantu  Number of Integrated Behavioral Health Clinician visits: Additional Visit  Session Start time: 1330   Session End time: 1431  Total time in minutes: 60   ypes of Service: Individual psychotherapy and General Behavioral Integrated Care (BHI)   Interpretor:No. Interpretor Name and Language: N/A   Subjective: Regina Cantu is a 42 y.o. female  Patient was referred by PCP for Anxiety/ Depression. Patient reports the following symptoms/concerns: Depression, irratablity Duration of problem: years; Severity of problem: severe   Objective: Mood: Irritable and Affect: Tearful Risk of harm to self or others: No plan to harm self or others   Patient and/or Family's Strengths/Protective Factors: Social connections and Social and Emotional competence   Goals Addressed: Patient will:  Reduce symptoms of: anxiety and depression      Progress towards Goals: Ongoing   Interventions: Interventions utilized:  Motivational Interviewing, Mindfulness or Relaxation Training, and Supportive Counseling Standardized Assessments completed: PHQ-SADS     09/13/2022   10:15 AM 09/03/2022    1:29 PM 09/03/2022    8:00 AM  PHQ-SADS Last 3 Score only  Total GAD-7 Score 17 20    PHQ Adolescent Score 23 16 1       Assessment: Patient currently experiencing Patient experiencing a lot of stress, irritability and sadness. Patient expressed her enjoyment in seeing [facility] and how its' positively impacting her . Patient continues to journal, listen to music and spending time with daughters. Patient may benefit from ongoing counseling, writing poetry and practicing self care.  [facility] informed patient of remaining visits allotted. [facility] recommended patient contact [facility]. [facility] educated patient and emailed link. Agency offers therapy and support groups free of cost.     Plan: Follow up with behavioral  health clinician on : June 1 at 1:30Pm in person   Christen Butter, MSW, LCSW-A She/Her Behavioral Health Clinician [facility]  Internal Medicine Center Direct Dial:[phone]  Fax [phone] Main Office Phone: [phone] [address] [place] [place], [place] [address] Website: [facility] Internal Medicine [facility]  [place], [place]  [facility]

## 2022-12-05 NOTE — Telephone Encounter (Signed)
Hey Dr.Atway, patient is here at her appointment today and she asked about her PA which was denied, can you send in a covered preferred medication listed below.

## 2022-12-05 NOTE — Progress Notes (Signed)
   CC: follow up mood, yeast infection  HPI:  [name] is a 42 y.o.-year-old female with past medical history as below presenting for follow up suspected yeast infection and depression.  Please see encounters tab for problem-based charting.  Past Medical History:  Diagnosis Date   Anxiety    Cancer (HCC)    ovarian 2001 and 2010   Depression    Essential hypertension 05/04/2022   Hypertension    Migraine    Pityriasis rosea    Uncontrolled type 2 diabetes mellitus with hyperglycemia (HCC) 05/04/2022   URI, acute 08/17/2022   Review of Systems: As in HPI.  Please see encounters tab for problem based charting.  Physical Exam:  Vitals:   12/05/22 1446  BP: 136/82  Pulse: 79  Temp: 97.6 F (36.4 C)  TempSrc: Oral  SpO2: 100%  Weight: 199 lb 11.2 oz (90.6 kg)  Height: 5\' 2"  (1.575 m)   General:Well-appearing, pleasant, In NAD Cardiac: RRR, no murmurs rubs or gallops. Respiratory: Normal work of breathing on room air, CTAB Abdominal: Soft, nontender, nondistended GU:No skin fold rash or external vaginal discharge or lesions. Labia are dry. Has creamy white drainage from cervix.   *supervised by Dr. Lafonda Mosses*  Assessment & Plan:   Depression PHQ9 significantly elevated to 26 at last visit. Multiple stressors. Effexor increased to 75mg  daily. Feeling much better not on effexor 75 and has been meeting with bianca which helps. She feels much better.  Yeast infection Has itching, burning sensation when she wipes, tenderness around the vaginal. No urinary symptoms, no vaginal discharge. No rash in skin folds currently but sometimes flares. Of note, she had previously been taking high doses of jardiance as noted elsewhere.  GU exam supervised by Dr. Lafonda Mosses. No skin fold rash or external vaginal discharge or lesions. Labia are dry. Has creamy white drainage from cervix. -vaginal swab for BV, candida -pap smear today, also testing sample for g/c, trich -consider  topical estrogen. She has had hysterectomy +BSO  Uncontrolled type 2 diabetes mellitus with hyperglycemia (HCC) See Dr. Imogene Burn note from earlier this month. A1c 6% then. She had been out of farxiga and trulicity for some time but is now back on them. She had been taking her fiance's jardiance up to 50mg  daily before. Now taking farxiga 5mg  daily and trulicity 0.75mg  weekly. A1c due for check in 2 months.   Patient seen with Dr.  Lafonda Mosses

## 2022-12-06 LAB — CERVICOVAGINAL ANCILLARY ONLY
Bacterial Vaginitis (gardnerella): NEGATIVE
Candida Glabrata: NEGATIVE
Candida Vaginitis: POSITIVE — AB
Comment: NEGATIVE
Comment: NEGATIVE
Comment: NEGATIVE

## 2022-12-06 NOTE — Addendum Note (Signed)
Addended by: Derrek Monaco on: 12/06/2022 09:29 AM   Modules accepted: Level of Service

## 2022-12-06 NOTE — Progress Notes (Signed)
Internal Medicine Clinic Attending  I saw and evaluated the patient.  I personally confirmed the key portions of the history and exam documented by Dr. Sridharan and I reviewed pertinent patient test results.  The assessment, diagnosis, and plan were formulated together and I agree with the documentation in the resident's note.  

## 2022-12-09 ENCOUNTER — Other Ambulatory Visit: Payer: Self-pay | Admitting: Internal Medicine

## 2022-12-09 DIAGNOSIS — I10 Essential (primary) hypertension: Secondary | ICD-10-CM

## 2022-12-10 ENCOUNTER — Other Ambulatory Visit (HOSPITAL_COMMUNITY): Payer: Self-pay

## 2022-12-10 ENCOUNTER — Telehealth: Payer: Self-pay | Admitting: Internal Medicine

## 2022-12-10 ENCOUNTER — Other Ambulatory Visit: Payer: Self-pay

## 2022-12-10 MED ORDER — FLUCONAZOLE 150 MG PO TABS
150.0000 mg | ORAL_TABLET | Freq: Every day | ORAL | 0 refills | Status: DC
  Filled 2022-12-10: qty 3, 3d supply, fill #0

## 2022-12-10 MED ORDER — METRONIDAZOLE 500 MG PO TABS
500.0000 mg | ORAL_TABLET | Freq: Two times a day (BID) | ORAL | 0 refills | Status: AC
  Filled 2022-12-10: qty 14, 7d supply, fill #0

## 2022-12-10 MED ORDER — METRONIDAZOLE 500 MG PO TABS
2000.0000 mg | ORAL_TABLET | Freq: Once | ORAL | 0 refills | Status: AC
  Filled 2022-12-10 (×2): qty 4, 1d supply, fill #0

## 2022-12-10 MED ORDER — LOSARTAN POTASSIUM-HCTZ 50-12.5 MG PO TABS
1.0000 | ORAL_TABLET | Freq: Every day | ORAL | 3 refills | Status: DC
Start: 2022-12-10 — End: 2023-04-15
  Filled 2022-12-10: qty 90, 90d supply, fill #0
  Filled 2023-03-10: qty 90, 90d supply, fill #1

## 2022-12-10 NOTE — Addendum Note (Signed)
Addended byLyndle Herrlich on: 12/10/2022 10:28 AM   Modules accepted: Orders

## 2022-12-10 NOTE — Telephone Encounter (Signed)
Pt

## 2022-12-10 NOTE — Telephone Encounter (Signed)
Pt requesting a call back. 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

## 2022-12-11 LAB — CYTOLOGY - PAP
Adequacy: ABSENT
Chlamydia: NEGATIVE
Comment: NEGATIVE
Comment: NEGATIVE
Comment: NEGATIVE
Comment: NEGATIVE
Comment: NEGATIVE
Comment: NORMAL
Diagnosis: NEGATIVE
HPV 16: NEGATIVE
HPV 18 / 45: NEGATIVE
High risk HPV: POSITIVE — AB
Neisseria Gonorrhea: NEGATIVE
Trichomonas: POSITIVE — AB

## 2022-12-17 ENCOUNTER — Encounter: Payer: Self-pay | Admitting: Dietician

## 2022-12-27 ENCOUNTER — Ambulatory Visit (INDEPENDENT_AMBULATORY_CARE_PROVIDER_SITE_OTHER): Payer: Medicaid Other | Admitting: Obstetrics and Gynecology

## 2022-12-27 ENCOUNTER — Other Ambulatory Visit: Payer: Self-pay

## 2022-12-27 ENCOUNTER — Other Ambulatory Visit (HOSPITAL_COMMUNITY): Payer: Self-pay

## 2022-12-27 ENCOUNTER — Encounter: Payer: Self-pay | Admitting: Obstetrics and Gynecology

## 2022-12-27 VITALS — BP 124/82 | HR 82 | Ht 62.0 in | Wt 195.3 lb

## 2022-12-27 DIAGNOSIS — R87619 Unspecified abnormal cytological findings in specimens from cervix uteri: Secondary | ICD-10-CM | POA: Insufficient documentation

## 2022-12-27 DIAGNOSIS — E1165 Type 2 diabetes mellitus with hyperglycemia: Secondary | ICD-10-CM

## 2022-12-27 DIAGNOSIS — B977 Papillomavirus as the cause of diseases classified elsewhere: Secondary | ICD-10-CM | POA: Diagnosis not present

## 2022-12-27 MED ORDER — DEXCOM G7 SENSOR MISC
3 refills | Status: DC
Start: 2022-12-27 — End: 2023-03-21
  Filled 2022-12-27: qty 9, 90d supply, fill #0
  Filled 2023-03-10 – 2023-03-20 (×2): qty 9, 90d supply, fill #1

## 2022-12-27 NOTE — Progress Notes (Signed)
Pt presents post hysterectomy.  Hx of ovarian cancer. Wants to consult to remove cervix.

## 2022-12-27 NOTE — Patient Instructions (Signed)
Below is our plan:  We will continue to monitor. Continue close follow up with PCP. Maintain good control of BP, LDL and A1C.   Please make sure you are staying well hydrated. I recommend 50-60 ounces daily. Well balanced diet and regular exercise encouraged. Consistent sleep schedule with 6-8 hours recommended.   Please continue follow up with care team as directed.   Follow up with me as needed   You may receive a survey regarding today's visit. I encourage you to leave honest feed back as I do use this information to improve patient care. Thank you for seeing me today!

## 2022-12-27 NOTE — Progress Notes (Signed)
Regina Cantu presents with her husband to review the results of her pap smear form IM on 5/24. H/O supracervical hysterectomy in 2010  Pap smear normal cytology. 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 Pt informed no medical indication for removal of cervix at this time Discussed ASCCP guidelines which recommend repeat pap smear with HPV co testing in 1 yr. HPV vaccine information provided to the pt as well. F/U in 1 yr for pap smear.

## 2022-12-27 NOTE — Telephone Encounter (Signed)
Needs 3 sensors per month.

## 2022-12-27 NOTE — Progress Notes (Signed)
Chief Complaint  Patient presents with   Follow-up    Pt in room 2. 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 Pt reports she has a hard time remembering things, has to take someone with her at the store. Pt still has migraines about every 30 days. Was in ER in April for weakness. Pt said she has face numbness at times and bilateral shoulder numbness.     HISTORY OF PRESENT ILLNESS:  01/02/23 ALL:  Regina Cantu is a 42 y.o. 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 She was seen in consult with Dr Pearlean Brownie 06/2022 and doing well. Since, she reports doing well. No new stroke like symptoms.   She was seen by Dr Nelly Laurence who did not feel ILR placement needed at this time. 30 day monitor unremarkable. Last A1C 6.0 in 11/2022. LDL was 91 09/2022. She continues atorvastatin 80mg  daily. She is followed regularly by [facility] health Internal Med. Dr Ned Card is PCP but sees multiple provider in office.   She was seen in ER 11/29/2022 for anxiety and generalized weakness. CT and exam reassuring. She was seen by SW with PCP office for depression.   She has an appt with Dr Frances Furbish 01/14/2023 for sleep evaluation.   HISTORY (copied from Dr Marlis Edelson previous note)  HPI: Regina Cantu is a pleasant 42 year old African-American lady seen today for initial office consultation visit for stroke.  History is obtained from the patient and review of electronic medical records.  I have personally reviewed pertinent available imaging films in PACS.  She has past medical history of diabetes, hypertension, migraines and ovarian cancer s/p hysterectomy who presented to emergency room on 05/05/2022 for evaluation for sudden onset of slurred speech and facial numbness and dysarthria which began in the morning at 1030 while she was at work.  She had trouble using her iPad speaking well.  She stated that her mouth and tongue felt numb and facial drooping.  Symptoms lasted about 30  minutes.  Scan of the brain showed small acute involving right basal ganglia and right centrum semiovale.  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  2D echo showed ejection fraction of 70 to 75% without cardiac source of embolism.  LDL cholesterol elevated 159 mg percent and hemoglobin A1c was 8.4.  Urine drug screen was positive for marijuana.  Patient was started on aspirin and Plavix for 3 weeks followed by aspirin alone.  She states her symptoms proved the next day and she was back to her baseline.  She is tolerating aspirin well without bruising or bleeding.  He states he is cut back as well as marijuana.  He is tolerating Lipitor well without muscle aches and pains.  States his sugars are also doing better.  He states blood pressure is doing fine but it is elevated in the office today at 170/85.  She appears to be at risk for obstructive sleep apnea but she has never been tested for it.  She was seen again in the emergency room on 06/01/2022 for evaluation for sudden onset of perioral numbness and right-sided weakness and numbness symptoms were felt to be having nonorganic features improved when she was distracted MRI of the brain was repeated and did not show any acute abnormality expected evolutionary changes in the right basal ganglia and corona radiata infarcts are noted.  Repeat CT  angiogram also shows no significant changes unchanged mild focal stenosis in the mid basilar artery and distal left P2 posterior cerebral artery   REVIEW OF SYSTEMS: Out of a complete 14 system review of symptoms, the patient complains only of the following symptoms, depression, and all other reviewed systems are negative.   ALLERGIES: Allergies  Allergen Reactions   Bee Venom Anaphylaxis   Mushroom Extract Complex Anaphylaxis   Wasp Venom Anaphylaxis   Latex Rash   Penicillins Hives, Nausea And Vomiting and Rash      HOME MEDICATIONS: Outpatient Medications Prior to Visit  Medication Sig Dispense Refill   amLODipine (NORVASC) 5 MG tablet Take 1 tablet (5 mg total) by mouth daily. 60 tablet 2   atorvastatin (LIPITOR) 80 MG tablet Take 1 tablet (80 mg total) by mouth daily. 90 tablet 3   Continuous Glucose Receiver (DEXCOM G7 RECEIVER) DEVI Use as directed 1 each 12   Continuous Glucose Sensor (DEXCOM G7 SENSOR) MISC Place new sensor every 10 days. Use to monitor your blood sugar continuously. 9 each 3   dapagliflozin propanediol (FARXIGA) 5 MG TABS tablet Take 1 tablet (5 mg total) by mouth daily before breakfast. 30 tablet 2   Dulaglutide (TRULICITY) 0.75 MG/0.5ML SOPN Inject 0.75 mg into the skin once a week. 2 mL 2   fluconazole (DIFLUCAN) 150 MG tablet Take 1 tablet (150 mg total) by mouth daily. 3 tablet 0   losartan-hydrochlorothiazide (HYZAAR) 50-12.5 MG tablet Take 1 tablet by mouth daily. 90 tablet 3   Rimegepant Sulfate (NURTEC) 75 MG TBDP Take 1/2 tablet (37.5 mg total) by mouth daily as needed 60 tablet 0   topiramate (TOPAMAX) 25 MG tablet Take 1 tablet (25 mg total) by mouth at bedtime. 30 tablet 3   venlafaxine XR (EFFEXOR-XR) 75 MG 24 hr capsule Take 1 capsule (75 mg total) by mouth daily with breakfast. 90 capsule 2   oxiconazole (OXISTAT) 1 % lotion Apply topically 2 (two) times daily. (Patient not taking: Reported on 01/02/2023) 30 mL 0   No facility-administered medications prior to visit.     PAST MEDICAL HISTORY: Past Medical History:  Diagnosis Date   Anxiety    Cancer (HCC)    ovarian 2001 and 2010   Depression    Essential hypertension 05/04/2022   Herniated disc, cervical 2011   Hypertension    Migraine    Pityriasis rosea    Uncontrolled type 2 diabetes mellitus with hyperglycemia (HCC) 05/04/2022   URI, acute 08/17/2022     PAST SURGICAL HISTORY: Past Surgical History:  Procedure Laterality Date   ABDOMINAL HYSTERECTOMY     OVARIAN CANCER       FAMILY  HISTORY: Family History  Problem Relation Age of Onset   Hyperlipidemia Mother    Hypertension Mother    Diabetes Mother    Stroke Mother    Cerebral aneurysm Mother    Cerebral aneurysm Father    Cancer Father    Stroke Father    Breast cancer Maternal Aunt 57     SOCIAL HISTORY: Social History   Socioeconomic History   Marital status: Single    Spouse name: Not on file   Number of children: Not on file   Years of education: Not on file   Highest education level: Not on file  Occupational History   Not on file  Tobacco Use   Smoking status: Every Day    Packs/day: 0.50    Years: 24.00    Additional pack years:  0.00    Total pack years: 12.00    Types: Cigarettes    Start date: 06/06/1998   Smokeless tobacco: Never  Substance and Sexual Activity   Alcohol use: Yes    Alcohol/week: 14.0 standard drinks of alcohol    Types: 14 Shots of liquor per week   Drug use: Yes    Types: Marijuana   Sexual activity: Not Currently    Partners: Male  Other Topics Concern   Not on file  Social History Narrative   Not on file   Social Determinants of Health   Financial Resource Strain: Medium Risk (09/13/2022)   Overall Financial Resource Strain (CARDIA)    Difficulty of Paying Living Expenses: Somewhat hard  Food Insecurity: Food Insecurity Present (09/13/2022)   Hunger Vital Sign    Worried About Running Out of Food in the Last Year: Often true    Ran Out of Food in the Last Year: Often true  Transportation Needs: No Transportation Needs (09/13/2022)   PRAPARE - Administrator, Civil Service (Medical): No    Lack of Transportation (Non-Medical): No  Physical Activity: Not on file  Stress: Stress Concern Present (09/13/2022)   [employer] of Occupational Health - Occupational Stress Questionnaire    Feeling of Stress : Very much  Social Connections: Moderately Integrated (09/13/2022)   Social Connection and Isolation Panel [NHANES]    Frequency of  Communication with Friends and Family: Once a week    Frequency of Social Gatherings with Friends and Family: Once a week    Attends Religious Services: 1 to 4 times per year    Active Member of [employer] or Organizations: No    Attends Banker Meetings: 1 to 4 times per year    Marital Status: Living with partner  Intimate Partner Violence: Not At Risk (09/13/2022)   Humiliation, Afraid, Rape, and Kick questionnaire    Fear of Current or Ex-Partner: No    Emotionally Abused: No    Physically Abused: No    Sexually Abused: No     PHYSICAL EXAM  Vitals:   01/02/23 1025  BP: 121/79  Pulse: 90  Weight: 190 lb 3.2 oz (86.3 kg)  Height: 5' 2.4" (1.585 m)   Body mass index is 34.34 kg/m.  Generalized: Well developed, in no acute distress  Cardiology: normal rate and rhythm, no murmur auscultated  Respiratory: clear to auscultation bilaterally    Neurological examination  Mentation: Alert oriented to time, place, history taking. Follows all commands speech and language fluent Cranial nerve II-XII: Pupils were equal round reactive to light. Extraocular movements were full, visual field were full on confrontational test. Facial sensation and strength were normal. Uvula tongue midline. Head turning and shoulder shrug  were normal and symmetric. Motor: The motor testing reveals 5 over 5 strength of all 4 extremities. Good symmetric motor tone is noted throughout.  Sensory: Sensory testing is intact to soft touch on all 4 extremities. No evidence of extinction is noted.  Coordination: Cerebellar testing reveals good finger-nose-finger and heel-to-shin bilaterally.  Gait and station: Gait is normal.    DIAGNOSTIC DATA (LABS, IMAGING, TESTING) - I reviewed patient records, labs, notes, testing and imaging myself where available.  Lab Results  Component Value Date   WBC 8.8 11/29/2022   HGB 15.3 (H) 11/29/2022   HCT 45.0 11/29/2022   MCV 87.1 11/29/2022   PLT 377 11/29/2022       Component Value Date/Time   NA 135 11/29/2022  1020   NA 138 07/04/2022 1130   K 3.5 11/29/2022 1020   CL 97 (L) 11/29/2022 1020   CO2 26 11/29/2022 0949   GLUCOSE 126 (H) 11/29/2022 1020   BUN 11 11/29/2022 1020   BUN 11 07/04/2022 1130   CREATININE 0.70 11/29/2022 1020   CALCIUM 9.0 11/29/2022 0949   PROT 7.0 11/29/2022 0949   ALBUMIN 4.0 11/29/2022 0949   AST 142 (H) 11/29/2022 0949   ALT 301 (H) 11/29/2022 0949   ALKPHOS 102 11/29/2022 0949   BILITOT 0.6 11/29/2022 0949   GFRNONAA >60 11/29/2022 0949   GFRAA >60 09/25/2018 0644   Lab Results  Component Value Date   CHOL 152 09/13/2022   HDL 40 09/13/2022   LDLCALC 91 09/13/2022   TRIG 116 09/13/2022   CHOLHDL 3.8 09/13/2022   Lab Results  Component Value Date   HGBA1C 6.0 (A) 11/09/2022   No results found for: "VITAMINB12" Lab Results  Component Value Date   TSH 0.588 01/13/2022        No data to display               No data to display           ASSESSMENT AND PLAN  42 y.o. year old female  has a past medical history of Anxiety, Cancer (HCC), Depression, Essential hypertension (05/04/2022), Herniated disc, cervical (2011), Hypertension, Migraine, Pityriasis rosea, Uncontrolled type 2 diabetes mellitus with hyperglycemia (HCC) (05/04/2022), and URI, acute (08/17/2022). here with    Infarction of right basal ganglia (HCC)  Type 2 diabetes mellitus without complication, without long-term current use of insulin (HCC)  Hyperlipidemia LDL goal <70  Morbid obesity (HCC)  Regina Cantu is doing well. We have reviewed history of CVA and importance of close follow up with her care team for stroke prevention. She will continue asa and atorvastatin per PCP. She was encouraged to maintain strict control of hypertension with blood pressure goal below 130/90, diabetes with hemoglobin A1c goal below 6.5% and lipids with LDL cholesterol goal below 70 mg/dL. I also advised the patient to eat a healthy  diet with plenty of whole grains, cereals, fruits and vegetables, exercise regularly and maintain ideal body weight. Smoking cessation encouraged. She will follow up with PCP as directed. She will return to see me as needed pending sleep eval with Dr Frances Furbish. She verbalizes understanding and agreement with this plan.    No orders of the defined types were placed in this encounter.    No orders of the defined types were placed in this encounter.    Shawnie Dapper, MSN, FNP-C 01/02/2023, 10:51 AM  [facility] [address], [address] [place], [place] [address] [phone]

## 2023-01-02 ENCOUNTER — Encounter: Payer: Self-pay | Admitting: Family Medicine

## 2023-01-02 ENCOUNTER — Ambulatory Visit: Payer: Medicaid Other | Admitting: Family Medicine

## 2023-01-02 VITALS — BP 121/79 | HR 90 | Ht 62.4 in | Wt 190.2 lb

## 2023-01-02 DIAGNOSIS — E119 Type 2 diabetes mellitus without complications: Secondary | ICD-10-CM | POA: Diagnosis not present

## 2023-01-02 DIAGNOSIS — I639 Cerebral infarction, unspecified: Secondary | ICD-10-CM

## 2023-01-02 DIAGNOSIS — E785 Hyperlipidemia, unspecified: Secondary | ICD-10-CM

## 2023-01-02 DIAGNOSIS — Z794 Long term (current) use of insulin: Secondary | ICD-10-CM

## 2023-01-04 ENCOUNTER — Telehealth: Payer: Self-pay | Admitting: *Deleted

## 2023-01-04 NOTE — Telephone Encounter (Signed)
Patient called in to discuss SO. States she is under "insane amount of stress" 2/2 SO's behaviors. She is sleeping ~ 2 hours per night, and has begun having urinary incontinence at night. States patient is not physically violent but verbally abusive to her, her adult children, and her friends. She is seeing Marena Chancy, and has been referred to [facility]. Next appt with Marena Chancy is 6/5. States she has numbers to [facility].

## 2023-01-05 ENCOUNTER — Telehealth: Payer: Medicaid Other | Admitting: Physician Assistant

## 2023-01-05 NOTE — Progress Notes (Signed)
Pt called to discuss her husbands health problems.  Advised pt to make an appointment for her husband, advised follow up with his PCP.   Christeen Douglas, PA-C

## 2023-01-07 ENCOUNTER — Other Ambulatory Visit (HOSPITAL_COMMUNITY): Payer: Self-pay

## 2023-01-09 ENCOUNTER — Ambulatory Visit (INDEPENDENT_AMBULATORY_CARE_PROVIDER_SITE_OTHER): Payer: Medicaid Other | Admitting: Licensed Clinical Social Worker

## 2023-01-09 DIAGNOSIS — H524 Presbyopia: Secondary | ICD-10-CM | POA: Diagnosis not present

## 2023-01-09 DIAGNOSIS — F330 Major depressive disorder, recurrent, mild: Secondary | ICD-10-CM

## 2023-01-09 NOTE — BH Specialist Note (Signed)
Integrated Behavioral Health Follow Up In-Person Visit  MRN: [id number] Name: [name]  Number of Integrated Behavioral Health Clinician visits: Additional Visit  Session Start time: 1330   Session End time: 1430  Total time in minutes: 60   Types of Service: Individual psychotherapy and General Behavioral Integrated Care (BHI)   Interpretor:No. Interpretor Name and Language: N/A   Subjective: Mikelle Rebel Langelier is a 42 y.o. female  Patient was referred by PCP for Anxiety/ Depression. Patient reports the following symptoms/concerns: Depression, irratablity Duration of problem: years; Severity of problem: severe   Objective: Mood: Irritable and Affect: Tearful Risk of harm to self or others: No plan to harm self or others   Patient and/or Family's Strengths/Protective Factors: Social connections and Social and Emotional competence   Goals Addressed: Patient will:  Reduce symptoms of: anxiety and depression      Progress towards Goals: Ongoing   Interventions: Interventions utilized:  Motivational Interviewing, Mindfulness or Management consultant, and Supportive Counseling    Assessment: Patient participating in [facility] Support groups weekly.    Patient continues to journal, listen to music and spending time with daughters. Patient may benefit from ongoing counseling, writing poetry and practicing self care.  Patient discussed current stress factors.       Plan: Follow up with behavioral health clinician on : July 24 th at 1:30 Pm in person   Christen Butter, MSW, LCSW-A She/Her Behavioral Health Clinician [facility]  Internal Medicine Center Direct [phone]  Fax [phone] Main Office Phone: [phone] [address] [place] [place], [place] [address] Website: [facility]  Primary Care  [place], [place]  [place]      Communications  View All Conversations on this Encounter

## 2023-01-14 ENCOUNTER — Encounter: Payer: Self-pay | Admitting: Neurology

## 2023-01-14 ENCOUNTER — Ambulatory Visit (INDEPENDENT_AMBULATORY_CARE_PROVIDER_SITE_OTHER): Payer: Medicaid Other | Admitting: Neurology

## 2023-01-14 VITALS — Ht 62.0 in | Wt 200.2 lb

## 2023-01-14 DIAGNOSIS — R519 Headache, unspecified: Secondary | ICD-10-CM | POA: Diagnosis not present

## 2023-01-14 DIAGNOSIS — R0683 Snoring: Secondary | ICD-10-CM | POA: Diagnosis not present

## 2023-01-14 DIAGNOSIS — Z9189 Other specified personal risk factors, not elsewhere classified: Secondary | ICD-10-CM | POA: Diagnosis not present

## 2023-01-14 DIAGNOSIS — R0681 Apnea, not elsewhere classified: Secondary | ICD-10-CM

## 2023-01-14 DIAGNOSIS — E669 Obesity, unspecified: Secondary | ICD-10-CM | POA: Diagnosis not present

## 2023-01-14 DIAGNOSIS — Z8673 Personal history of transient ischemic attack (TIA), and cerebral infarction without residual deficits: Secondary | ICD-10-CM | POA: Diagnosis not present

## 2023-01-14 NOTE — Progress Notes (Signed)
Subjective:    Patient ID: Regina Cantu is a 42 y.o. female.  HPI    Huston Foley, MD, PhD [facility] [address] [address] [place], [place] [address]  Dear Janalyn Shy,   I saw your patient, Regina Cantu, upon your kind request in my sleep clinic today for initial consultation of her sleep disorder, in particular, concern for underlying obstructive sleep apnea.  The patient is unaccompanied today.  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  Her Epworth sleepiness score is 3 out of 24, fatigue severity score is 36 out of 63.  I reviewed your office note from 06/20/2022.  He lives with her significant other and her 2 children, ages 70 and 84.  She does not currently work.  She does not have a set bedtime, may go to bed somewhere between 2 and 3 and rise time is around 6 currently.  She reports significant stress, what with her boyfriend's health and having to give him his medication around-the-clock.  She does not drink any daily caffeine or alcohol, she smokes less than half a pack per day and does endorse smoking marijuana regularly.  She reports that it helps her sleep and helps her appetite.  She is not sure about a family history of sleep apnea, she thinks maybe her mom and her grandmother may have sleep apnea.  She does not wake up rested.  She has recurrent morning headaches and also recurrent migraines.  She has no nightly nocturia but has had occasional nocturnal incontinence.  Her Past Medical History Is Significant For: Past Medical History:  Diagnosis Date   Anxiety    Cancer (HCC)    ovarian 2001 and 2010   Depression    Essential hypertension 05/04/2022   Herniated disc,  cervical 2011   Hypertension    Migraine    Pityriasis rosea    Uncontrolled type 2 diabetes mellitus with hyperglycemia (HCC) 05/04/2022   URI, acute 08/17/2022    Her Past Surgical History Is Significant For: Past Surgical History:  Procedure Laterality Date   ABDOMINAL HYSTERECTOMY     OVARIAN CANCER      Her Family History Is Significant For: Family History  Problem Relation Age of Onset   Hyperlipidemia Mother    Hypertension Mother    Diabetes Mother    Stroke Mother    Cerebral aneurysm Mother    Cerebral aneurysm Father    Cancer Father    Stroke Father    Breast cancer Maternal Aunt 65   Aneurysm Cousin    Heart Problems Cousin    Heart Problems Cousin    Heart Problems Cousin     Her Social History Is Significant For: Social History   Socioeconomic History   Marital status: Single    Spouse name: Not on file   Number of children: Not on file   Years of education: Not on file   Highest education level: Not on file  Occupational History   Not on file  Tobacco Use   Smoking status: Every Day    Packs/day: 0.50    Years: 24.00    Additional pack years: 0.00    Total pack years: 12.00    Types: Cigarettes    Start date: 06/06/1998  Smokeless tobacco: Never  Vaping Use   Vaping Use: Never used  Substance and Sexual Activity   Alcohol use: Not Currently    Comment: rare   Drug use: Yes    Types: Marijuana    Comment: uses to eat   Sexual activity: Not Currently    Partners: Male  Other Topics Concern   Not on file  Social History Narrative   Caffiene rare   Working:  not right now,  trying to get job   Lives with partner.   Social Determinants of Health   Financial Resource Strain: Medium Risk (09/13/2022)   Overall Financial Resource Strain (CARDIA)    Difficulty of Paying Living Expenses: Somewhat hard  Food Insecurity: Food Insecurity Present (09/13/2022)   Hunger Vital Sign    Worried About Running Out of Food in the Last Year: Often  true    Ran Out of Food in the Last Year: Often true  Transportation Needs: No Transportation Needs (09/13/2022)   PRAPARE - Administrator, Civil Service (Medical): No    Lack of Transportation (Non-Medical): No  Physical Activity: Not on file  Stress: Stress Concern Present (09/13/2022)   [employer] of Occupational Health - Occupational Stress Questionnaire    Feeling of Stress : Very much  Social Connections: Moderately Integrated (09/13/2022)   Social Connection and Isolation Panel [NHANES]    Frequency of Communication with Friends and Family: Once a week    Frequency of Social Gatherings with Friends and Family: Once a week    Attends Religious Services: 1 to 4 times per year    Active Member of [employer] or Organizations: No    Attends Engineer, structural: 1 to 4 times per year    Marital Status: Living with partner    Her Allergies Are:  Allergies  Allergen Reactions   Bee Venom Anaphylaxis   Mushroom Extract Complex Anaphylaxis   Wasp Venom Anaphylaxis   Latex Rash   Penicillins Hives, Nausea And Vomiting and Rash  :   Her Current Medications Are:  Outpatient Encounter Medications as of 01/14/2023  Medication Sig   amLODipine (NORVASC) 5 MG tablet Take 1 tablet (5 mg total) by mouth daily.   atorvastatin (LIPITOR) 80 MG tablet Take 1 tablet (80 mg total) by mouth daily.   Continuous Glucose Receiver (DEXCOM G7 RECEIVER) DEVI Use as directed   Continuous Glucose Sensor (DEXCOM G7 SENSOR) MISC Place new sensor every 10 days. Use to monitor your blood sugar continuously.   dapagliflozin propanediol (FARXIGA) 5 MG TABS tablet Take 1 tablet (5 mg total) by mouth daily before breakfast.   Dulaglutide (TRULICITY) 0.75 MG/0.5ML SOPN Inject 0.75 mg into the skin once a week.   losartan-hydrochlorothiazide (HYZAAR) 50-12.5 MG tablet Take 1 tablet by mouth daily.   Rimegepant Sulfate (NURTEC) 75 MG TBDP Take 1/2 tablet (37.5 mg total) by mouth daily as needed    topiramate (TOPAMAX) 25 MG tablet Take 1 tablet (25 mg total) by mouth at bedtime.   venlafaxine XR (EFFEXOR-XR) 75 MG 24 hr capsule Take 1 capsule (75 mg total) by mouth daily with breakfast.   oxiconazole (OXISTAT) 1 % lotion Apply topically 2 (two) times daily. (Patient not taking: Reported on 01/02/2023)   [DISCONTINUED] fluconazole (DIFLUCAN) 150 MG tablet Take 1 tablet (150 mg total) by mouth daily.   No facility-administered encounter medications on file as of 01/14/2023.  :   Review of Systems:  Out of a complete 14 point review  of systems, all are reviewed and negative with the exception of these symptoms as listed below:  Review of Systems  Neurological:        Sleep consult.  Snoring, witnessed apnea by partner. Hx stroke.  Has insomnia. Did not sleep all last night.  (Has a lot of stressors at  home).  ESS  3, FSS  36.      Objective:  Neurological Exam  Physical Exam Physical Examination:   Blood pressure 120/74 with a pulse of 77.  General Examination: The patient is a very pleasant 42 y.o. female in no acute distress. She appears well-developed and well-nourished and well groomed.  She is intermittently tearful as she talks about her stress at home.  HEENT: Normocephalic, atraumatic, pupils are equal, round and reactive to light, extraocular tracking is good without limitation to gaze excursion or nystagmus noted. Hearing is grossly intact. Face is symmetric with normal facial animation. Speech is clear with no dysarthria noted. There is no hypophonia. There is no lip, neck/head, jaw or voice tremor. Neck is supple with full range of passive and active motion. There are no carotid bruits on auscultation. Oropharynx exam reveals: mild mouth dryness, adequate dental hygiene and moderate airway crowding, due to small airway, thicker soft palate, Mallampati class III, tonsils not visualized.  Tongue protrudes centrally and palate elevates symmetrically.  Neck circumference 17-1/2  inches, minimal overbite noted.  Chest: Clear to auscultation without wheezing, rhonchi or crackles noted.  Heart: S1+S2+0, regular and normal without murmurs, rubs or gallops noted.   Abdomen: Soft, non-tender and non-distended.  Extremities: There is no pitting edema in the distal lower extremities bilaterally.   Skin: Warm and dry without trophic changes noted.   Musculoskeletal: exam reveals no obvious joint deformities.   Neurologically:  Mental status: The patient is awake, alert and oriented in all 4 spheres. Her immediate and remote memory, attention, language skills and fund of knowledge are appropriate. There is no evidence of aphasia, agnosia, apraxia or anomia. Speech is clear with normal prosody and enunciation. Thought process is linear. Mood is constricted, affect blunted.   Cranial nerves II - XII are as described above under HEENT exam.  Motor exam: Normal bulk, strength and tone is noted. There is no obvious action or resting tremor.  Fine motor skills and coordination: grossly intact.  Cerebellar testing: No dysmetria or intention tremor. There is no truncal or gait ataxia.  Sensory exam: intact to light touch in the upper and lower extremities.  Gait, station and balance: She stands easily. No veering to one side is noted. No leaning to one side is noted. Posture is age-appropriate and stance is narrow based. Gait shows normal stride length and normal pace. No problems turning are noted.   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  While a laboratory attended sleep study is typically considered "gold standard" for  evaluation of sleep disordered breathing, we mutually agreed to pursue a home sleep test at this time.   I had a long chat with the patient about my findings and the diagnosis of sleep apnea, particularly OSA, its prognosis and treatment options. We talked about medical/conservative treatments, surgical interventions and non-pharmacological approaches for symptom control.  I explained, in particular, the risks and ramifications of untreated moderate to severe OSA, especially with respect to developing cardiovascular disease down the road, including congestive heart failure (CHF), difficult to treat hypertension, cardiac arrhythmias (particularly A-fib), neurovascular complications including TIA, stroke and dementia. Even type 2 diabetes has, in part, been linked to untreated OSA. Symptoms of untreated OSA may include (but may not be limited to) daytime sleepiness, nocturia (i.e. frequent nighttime urination), memory problems, mood irritability and suboptimally controlled or worsening mood disorder such as depression and/or anxiety, lack of energy, lack of motivation, physical discomfort, as well as recurrent headaches, especially morning or nocturnal headaches. We talked about the importance of maintaining a healthy lifestyle and striving for healthy weight.  The importance of complete smoking cessation as well as marijuana cessation was also addressed.  In addition, she is advised to make a follow-up appointment with her primary care regarding stress management and treatment of depression and anxiety.  She reports that she is in counseling.  She also takes Effexor.   I outlined the differences between a laboratory attended sleep study which is considered more comprehensive and accurate over the option of a home sleep test (HST); the latter may lead to underestimation of sleep disordered breathing in some instances and does not help with diagnosing upper airway resistance syndrome and is not accurate enough to  diagnose primary central sleep apnea typically. I outlined possible surgical and non-surgical treatment options of OSA, including the use of a positive airway pressure (PAP) device (i.e. CPAP, AutoPAP/APAP or BiPAP in certain circumstances), a custom-made dental device (aka oral appliance, which would require a referral to a specialist dentist or orthodontist typically, and is generally speaking not considered for patients with full dentures or edentulous state), upper airway surgical options, such as traditional UPPP (which is not considered a first-line treatment) or the Inspire device (hypoglossal nerve stimulator, which would involve a referral for consultation with an ENT surgeon, after careful selection, following inclusion criteria - also not first-line treatment). I explained the PAP treatment option to the patient in detail, as this is generally considered first-line treatment.  The patient indicated that she would be willing to try PAP therapy, if the need arises. I explained the importance of being compliant with PAP treatment, not only for insurance purposes but primarily to improve patient's symptoms symptoms, and for the patient's long term health benefit, including to reduce Her cardiovascular risks longer-term.    We will pick up our discussion about the next steps and treatment options after testing.  We will keep her posted as to the test results by phone call and/or MyChart messaging where possible.  We will plan to follow-up in sleep clinic accordingly as well.  I answered all her questions today and the patient was in agreement.   I encouraged her to call with any interim questions, concerns, problems or updates or email [place] through MyChart.  Generally speaking, sleep test authorizations may take up to 2 weeks, sometimes less, sometimes longer, the patient is encouraged to get in touch with [place] if they do not hear back from the sleep lab staff directly within the next 2 weeks.  Thank you  very much for allowing me to participate in the care of this nice patient. If I can be of any further assistance to you please do not hesitate to call me at [phone].  Sincerely,   Huston Foley, MD, PhD

## 2023-01-14 NOTE — Patient Instructions (Addendum)
Thank you for choosing [facility] for your sleep related care! It was nice to meet you today!   Here is what we discussed today:   Please make an appointment with your primary care to discuss further stress management, and improving depressive symptoms and anxiety.     Based on your symptoms and your exam I believe you are at risk for obstructive sleep apnea (aka OSA). We should proceed with a sleep study to determine whether you do or do not have OSA and how severe it is. Even, if you have mild OSA, I may want you to consider treatment with CPAP, as treatment of even borderline or mild sleep apnea can result and improvement of symptoms such as sleep disruption, daytime sleepiness, nighttime bathroom breaks, restless leg symptoms, improvement of headache syndromes, even improved mood disorder.   As explained, an attended sleep study (meaning you get to stay overnight in the sleep lab), lets [place] monitor sleep-related behaviors such as sleep talking and leg movements in sleep, in addition to monitoring for sleep apnea.  A home sleep test is a screening tool for sleep apnea diagnosis only, but unfortunately, does not help with any other sleep-related diagnoses.  Please remember, the long-term risks and ramifications of untreated moderate to severe obstructive sleep apnea may include (but are not limited to): increased risk for cardiovascular disease, including congestive heart failure, stroke, difficult to control hypertension, treatment resistant obesity, arrhythmias, especially irregular heartbeat commonly known as A. Fib. (atrial fibrillation); even type 2 diabetes has been linked to untreated OSA.   Other correlations that untreated obstructive sleep apnea include macular edema which is swelling of the retina in the eyes, droopy eyelid syndrome, and elevated hemoglobin and hematocrit levels (often referred to as polycythemia).  Sleep apnea can cause disruption of sleep and sleep  deprivation in most cases, which, in turn, can cause recurrent headaches, problems with memory, mood, concentration, focus, and vigilance. Most people with untreated sleep apnea report excessive daytime sleepiness, which can affect their ability to drive. Please do not drive or use heavy equipment or machinery, if you feel sleepy! Patients with sleep apnea can also develop difficulty initiating and maintaining sleep (aka insomnia).   Having sleep apnea may increase your risk for other sleep disorders, including involuntary behaviors sleep such as sleep terrors, sleep talking, sleepwalking.    Having sleep apnea can also increase your risk for restless leg syndrome and leg movements at night.   Please note that untreated obstructive sleep apnea may carry additional perioperative morbidity. Patients with significant obstructive sleep apnea (typically, in the moderate to severe degree) should receive, if possible, perioperative PAP (positive airway pressure) therapy and the surgeons and particularly the anesthesiologists should be informed of the diagnosis and the severity of the sleep disordered breathing.   We will call you or email you through MyChart with regards to your test results and plan a follow-up in sleep clinic accordingly. Most likely, you will hear from one of our nurses.   Our sleep lab administrative assistant will call you to schedule your sleep study and give you further instructions, regarding the check in process for the sleep study, arrival time, what to bring, when you can expect to leave after the study, etc., and to answer any other logistical questions you may have. If you don't hear back from her by about 2 weeks from now, please feel free to call her direct line at [phone] or you can call our general clinic number, or  email [place] through My Chart.

## 2023-01-15 ENCOUNTER — Other Ambulatory Visit: Payer: Self-pay | Admitting: Internal Medicine

## 2023-01-15 MED ORDER — TRULICITY 0.75 MG/0.5ML ~~LOC~~ SOAJ
0.7500 mg | SUBCUTANEOUS | 2 refills | Status: DC
  Filled 2023-01-15 – 2023-01-17 (×3): qty 2, 28d supply, fill #0
  Filled 2023-02-06: qty 2, 28d supply, fill #1
  Filled 2023-03-10: qty 2, 28d supply, fill #2

## 2023-01-16 ENCOUNTER — Other Ambulatory Visit (HOSPITAL_COMMUNITY): Payer: Self-pay

## 2023-01-17 ENCOUNTER — Telehealth: Payer: Self-pay

## 2023-01-17 ENCOUNTER — Other Ambulatory Visit (HOSPITAL_COMMUNITY): Payer: Self-pay

## 2023-01-17 NOTE — Telephone Encounter (Signed)
The pharmacy department has reviewed the request submitted by Regina Cantu for the following medications.  Drug:Trulicity Subcutaneous Solution Pen- Injector 0.75MG /0.5ML  Quantity:6  Duration: 84 and has determined the following  Trulicity Subcutaneous Solution Prn-Injector 0.75MG /0.5ML is approved from 01/17/2023 to 01/17/2024. All strengths of the drug are approved   Approval has been faxed to the pharmacy.

## 2023-01-17 NOTE — Telephone Encounter (Signed)
Prior Authorization for patient ([employer]) came through on cover my meds was submitted with last office notes and labs awaiting approval or denial 

## 2023-01-18 ENCOUNTER — Other Ambulatory Visit (HOSPITAL_COMMUNITY): Payer: Self-pay

## 2023-01-23 ENCOUNTER — Telehealth: Payer: Self-pay | Admitting: Neurology

## 2023-01-23 NOTE — Telephone Encounter (Signed)
[facility] [employer] no auth req via fax form

## 2023-01-23 NOTE — Telephone Encounter (Signed)
[facility] [employer] pending faxed notes

## 2023-01-25 DIAGNOSIS — H93291 Other abnormal auditory perceptions, right ear: Secondary | ICD-10-CM | POA: Diagnosis not present

## 2023-01-25 DIAGNOSIS — Z8669 Personal history of other diseases of the nervous system and sense organs: Secondary | ICD-10-CM | POA: Diagnosis not present

## 2023-01-25 DIAGNOSIS — J3489 Other specified disorders of nose and nasal sinuses: Secondary | ICD-10-CM | POA: Diagnosis not present

## 2023-01-29 ENCOUNTER — Other Ambulatory Visit: Payer: Self-pay

## 2023-01-31 ENCOUNTER — Other Ambulatory Visit (HOSPITAL_COMMUNITY): Payer: Self-pay

## 2023-01-31 NOTE — Progress Notes (Signed)
Celso Amy, PA-C [address]  [address]  [place], [place] [address]  Main: [phone]  Fax: [phone]   Gastroenterology Consultation  Referring Provider:     Chauncey Mann, DO Primary Care Physician:  Chauncey Mann, DO Primary Gastroenterologist:  Celso Amy, PA-C  Reason for Consultation:     GERD, Abdominal Pain, digestive issues        HPI:   Nessa Amadita Stonebreaker is a 42 y.o. y/o female referred for consultation & management  by Chauncey Mann, DO.    GI symptoms: Patient reports constipation for several years, worse in the past few months.  She has bowel movement once every 2 weeks.  Denies rectal bleeding.  Occasionally has spot abdominal pain if she is constipated.  Denies unintentional weight loss or family history of colon cancer.  She also reports increased acid reflux with belching of acid.  Occasional abdominal bloating.  Denies nausea, vomiting, or dysphagia.  She is taking OTC probiotic and OTC stool softener.  No other treatment for her symptoms.  Labs: 11/29/2022 were significant for moderately elevated liver transaminases: AST 142, ALT 301.  Normal bilirubin 0.6, alk phos 102.  Normal CBC with hemoglobin 15.1, WBC 8.8, platelets 377.  Normal kidney function.  Glucose 128.  HCV Neg. 09/2022.  CT chest abdomen and pelvis with contrast 01/2022 showed hepatic steatosis and normal gallbladder.  Normal pancreas.  Normal intestines.  She had pneumonia which was treated.  No acute abnormality in the abdomen or pelvis.  Medical history significant for diabetes, hypertension, anxiety, depression, ovarian cancer in 2010 and 2001.  History of tobacco and marijuana use.  She reports previous history of moderate alcohol use.  She stopped all alcohol 11/2022.  No previous EGD, colonoscopy, or GI evaluation.   Past Medical History:  Diagnosis Date   Anxiety    Cancer (HCC)    ovarian 2001 and 2010   Depression    Essential hypertension 05/04/2022   Herniated  disc, cervical 2011   Hypertension    Migraine    Pityriasis rosea    Uncontrolled type 2 diabetes mellitus with hyperglycemia (HCC) 05/04/2022   URI, acute 08/17/2022    Past Surgical History:  Procedure Laterality Date   ABDOMINAL HYSTERECTOMY     OVARIAN CANCER      Prior to Admission medications   Medication Sig Start Date End Date Taking? Authorizing Provider  amLODipine (NORVASC) 5 MG tablet Take 1 tablet (5 mg total) by mouth daily. 09/13/22   Mapp, Gaylyn Cheers, MD  atorvastatin (LIPITOR) 80 MG tablet Take 1 tablet (80 mg total) by mouth daily. 10/29/22   Chauncey Mann, DO  Continuous Glucose Receiver (DEXCOM G7 RECEIVER) DEVI Use as directed 09/13/22   Mapp, Gaylyn Cheers, MD  Continuous Glucose Sensor (DEXCOM G7 SENSOR) MISC Place new sensor every 10 days. Use to monitor your blood sugar continuously. 12/27/22   Doran Stabler, DO  dapagliflozin propanediol (FARXIGA) 5 MG TABS tablet Take 1 tablet (5 mg total) by mouth daily before breakfast. 10/18/22   Atway, Rayann N, DO  Dulaglutide (TRULICITY) 0.75 MG/0.5ML SOPN Inject 0.75 mg into the skin once a week. 01/15/23   Atway, Rayann N, DO  losartan-hydrochlorothiazide (HYZAAR) 50-12.5 MG tablet Take 1 tablet by mouth daily. 12/10/22   Atway, Rayann N, DO  Rimegepant Sulfate (NURTEC) 75 MG TBDP Take 1/2 tablet (37.5 mg total) by mouth daily as needed 10/18/22   Atway, Rayann N, DO  topiramate (TOPAMAX) 25 MG tablet  Take 1 tablet (25 mg total) by mouth at bedtime. 10/31/22   Atway, Rayann N, DO  venlafaxine XR (EFFEXOR-XR) 75 MG 24 hr capsule Take 1 capsule (75 mg total) by mouth daily with breakfast. 11/09/22   Chauncey Mann, DO    Family History  Problem Relation Age of Onset   Hyperlipidemia Mother    Hypertension Mother    Diabetes Mother    Stroke Mother    Cerebral aneurysm Mother    Cerebral aneurysm Father    Cancer Father    Stroke Father    Breast cancer Maternal Aunt 44   Aneurysm Cousin    Heart Problems Cousin    Heart Problems  Cousin    Heart Problems Cousin      Social History   Tobacco Use   Smoking status: Every Day    Packs/day: 0.50    Years: 24.00    Additional pack years: 0.00    Total pack years: 12.00    Types: Cigarettes    Start date: 06/06/1998   Smokeless tobacco: Never  Vaping Use   Vaping Use: Never used  Substance Use Topics   Alcohol use: Not Currently    Comment: rare   Drug use: Yes    Types: Marijuana    Comment: uses to eat    Allergies as of 02/01/2023 - Review Complete 02/01/2023  Allergen Reaction Noted   Bee venom Anaphylaxis 06/01/2022   Mushroom extract complex Anaphylaxis 05/04/2022   Wasp venom Anaphylaxis 06/01/2022   Latex Rash 06/01/2022   Penicillins Hives, Nausea And Vomiting, and Rash 03/04/2013    Review of Systems:    All systems reviewed and negative except where noted in HPI.   Physical Exam:  BP 127/85   Pulse 94   Temp 98.4 F (36.9 C)   Ht 5' 2.25" (1.581 m)   Wt 195 lb 9.6 oz (88.7 kg)   BMI 35.49 kg/m  No LMP recorded. Patient has had a hysterectomy. Psych:  Alert and cooperative. Normal mood and affect. General:   Alert,  Well-developed, well-nourished, pleasant and cooperative in NAD Head:  Normocephalic and atraumatic. Eyes:  Sclera clear, no icterus.   Conjunctiva pink. Neck:  Supple; no masses or thyromegaly. Lungs:  Respirations even and unlabored.  Clear throughout to auscultation.   No wheezes, crackles, or rhonchi. No acute distress. Heart:  Regular rate and rhythm; no murmurs, clicks, rubs, or gallops. Abdomen:  Normal bowel sounds.  No bruits.  Soft, and non-distended without masses, hepatosplenomegaly or hernias noted.  No Tenderness.  No guarding or rebound tenderness.    Neurologic:  Alert and oriented x3;  grossly normal neurologically. Psych:  Alert and cooperative. Normal mood and affect.  Imaging Studies: No results found.  Assessment and Plan:   Mckaela Remedios Polich is a 42 y.o. y/o female has been referred for GERD  and Constipation.  Recent ER visit 11/2022 showed moderately elevated liver transaminases.  She has history of fatty liver seen on CT done in 2023.  She has stopped all alcohol since April.  1.  Elevated LFTs Labs: ANA, AMA, ASMA, ceruloplasmin, viral hepatitis A/B/C, iron panel, ferritin, immunoglobulins, alpha-1 antitrypsin, repeat hepatic panal.  RUQ abdominal ultrasound  Continue to avoid alcohol.  2.  Chronic constipation  Start Trulance 3 Mg once daily, samples and prescription given.  Add MiraLAX 1 capful once daily if needed.  Recommend high-fiber diet and 64 ounces of water/fluids daily.  3.  GERD  Start omeprazole 20  Mg 1 tablet once daily 30 minutes before meal. Recommend Lifestyle Modifications to prevent Acid Reflux.  Rec. Avoid coffee, sodas, peppermint, citrus fruits, and spicey foods.  Avoid eating 2-3 hours before bedtime.   4.  Fatty liver disease  Recommend a low-fat diet, regular exercise, and weight loss.   Follow up in 4 weeks with TG  Celso Amy, PA-C

## 2023-02-01 ENCOUNTER — Encounter: Payer: Self-pay | Admitting: Physician Assistant

## 2023-02-01 ENCOUNTER — Other Ambulatory Visit: Payer: Self-pay

## 2023-02-01 ENCOUNTER — Other Ambulatory Visit (HOSPITAL_COMMUNITY): Payer: Self-pay

## 2023-02-01 ENCOUNTER — Ambulatory Visit (INDEPENDENT_AMBULATORY_CARE_PROVIDER_SITE_OTHER): Payer: Medicaid Other | Admitting: Physician Assistant

## 2023-02-01 VITALS — BP 127/85 | HR 94 | Temp 98.4°F | Ht 62.25 in | Wt 195.6 lb

## 2023-02-01 DIAGNOSIS — K219 Gastro-esophageal reflux disease without esophagitis: Secondary | ICD-10-CM

## 2023-02-01 DIAGNOSIS — R7989 Other specified abnormal findings of blood chemistry: Secondary | ICD-10-CM | POA: Diagnosis not present

## 2023-02-01 DIAGNOSIS — K5904 Chronic idiopathic constipation: Secondary | ICD-10-CM | POA: Diagnosis not present

## 2023-02-01 DIAGNOSIS — K760 Fatty (change of) liver, not elsewhere classified: Secondary | ICD-10-CM

## 2023-02-01 MED ORDER — OMEPRAZOLE 20 MG PO CPDR
20.0000 mg | DELAYED_RELEASE_CAPSULE | Freq: Every day | ORAL | 3 refills | Status: DC
Start: 2023-02-01 — End: 2023-04-15
  Filled 2023-02-01: qty 90, 90d supply, fill #0

## 2023-02-01 MED ORDER — TRULANCE 3 MG PO TABS
3.0000 mg | ORAL_TABLET | Freq: Every day | ORAL | 3 refills | Status: DC
Start: 2023-02-01 — End: 2023-03-15
  Filled 2023-02-01 – 2023-03-10 (×3): qty 30, 30d supply, fill #0

## 2023-02-01 NOTE — Patient Instructions (Addendum)
Ultrasound scheduled 02/08/2023 @ 8 am.   Nothing to eat/drink after midnight.  Outpatient [facility] [address] [place] [address]

## 2023-02-02 LAB — HEPATITIS C ANTIBODY: Hep C Virus Ab: NONREACTIVE

## 2023-02-02 LAB — HEPATITIS B E ANTIBODY: Hep B E Ab: NONREACTIVE

## 2023-02-02 LAB — HEPATIC FUNCTION PANEL
AST: 19 IU/L (ref 0–40)
Total Protein: 6.8 g/dL (ref 6.0–8.5)

## 2023-02-02 LAB — IMMUNOGLOBULINS A/E/G/M, SERUM: IgG (Immunoglobin G), Serum: 1076 mg/dL (ref 586–1602)

## 2023-02-03 LAB — HEPATITIS A ANTIBODY, TOTAL: hep A Total Ab: POSITIVE — AB

## 2023-02-03 LAB — MITOCHONDRIAL/SMOOTH MUSCLE AB PNL: Mitochondrial Ab: <20 Units (ref 0.0–20.0)

## 2023-02-03 LAB — HEPATIC FUNCTION PANEL
Albumin: 4.4 g/dL (ref 3.9–4.9)
Bilirubin, Direct: <0.1 mg/dL (ref 0.00–0.40)

## 2023-02-03 LAB — IRON,TIBC AND FERRITIN PANEL
Total Iron Binding Capacity: 281 ug/dL (ref 250–450)
UIBC: 220 ug/dL (ref 131–425)

## 2023-02-03 LAB — ANTI-MICROSOMAL ANTIBODY LIVER / KIDNEY: LKM1 Ab: 0.8 Units (ref 0.0–20.0)

## 2023-02-03 LAB — ALPHA-1-ANTITRYPSIN: A-1 Antitrypsin: 163 mg/dL (ref 101–187)

## 2023-02-04 ENCOUNTER — Other Ambulatory Visit: Payer: Self-pay | Admitting: Internal Medicine

## 2023-02-04 ENCOUNTER — Other Ambulatory Visit (HOSPITAL_COMMUNITY): Payer: Self-pay

## 2023-02-04 LAB — HEPATITIS B SURFACE ANTIBODY,QUALITATIVE: Hep B Surface Ab, Qual: NONREACTIVE

## 2023-02-04 LAB — HEPATITIS B CORE ANTIBODY, TOTAL: Hep B Core Total Ab: NEGATIVE

## 2023-02-04 LAB — CK: Total CK: 54 U/L (ref 32–182)

## 2023-02-04 LAB — HEPATITIS B SURFACE ANTIGEN: Hepatitis B Surface Ag: NEGATIVE

## 2023-02-04 LAB — IRON,TIBC AND FERRITIN PANEL: Ferritin: 604 ng/mL — ABNORMAL HIGH (ref 15–150)

## 2023-02-04 LAB — CELIAC DISEASE AB SCREEN W/RFX
Antigliadin Abs, IgA: 3 units (ref 0–19)
Transglutaminase IgA: <2 U/mL (ref 0–3)

## 2023-02-04 NOTE — Telephone Encounter (Signed)
Next appt scheduled tomorrow 7/2 with Dr Sherrilee Gilles.

## 2023-02-05 ENCOUNTER — Other Ambulatory Visit (HOSPITAL_COMMUNITY): Payer: Self-pay

## 2023-02-05 ENCOUNTER — Other Ambulatory Visit: Payer: Self-pay

## 2023-02-05 ENCOUNTER — Ambulatory Visit: Payer: Medicaid Other | Admitting: Student

## 2023-02-05 ENCOUNTER — Encounter: Payer: Self-pay | Admitting: Student

## 2023-02-05 VITALS — BP 114/73 | HR 73 | Temp 98.0°F | Ht 62.25 in | Wt 198.6 lb

## 2023-02-05 DIAGNOSIS — Z794 Long term (current) use of insulin: Secondary | ICD-10-CM

## 2023-02-05 DIAGNOSIS — F330 Major depressive disorder, recurrent, mild: Secondary | ICD-10-CM

## 2023-02-05 DIAGNOSIS — E1165 Type 2 diabetes mellitus with hyperglycemia: Secondary | ICD-10-CM

## 2023-02-05 LAB — HEPATIC FUNCTION PANEL: Alkaline Phosphatase: 131 IU/L — ABNORMAL HIGH (ref 44–121)

## 2023-02-05 LAB — HEPATITIS B E ANTIGEN: Hep B E Ag: NEGATIVE

## 2023-02-05 MED ORDER — VENLAFAXINE HCL ER 150 MG PO CP24
150.0000 mg | ORAL_CAPSULE | Freq: Every day | ORAL | 1 refills | Status: DC
Start: 2023-02-05 — End: 2023-04-15
  Filled 2023-02-05 – 2023-03-01 (×2): qty 30, 30d supply, fill #0
  Filled 2023-03-26: qty 30, 30d supply, fill #1

## 2023-02-05 MED ORDER — DAPAGLIFLOZIN PROPANEDIOL 5 MG PO TABS
5.0000 mg | ORAL_TABLET | Freq: Every day | ORAL | 11 refills | Status: DC
Start: 2023-02-05 — End: 2023-03-05
  Filled 2023-02-05: qty 30, 30d supply, fill #0

## 2023-02-05 NOTE — Assessment & Plan Note (Addendum)
PHQ-9 today 23 from 26 at prior visit. Still has multiple stressors including finances, family and her overall health. She is taking Effexor 75 mg daily which was initially helping with her mood. States not as effective now. She is following with Christen Butter for [facility]. Denies much support at home. She was tearful during encounter today. Denies any SI or HI at this time.   Plan -Increase Effexor to 150 mg daily  -F/u visit in 4-6 weeks, titrate Effexor if needed -Continue f/u with Bianca (next appointment 7/24)

## 2023-02-05 NOTE — Progress Notes (Signed)
CC: T2DM f/u  HPI:  Regina Cantu is a 42 y.o. female living with a history stated below and presents today for T2DM f/u. Please see problem based assessment and plan for additional details.  Past Medical History:  Diagnosis Date   Anxiety    Cancer (HCC)    ovarian 2001 and 2010   Depression    Essential hypertension 05/04/2022   Herniated disc, cervical 2011   Hypertension    Migraine    Pityriasis rosea    Uncontrolled type 2 diabetes mellitus with hyperglycemia (HCC) 05/04/2022   URI, acute 08/17/2022    Current Outpatient Medications on File Prior to Visit  Medication Sig Dispense Refill   amLODipine (NORVASC) 5 MG tablet Take 1 tablet (5 mg total) by mouth daily. 60 tablet 2   aspirin EC 81 MG tablet Take 81 mg by mouth daily. Swallow whole.     atorvastatin (LIPITOR) 80 MG tablet Take 1 tablet (80 mg total) by mouth daily. 90 tablet 3   Continuous Glucose Receiver (DEXCOM G7 RECEIVER) DEVI Use as directed 1 each 12   Continuous Glucose Sensor (DEXCOM G7 SENSOR) MISC Place new sensor every 10 days. Use to monitor your blood sugar continuously. 9 each 3   Dulaglutide (TRULICITY) 0.75 MG/0.5ML SOPN Inject 0.75 mg into the skin once a week. 2 mL 2   losartan-hydrochlorothiazide (HYZAAR) 50-12.5 MG tablet Take 1 tablet by mouth daily. 90 tablet 3   omeprazole (PRILOSEC) 20 MG capsule Take 1 capsule (20 mg total) by mouth daily. 90 capsule 3   Plecanatide (TRULANCE) 3 MG TABS Take 1 tablet (3 mg total) by mouth daily. 30 tablet 3   Rimegepant Sulfate (NURTEC) 75 MG TBDP Take 1/2 tablet (37.5 mg total) by mouth daily as needed 60 tablet 0   topiramate (TOPAMAX) 25 MG tablet Take 1 tablet (25 mg total) by mouth at bedtime. 30 tablet 3   No current facility-administered medications on file prior to visit.   Review of Systems: ROS negative except for what is noted on the assessment and plan.  Vitals:   02/05/23 1034 02/05/23 1035  BP:  114/73  Pulse:  73  Temp:   98 F (36.7 C)  TempSrc:  Oral  SpO2:  100%  Weight: 198 lb 9.6 oz (90.1 kg)   Height: 5' 2.25" (1.581 m)    Physical Exam: Constitutional: alert, female sitting in chair comfortably, in no acute distress Cardiovascular: regular rate  Pulmonary/Chest: normal work of breathing on room air Neurological: alert & oriented x 3 Skin: warm and dry Psych: tearful at times during encounter, denies SI/HI   Assessment & Plan:   Depression PHQ-9 today 23 from 26 at prior visit. Still has multiple stressors including finances, family and her overall health. She is taking Effexor 75 mg daily which was initially helping with her mood. States not as effective now. She is following with Christen Butter for [facility]. Denies much support at home. She was tearful during encounter today. Denies any SI or HI at this time.   Plan -Increase Effexor to 150 mg daily  -F/u visit in 4-6 weeks, titrate Effexor if needed -Continue f/u with Marena Chancy (next appointment 7/24)  Uncontrolled type 2 diabetes mellitus with hyperglycemia (HCC) Last A1c was 6% in April and 6.5% before. She is taking Farxiga 5 mg daily and Trulicity 0.75 mg weekly. States fair adherence to both medications. No concerns of side effects at this time.   Plan -Refilled Farxiga 5 mg -  Continue Trulicity 0.75 mg weekly -Repeat A1c at next visit    Patient discussed with Dr. Halina Andreas, D.O. [facility] Internal Medicine, PGY-1 Phone: [phone] Date 02/05/2023 Time 8:30 PM

## 2023-02-05 NOTE — Assessment & Plan Note (Signed)
Last A1c was 6% in April and 6.5% before. She is taking Farxiga 5 mg daily and Trulicity 0.75 mg weekly. States fair adherence to both medications. No concerns of side effects at this time.   Plan -Refilled Farxiga 5 mg -Continue Trulicity 0.75 mg weekly -Repeat A1c at next visit

## 2023-02-05 NOTE — Patient Instructions (Addendum)
Thank you, [name] for allowing [place] to provide your care today. Today we discussed your diabetes and medication refills.    -I refilled your Marcelline Deist today. Please continue taking Farxiga and Trulicity for your diabetes. We can recheck A1c at your next visit. -I am sorry to hear about all the stress you are going through. Please continue seeing Marena Chancy and continue working with our team.  -I will increase your Effexor to 150 mg daily. -Plan to follow up office visit in 5-6 weeks.   I have ordered the following medication/changed the following medications:   Stop the following medications: Medications Discontinued During This Encounter  Medication Reason   venlafaxine XR (EFFEXOR-XR) 75 MG 24 hr capsule    dapagliflozin propanediol (FARXIGA) 5 MG TABS tablet Reorder     Start the following medications: Meds ordered this encounter  Medications   dapagliflozin propanediol (FARXIGA) 5 MG TABS tablet    Sig: Take 1 tablet (5 mg total) by mouth daily before breakfast.    Dispense:  30 tablet    Refill:  11   venlafaxine XR (EFFEXOR-XR) 150 MG 24 hr capsule    Sig: Take 1 capsule (150 mg total) by mouth daily with breakfast.    Dispense:  30 capsule    Refill:  1     Follow up:  5-6 weeks    Should you have any questions or concerns please call the internal medicine clinic at [phone].    Rana Snare, D.O. [facility]

## 2023-02-06 ENCOUNTER — Other Ambulatory Visit (HOSPITAL_COMMUNITY): Payer: Self-pay

## 2023-02-06 LAB — HEPATIC FUNCTION PANEL
ALT: 42 IU/L — ABNORMAL HIGH (ref 0–32)
Bilirubin Total: 0.3 mg/dL (ref 0.0–1.2)

## 2023-02-06 LAB — MITOCHONDRIAL/SMOOTH MUSCLE AB PNL: Smooth Muscle Ab: 5 Units (ref 0–19)

## 2023-02-06 LAB — IRON,TIBC AND FERRITIN PANEL
Iron Saturation: 22 % (ref 15–55)
Iron: 61 ug/dL (ref 27–159)

## 2023-02-06 LAB — HIV ANTIBODY (ROUTINE TESTING W REFLEX): HIV Screen 4th Generation wRfx: NONREACTIVE

## 2023-02-06 LAB — CERULOPLASMIN: Ceruloplasmin: 31.5 mg/dL (ref 19.0–39.0)

## 2023-02-06 LAB — IMMUNOGLOBULINS A/E/G/M, SERUM
IgA/Immunoglobulin A, Serum: 152 mg/dL (ref 87–352)
IgM (Immunoglobulin M), Srm: 68 mg/dL (ref 26–217)

## 2023-02-06 LAB — ANA: Anti Nuclear Antibody (ANA): NEGATIVE

## 2023-02-08 ENCOUNTER — Other Ambulatory Visit (HOSPITAL_COMMUNITY): Payer: Self-pay

## 2023-02-08 ENCOUNTER — Ambulatory Visit
Admission: RE | Admit: 2023-02-08 | Discharge: 2023-02-08 | Disposition: A | Discharge status: Home / Self Care | Payer: Medicaid Other | Source: Ambulatory Visit | Attending: Physician Assistant | Admitting: Physician Assistant

## 2023-02-08 DIAGNOSIS — R7989 Other specified abnormal findings of blood chemistry: Secondary | ICD-10-CM

## 2023-02-08 DIAGNOSIS — R109 Unspecified abdominal pain: Secondary | ICD-10-CM | POA: Diagnosis not present

## 2023-02-08 NOTE — Progress Notes (Signed)
Internal Medicine Clinic Attending  Case discussed with Dr. Zheng  At the time of the visit.  We reviewed the resident's history and exam and pertinent patient test results.  I agree with the assessment, diagnosis, and plan of care documented in the resident's note.  

## 2023-02-13 ENCOUNTER — Other Ambulatory Visit (HOSPITAL_COMMUNITY): Payer: Self-pay

## 2023-02-18 ENCOUNTER — Telehealth: Payer: Self-pay

## 2023-02-18 ENCOUNTER — Other Ambulatory Visit (HOSPITAL_COMMUNITY): Payer: Self-pay

## 2023-02-18 NOTE — Telephone Encounter (Signed)
Prior Authorization for patient (Nurtec) came through on cover my meds was submitted with last office notes awaiting approval or denial  KEY:[id number]

## 2023-02-19 ENCOUNTER — Other Ambulatory Visit (HOSPITAL_COMMUNITY): Payer: Self-pay

## 2023-02-19 NOTE — Telephone Encounter (Signed)
Decision:Denied more information has been placed in red team box Talmadge Chad (Key: [id number]) PA Case ID #: [id number] Rx #: [id number] Need Help? Call us at [phone] Outcome Denied on July 15 Denied Drug Nurtec 75MG  dispersible tablets ePA cloud logo Form [employer] Electronic Prior Authorization Form Original Claim Info 75

## 2023-02-20 ENCOUNTER — Other Ambulatory Visit (HOSPITAL_COMMUNITY): Payer: Self-pay

## 2023-02-21 NOTE — Telephone Encounter (Signed)
02/14/23 LVM was left  01/30/23 LVM AG 01/23/23 MCD [employer] no auth req via fax form EE

## 2023-02-22 ENCOUNTER — Other Ambulatory Visit (HOSPITAL_COMMUNITY): Payer: Self-pay

## 2023-02-22 ENCOUNTER — Telehealth: Payer: Self-pay

## 2023-02-22 NOTE — Telephone Encounter (Signed)
Pharmacy Patient Advocate Encounter   PA submitted to [employer] [facility] via CoverMyMeds Key/confirmation #/EOC [id number] Status is pending

## 2023-02-22 NOTE — Telephone Encounter (Signed)
Pharmacy Patient Advocate Encounter   Received notification from CoverMyMeds that prior authorization for [facility] is required/requested.   PA started via CoverMyMeds. KEY [id number] . Waiting for clinical questions to populate.

## 2023-02-26 DIAGNOSIS — H3581 Retinal edema: Secondary | ICD-10-CM | POA: Diagnosis not present

## 2023-02-26 DIAGNOSIS — H3563 Retinal hemorrhage, bilateral: Secondary | ICD-10-CM | POA: Diagnosis not present

## 2023-02-26 NOTE — Telephone Encounter (Signed)
Pharmacy Patient Advocate Encounter  Received notification from [facility] that Prior Authorization for Regina Cantu has been APPROVED from 02/22/23 to 02/22/24.Marland Kitchen

## 2023-02-27 ENCOUNTER — Ambulatory Visit (INDEPENDENT_AMBULATORY_CARE_PROVIDER_SITE_OTHER): Payer: Medicaid Other | Admitting: Licensed Clinical Social Worker

## 2023-02-27 DIAGNOSIS — F330 Major depressive disorder, recurrent, mild: Secondary | ICD-10-CM

## 2023-02-27 DIAGNOSIS — F419 Anxiety disorder, unspecified: Secondary | ICD-10-CM

## 2023-02-27 DIAGNOSIS — F329 Major depressive disorder, single episode, unspecified: Secondary | ICD-10-CM

## 2023-02-27 NOTE — BH Specialist Note (Signed)
Integrated Behavioral Health Follow Up In-Person Visit  MRN: [id number] Name: Regina Cantu  Number of Integrated Behavioral Health Clinician visits: 6-Sixth Visit  Session Start time: 1330   Session End time: 1430  Total time in minutes: 60   Types of Service: Individual psychotherapy and General Behavioral Integrated Care (BHI)   Interpretor:No. Interpretor Name and Language: N/A   Subjective: Regina Cantu is a 42 y.o. female  Patient was referred by PCP for Anxiety/ Depression. Patient reports the following symptoms/concerns: Patient reports relationship conflict with significant other. [facility] and patient discussed emotional regulation and identifying triggers. [facility] demonstrated technique. [facility] recommended family therapy.  Duration of problem: years; Severity of problem: severe   Objective: Mood: Appropriate and Affect: Appropriate Risk of harm to self or others: No plan to harm self or others   Patient and/or Family's Strengths/Protective Factors: Social connections and Social and Emotional competence   Goals Addressed: Patient will:  Reduce symptoms of: anxiety and depression      Progress towards Goals: Ongoing   Interventions: Interventions utilized:  Motivational Interviewing, Mindfulness or Management consultant, and Supportive Counseling     Assessment: Patient participating in [facility] Support groups weekly.     Patient continues to journal, listen to music and spending time with daughters. Patient may benefit from ongoing counseling, writing poetry and practicing self care.   Patient discussed current stress factors.        Plan: Follow up with behavioral health clinician on : August 28th at 1:30 Pm in person   Christen Butter, MSW, LCSW-A She/Her Behavioral Health Clinician [facility]  Internal Medicine Center Direct [phone]  Fax [phone] Main Office Phone: [phone] [address] [place] [place], [place]  [address] Website: [facility] Internal Medicine [facility]  [place], [place]  [place]

## 2023-02-28 ENCOUNTER — Other Ambulatory Visit (HOSPITAL_COMMUNITY): Payer: Self-pay

## 2023-03-01 ENCOUNTER — Other Ambulatory Visit (HOSPITAL_COMMUNITY): Payer: Self-pay

## 2023-03-01 NOTE — Telephone Encounter (Signed)
Appeal has been submitted with supporting documents has been faxed to [facility] [phone]

## 2023-03-03 ENCOUNTER — Other Ambulatory Visit: Payer: Self-pay | Admitting: Internal Medicine

## 2023-03-03 DIAGNOSIS — G43719 Chronic migraine without aura, intractable, without status migrainosus: Secondary | ICD-10-CM

## 2023-03-04 ENCOUNTER — Other Ambulatory Visit (HOSPITAL_COMMUNITY): Payer: Self-pay

## 2023-03-04 MED ORDER — TOPIRAMATE 25 MG PO TABS
25.0000 mg | ORAL_TABLET | Freq: Every day | ORAL | 3 refills | Status: DC
Start: 2023-03-04 — End: 2023-04-15
  Filled 2023-03-04: qty 30, 30d supply, fill #0
  Filled 2023-04-01: qty 30, 30d supply, fill #1

## 2023-03-05 ENCOUNTER — Other Ambulatory Visit: Payer: Self-pay

## 2023-03-05 ENCOUNTER — Other Ambulatory Visit (HOSPITAL_COMMUNITY): Payer: Self-pay

## 2023-03-05 MED ORDER — FARXIGA 5 MG PO TABS
5.0000 mg | ORAL_TABLET | Freq: Every day | ORAL | 11 refills | Status: DC
Start: 2023-03-05 — End: 2023-04-15
  Filled 2023-03-05: qty 30, 30d supply, fill #0
  Filled 2023-04-01: qty 30, 30d supply, fill #1

## 2023-03-05 MED ORDER — AMLODIPINE BESYLATE 5 MG PO TABS
5.0000 mg | ORAL_TABLET | Freq: Every day | ORAL | 3 refills | Status: DC
  Filled 2023-03-05: qty 90, 90d supply, fill #0

## 2023-03-05 NOTE — Addendum Note (Signed)
Addended by: Rana Snare on: 03/05/2023 01:38 PM   Modules accepted: Orders

## 2023-03-07 ENCOUNTER — Other Ambulatory Visit (HOSPITAL_COMMUNITY): Payer: Self-pay

## 2023-03-08 ENCOUNTER — Other Ambulatory Visit (HOSPITAL_COMMUNITY): Payer: Self-pay

## 2023-03-11 ENCOUNTER — Other Ambulatory Visit (HOSPITAL_COMMUNITY): Payer: Self-pay

## 2023-03-12 DIAGNOSIS — H5213 Myopia, bilateral: Secondary | ICD-10-CM | POA: Diagnosis not present

## 2023-03-13 ENCOUNTER — Other Ambulatory Visit (HOSPITAL_COMMUNITY): Payer: Self-pay

## 2023-03-13 ENCOUNTER — Other Ambulatory Visit: Payer: Self-pay

## 2023-03-15 ENCOUNTER — Telehealth: Payer: Self-pay

## 2023-03-15 ENCOUNTER — Other Ambulatory Visit (HOSPITAL_COMMUNITY): Payer: Self-pay

## 2023-03-15 MED ORDER — LUBIPROSTONE 24 MCG PO CAPS
24.0000 ug | ORAL_CAPSULE | Freq: Two times a day (BID) | ORAL | 2 refills | Status: DC
  Filled 2023-03-15: qty 60, 30d supply, fill #0

## 2023-03-15 NOTE — Telephone Encounter (Signed)
Called and patient verbalized understanding of denial. Sent medication to the pharmacy.

## 2023-03-15 NOTE — Telephone Encounter (Signed)
PA was submitted through cover my meds for Trulance 3mg . Insurance company denied the medication wanted patient to try Lubiprostone capsule or Linzess before they will cover this medication. If a appeal wants to be written a letter can be done and fax to [phone]

## 2023-03-18 ENCOUNTER — Other Ambulatory Visit: Payer: Self-pay | Admitting: Internal Medicine

## 2023-03-18 ENCOUNTER — Other Ambulatory Visit (HOSPITAL_COMMUNITY): Payer: Self-pay

## 2023-03-18 ENCOUNTER — Other Ambulatory Visit: Payer: Self-pay | Admitting: Student

## 2023-03-18 ENCOUNTER — Encounter: Payer: Self-pay | Admitting: Student

## 2023-03-18 ENCOUNTER — Other Ambulatory Visit: Payer: Self-pay

## 2023-03-18 ENCOUNTER — Ambulatory Visit: Payer: Medicaid Other | Admitting: Student

## 2023-03-18 VITALS — BP 130/76 | HR 80 | Ht 62.0 in | Wt 192.0 lb

## 2023-03-18 DIAGNOSIS — Z7984 Long term (current) use of oral hypoglycemic drugs: Secondary | ICD-10-CM | POA: Diagnosis not present

## 2023-03-18 DIAGNOSIS — Z7985 Long-term (current) use of injectable non-insulin antidiabetic drugs: Secondary | ICD-10-CM | POA: Diagnosis not present

## 2023-03-18 DIAGNOSIS — G8929 Other chronic pain: Secondary | ICD-10-CM

## 2023-03-18 DIAGNOSIS — E1165 Type 2 diabetes mellitus with hyperglycemia: Secondary | ICD-10-CM | POA: Diagnosis not present

## 2023-03-18 DIAGNOSIS — F32A Depression, unspecified: Secondary | ICD-10-CM | POA: Diagnosis not present

## 2023-03-18 DIAGNOSIS — T7840XA Allergy, unspecified, initial encounter: Secondary | ICD-10-CM

## 2023-03-18 DIAGNOSIS — Z91030 Bee allergy status: Secondary | ICD-10-CM | POA: Diagnosis not present

## 2023-03-18 DIAGNOSIS — M549 Dorsalgia, unspecified: Secondary | ICD-10-CM | POA: Insufficient documentation

## 2023-03-18 DIAGNOSIS — M5440 Lumbago with sciatica, unspecified side: Secondary | ICD-10-CM | POA: Diagnosis not present

## 2023-03-18 DIAGNOSIS — F330 Major depressive disorder, recurrent, mild: Secondary | ICD-10-CM

## 2023-03-18 MED ORDER — EPINEPHRINE 0.3 MG/0.3ML IJ SOAJ
0.3000 mg | INTRAMUSCULAR | 2 refills | Status: DC | PRN
  Filled 2023-03-18: qty 2, 30d supply, fill #0

## 2023-03-18 NOTE — Progress Notes (Unsigned)
Regina Amy, PA-C [address]  [address]  [place], [place] [address]  Main: [phone]  Fax: [phone]   Primary Care Physician: Chauncey Mann, DO  Primary Gastroenterologist:  Regina Amy, PA-C   CC: F/U Elevated LFTs, Constipation, GERD and Fatty Liver  HPI: Regina Cantu is a 42 y.o. female returns for 6 week f/u Elevated LFTs, Constipation, GERD and Fatty Liver.  She was started on Trulance 3mg  once daily which was not covered by insurance.  She was switched to Amitiza 24 mcg twice daily with benefit.  Also takes Miralax PRN,  and Omeprazole 20mg  every day.   Constipation has improved on Amitiza 24 twice daily.  Treatment.  Trulance worked well, however it was not covered by her insurance.  She is taking omeprazole 20 mg once daily with good control of acid reflux.  Denies breakthrough heartburn.  Her weight is down 5 pounds in the past 2 months.  She is working on diet, exercise, and weight loss.  No current GI symptoms or concerns.  Labs: 02/01/23 -mildly elevated alk phos 131, ALT 42.  Normal AST 19, total bilirubin 0.3.  LFTs greatly improved.  Normal iron, ceruloplasmin, immunoglobulins, alpha 1 antitrypsin.  Negative ANA, AMA, ASMA, and celiac.  Negative HIV, hepatitis C, and hepatitis B.  Not immune to hepatitis B.  Positive hepatitis A total antibody.  RUQ [place] 02/08/2023 -hepatic steatosis, no liver masses, normal gallbladder, CBD 3.1 mm.  Labs: 11/29/2022 were significant for moderately elevated liver transaminases: AST 142, ALT 301.  Normal bilirubin 0.6, alk phos 102.  Normal CBC with hemoglobin 15.1, WBC 8.8, platelets 377.  Normal kidney function.  Glucose 128.  HCV Neg. 09/2022.   CT chest abdomen and pelvis with contrast 01/2022 showed hepatic steatosis and normal gallbladder.  Normal pancreas.  Normal intestines.  She had pneumonia which was treated.  No acute abnormality in the abdomen or pelvis.   Medical history significant for diabetes, hypertension,  anxiety, depression, ovarian cancer in 2010 and 2001.  History of tobacco and marijuana use.  She stopped all alcohol 11/2022.   Current Outpatient Medications  Medication Sig Dispense Refill   amLODipine (NORVASC) 5 MG tablet Take 1 tablet (5 mg total) by mouth daily. 90 tablet 3   aspirin EC 81 MG tablet Take 81 mg by mouth daily. Swallow whole.     atorvastatin (LIPITOR) 80 MG tablet Take 1 tablet (80 mg total) by mouth daily. 90 tablet 3   Continuous Glucose Receiver (DEXCOM G7 RECEIVER) DEVI Use as directed 1 each 12   Continuous Glucose Sensor (DEXCOM G7 SENSOR) MISC Place new sensor every 10 days. Use to monitor your blood sugar continuously. 9 each 3   EPINEPHrine 0.3 mg/0.3 mL IJ SOAJ injection Inject 0.3 mg into the muscle as needed for anaphylaxis. 1 each 2   FARXIGA 5 MG TABS tablet Take 1 tablet (5 mg total) by mouth daily before breakfast. 30 tablet 11   losartan-hydrochlorothiazide (HYZAAR) 50-12.5 MG tablet Take 1 tablet by mouth daily. 90 tablet 3   omeprazole (PRILOSEC) 20 MG capsule Take 1 capsule (20 mg total) by mouth daily. 90 capsule 3   Rimegepant Sulfate (NURTEC) 75 MG TBDP Take 1/2 tablet (37.5 mg total) by mouth daily as needed 60 tablet 0   topiramate (TOPAMAX) 25 MG tablet Take 1 tablet (25 mg total) by mouth at bedtime. 30 tablet 3   venlafaxine XR (EFFEXOR-XR) 150 MG 24 hr capsule Take 1 capsule (150  mg total) by mouth daily with breakfast. 30 capsule 1   Dulaglutide (TRULICITY) 0.75 MG/0.5ML SOPN Inject 0.75 mg into the skin once a week. 2 mL 2   lubiprostone (AMITIZA) 24 MCG capsule Take 1 capsule (24 mcg total) by mouth 2 (two) times daily with a meal. 180 capsule 1   No current facility-administered medications for this visit.    Allergies as of 03/19/2023 - Review Complete 03/19/2023  Allergen Reaction Noted   Bee venom Anaphylaxis 06/01/2022   Mushroom extract complex Anaphylaxis 05/04/2022   Wasp venom Anaphylaxis 06/01/2022   Latex Rash 06/01/2022    Penicillins Hives, Nausea And Vomiting, and Rash 03/04/2013    Past Medical History:  Diagnosis Date   Anxiety    Cancer (HCC)    ovarian 2001 and 2010   Depression    Essential hypertension 05/04/2022   Herniated disc, cervical 2011   Hypertension    Migraine    Pityriasis rosea    Uncontrolled type 2 diabetes mellitus with hyperglycemia (HCC) 05/04/2022   URI, acute 08/17/2022    Past Surgical History:  Procedure Laterality Date   ABDOMINAL HYSTERECTOMY     OVARIAN CANCER      Review of Systems:    All systems reviewed and negative except where noted in HPI.   Physical Examination:   BP 122/76   Pulse 84   Temp 98.5 F (36.9 C)   Ht 5' 2.25" (1.581 m)   Wt 190 lb 6.4 oz (86.4 kg)   BMI 34.55 kg/m   General: Well-nourished, well-developed in no acute distress.  Psych: Alert and cooperative, normal mood and affect. No Exam today.  Imaging Studies: No results found.  Assessment and Plan:   Regina Cantu is a 42 y.o. y/o female 6-week follow-up elevated LFTs, chronic constipation, and GERD.  Sympotms Improved.  Elevated LFTs most likely due to hepatic steatosis and alcohol use.  Recently improved as she is avoiding alcohol.  Workup showed no evidence of autoimmune hepatitis, primary biliary cirrhosis, hemochromatosis, Wilson's disease, alpha 1 antitrypsin deficiency, or acute viral hepatitis.  She is not immune to hepatitis B.  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    1.  Elevated LFTs  Labs: Hepatic Panal, Hepatitis A IgM lab   Give hepatitis B vaccine  Continue to avoid alcohol and hepatotoxic medication  2.  Hepatic steatosis  Continue low-fat diet, exercise, weight loss  3.  Chronic constipation - Improved on Amitiza.  Continue amitiza (Lubiprostone) twice daily with food, #180, 1 RF.  4.  GERD -improved and controlled on omeprazole.  Continue omeprazole 20 Mg daily  5.  Colon cancer  screening  Guidelines discussed.  First screening colonoscopy will be due February 2025, at age 25.   Regina Amy, PA-C  Follow up in 6 months with TG.  Plan to schedule colonoscopy with 2-day prep in 6 months.

## 2023-03-18 NOTE — Patient Instructions (Signed)
Thank you, Ms.Kushi Omer Jack for allowing [place] to provide your care today. Today we discussed your medications for depression and diabetes, and your back pain.   I have ordered the following labs for you:  Lab Orders         CMP14 + Anion Gap         Hemoglobin A1C      Tests ordered today:   Referrals ordered today:   Referral Orders         Ambulatory referral to Orthopedic Surgery      I have ordered the following medication/changed the following medications:   Stop the following medications: There are no discontinued medications.   Start the following medications: No orders of the defined types were placed in this encounter.    Follow up:  4 weeks     Remember:   - I will follow up with your lab results for A1C and a complete metabolic panel.   - You have been referred to Dr. Kathi Der office, they will call you to follow up with an appointment so you can discuss your back pain.   - Please let us know if you have any insurance issues with any of your medications or need any refills.   - Please try to eat 3 small meals a day and we will check your glucose levels from your monitor at your follow-up visit to determine if we need to make any medication changes.   Should you have any questions or concerns please call the internal medicine clinic at [phone].      Colbert Coyer, MD PGY-1 Internal Medicine Teaching Progam [facility]

## 2023-03-18 NOTE — Assessment & Plan Note (Addendum)
Patient with allergies that include bee and wasp venom. Patient requested Epi pen refill today, ordered and sent to preferred pharmacy.

## 2023-03-18 NOTE — Assessment & Plan Note (Signed)
Patient is taking Farxiga 5 mg and Trulicity 0.75 mg daily. Last A1C from April 2024 6%. Dexcom glucose readings with average glucose of 117 and one or two episodes of hypoglycemia that Regina Cantu recovered from with food. Recommended eating 3 small, regular meals and packing a lunch or snacks since healthier options aren't always available while Regina Cantu's at work. Patient will also be in touch with office regarding how we can be of help to ensure her insurance is paying for her diabetes medications.  Plan:  - Repeat A1C and CMP today, follow up as needed
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
## 2023-03-18 NOTE — Assessment & Plan Note (Signed)
Patient with chronic lower back history that followed a fall. Pain management is difficult given her avoidance of analgesics that may interact with her migraine medications (topiramate, rimegepant), and patient wants to avoid rebound headaches. Patient feels some tingling, which may be sciatica, with certain leg movements. No point tenderness on palpation. Lower midline back pain worsens with prolonged sitting. Bilateral lower extremity strength 5/5 and sensation intact. Has had some urinary incontinence but patient reports this is from birthing children and has discussed this issue with her GI/GU specialist. She is currently taking lubriprostone for loose stools/IBS. One of her sons has seen Dr. Christell Constant with orthopedic surgery and patient is interested in an evaluation. Will follow up on referral at next visit and discuss whether PT may also be helpful if patient's work schedule allows it.   Plan:  - Referral placed for Dr. Christell Constant with orthopedic surgery - Provided patient with RICE strategies for rest, ice, compression, and elevation  - Follow up on urinary incontinence management at next visit as patient wasn't sure of medication recommendations that were provided for her
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
## 2023-03-19 ENCOUNTER — Ambulatory Visit: Payer: Medicaid Other | Admitting: Physician Assistant

## 2023-03-19 ENCOUNTER — Encounter: Payer: Self-pay | Admitting: Physician Assistant

## 2023-03-19 ENCOUNTER — Other Ambulatory Visit (HOSPITAL_COMMUNITY): Payer: Self-pay

## 2023-03-19 VITALS — BP 122/76 | HR 84 | Temp 98.5°F | Ht 62.25 in | Wt 190.4 lb

## 2023-03-19 DIAGNOSIS — K5904 Chronic idiopathic constipation: Secondary | ICD-10-CM

## 2023-03-19 DIAGNOSIS — K760 Fatty (change of) liver, not elsewhere classified: Secondary | ICD-10-CM

## 2023-03-19 DIAGNOSIS — Z1211 Encounter for screening for malignant neoplasm of colon: Secondary | ICD-10-CM

## 2023-03-19 DIAGNOSIS — R7989 Other specified abnormal findings of blood chemistry: Secondary | ICD-10-CM

## 2023-03-19 DIAGNOSIS — K219 Gastro-esophageal reflux disease without esophagitis: Secondary | ICD-10-CM | POA: Diagnosis not present

## 2023-03-19 MED ORDER — TRULICITY 0.75 MG/0.5ML ~~LOC~~ SOAJ
0.7500 mg | SUBCUTANEOUS | 2 refills | Status: DC
  Filled 2023-04-01: qty 2, 28d supply, fill #0
  Filled ????-??-??: fill #0

## 2023-03-19 MED ORDER — LUBIPROSTONE 24 MCG PO CAPS
24.0000 ug | ORAL_CAPSULE | Freq: Two times a day (BID) | ORAL | 1 refills | Status: DC
Start: 2023-03-19 — End: 2023-04-15
  Filled 2023-03-19 – 2023-03-20 (×2): qty 180, 90d supply, fill #0

## 2023-03-19 NOTE — Patient Instructions (Signed)
You will need to come back one month from today to receive your next Hep B vaccine. It is a series of three.

## 2023-03-20 ENCOUNTER — Telehealth: Payer: Self-pay

## 2023-03-20 ENCOUNTER — Other Ambulatory Visit (HOSPITAL_COMMUNITY): Payer: Self-pay

## 2023-03-20 ENCOUNTER — Other Ambulatory Visit: Payer: Self-pay

## 2023-03-20 ENCOUNTER — Encounter: Payer: Self-pay | Admitting: Neurology

## 2023-03-20 ENCOUNTER — Other Ambulatory Visit: Payer: Self-pay | Admitting: Student

## 2023-03-20 ENCOUNTER — Other Ambulatory Visit (HOSPITAL_BASED_OUTPATIENT_CLINIC_OR_DEPARTMENT_OTHER): Payer: Self-pay

## 2023-03-20 DIAGNOSIS — R0681 Apnea, not elsewhere classified: Secondary | ICD-10-CM

## 2023-03-20 DIAGNOSIS — Z8673 Personal history of transient ischemic attack (TIA), and cerebral infarction without residual deficits: Secondary | ICD-10-CM

## 2023-03-20 DIAGNOSIS — E669 Obesity, unspecified: Secondary | ICD-10-CM

## 2023-03-20 DIAGNOSIS — E1165 Type 2 diabetes mellitus with hyperglycemia: Secondary | ICD-10-CM

## 2023-03-20 MED ORDER — EPINEPHRINE 0.3 MG/0.3ML IJ SOAJ
0.3000 mg | INTRAMUSCULAR | 2 refills | Status: AC | PRN
Start: 2023-03-20 — End: ?
  Filled 2023-03-20: qty 2, 30d supply, fill #0

## 2023-03-20 NOTE — Telephone Encounter (Signed)
Can someone please place new HST orders? For some reason the orders show up in the patient's chart under "procedures" but do not show up under the appointment desk in the active or finalized requests. Thank you!!

## 2023-03-20 NOTE — Telephone Encounter (Signed)
Decision:Denied Regina Cantu (Key: Sallye Lat) PA Case ID #: [id number] Rx #: [id number] Need Help? Call us at [phone] Outcome Denied today by [employer] 2017 Denied Drug Dexcom G7 Sensor ePA cloud logo Form [employer] Electronic Prior Authorization Form Original Claim Info 75  Fyi there wasn't any information stating why PA has been denied, I only received a response of "Denied"

## 2023-03-20 NOTE — Addendum Note (Signed)
Addended by: Judi Cong on: 03/20/2023 04:06 PM   Modules accepted: Orders

## 2023-03-20 NOTE — Telephone Encounter (Signed)
Order is already in, do not need new order.

## 2023-03-20 NOTE — Telephone Encounter (Signed)
Patient is calling because she states the pharmacy has her prescrption wrong at the pharmacy she thinks. She was seen at  yesterday in the office and thought that you were going to increase her Amitiza because she is still not as regular as she should be. She states she is still taking the exact same does. Please advise   Plan: Chronic constipation - Improved on Amitiza.             Continue amitiza (Lubiprostone) twice daily with food, #180, 1 RF.

## 2023-03-20 NOTE — Telephone Encounter (Signed)
Patient verbalized understanding she will come pick up samples and let us know which one works better. She will stop the Amitiza

## 2023-03-20 NOTE — Telephone Encounter (Signed)
Prior Authorization for patient (Dexcom G7 Sensor) came through on cover my meds was submitted with last office notes and labs awaiting approval or denial.  [id number]

## 2023-03-20 NOTE — Telephone Encounter (Signed)
States she can only take the Miralax when she is off work because when takes it she needs to be close to a bathroom. She state she will have a bowel movement that is hard every 2 to 3 days. She states they are small pellets. She states that she is up to changing to another medication if you feel like this would be better to have normal bowel movements. She states she has never tried Linzess. Do you want to do 145 or 290

## 2023-03-21 ENCOUNTER — Other Ambulatory Visit (HOSPITAL_COMMUNITY): Payer: Self-pay

## 2023-03-21 ENCOUNTER — Other Ambulatory Visit: Payer: Self-pay | Admitting: Student

## 2023-03-21 DIAGNOSIS — E1165 Type 2 diabetes mellitus with hyperglycemia: Secondary | ICD-10-CM

## 2023-03-21 MED ORDER — DEXCOM G7 SENSOR MISC
1.0000 | 3 refills | Status: DC
Start: 2023-03-21 — End: 2023-09-04
  Filled 2023-03-21: qty 3, 30d supply, fill #0
  Filled 2023-04-09 – 2023-09-03 (×3): qty 9, 90d supply, fill #0

## 2023-03-21 MED ORDER — DEXCOM G7 RECEIVER DEVI
Freq: Every day | 12 refills | Status: DC
Start: 2023-03-21 — End: 2024-02-12
  Filled 2023-03-21: qty 1, 30d supply, fill #0

## 2023-03-25 ENCOUNTER — Other Ambulatory Visit (HOSPITAL_COMMUNITY): Payer: Self-pay

## 2023-03-25 ENCOUNTER — Telehealth: Payer: Self-pay | Admitting: *Deleted

## 2023-03-25 NOTE — Telephone Encounter (Signed)
Decision:Denied Regina Cantu (Key: [id number]) PA Case ID #: [id number] Rx #: [id number] Need Help? Call us at [phone] Outcome Denied today by [employer] 2017 Denied Drug Dexcom G7 Sensor ePA cloud logo Form [employer] Electronic Prior Authorization Form Original Claim Info 75

## 2023-03-25 NOTE — Telephone Encounter (Signed)
Prior Authorization was resubmitted with last office notes and labs awaiting approval or denial.  KEY:[id number]

## 2023-03-25 NOTE — Telephone Encounter (Signed)
Call from pt who needs  Dexcom G7 sensor. Pt's aware PA was denied. Pt asking if we have any sensors; per Tobey Bride we do not. Pt states how is she going to check her BS's; I suggested using a meter;she states she prefers Dexcom b/c she's ".always on the the move". I also suggest calling her insurance company - states she will. States she's always fighting her insurance co. I will ask Camille,pharm tech, for any suggestions.

## 2023-03-26 ENCOUNTER — Telehealth: Payer: Self-pay

## 2023-03-26 ENCOUNTER — Encounter: Payer: Self-pay | Admitting: Physician Assistant

## 2023-03-26 ENCOUNTER — Other Ambulatory Visit (HOSPITAL_COMMUNITY): Payer: Self-pay

## 2023-03-26 MED ORDER — LINACLOTIDE 145 MCG PO CAPS
145.0000 ug | ORAL_CAPSULE | Freq: Every day | ORAL | 5 refills | Status: DC
  Filled 2023-03-26: qty 30, 30d supply, fill #0

## 2023-03-26 NOTE — Progress Notes (Signed)
 Internal Medicine Clinic Attending  I was physically present during the key portions of the resident provided service and participated in the medical decision making of patient's management care. I reviewed pertinent patient test results.  The assessment, diagnosis, and plan were formulated together and I agree with the documentation in the resident's note.  Inez Catalina, MD

## 2023-03-26 NOTE — Telephone Encounter (Signed)
Patient requested refill on Linzess 145 mcg- left message on VM letting her know medication has been sent to pharmacy.

## 2023-03-27 ENCOUNTER — Other Ambulatory Visit (HOSPITAL_COMMUNITY): Payer: Self-pay

## 2023-03-27 ENCOUNTER — Other Ambulatory Visit: Payer: Self-pay | Admitting: Student

## 2023-03-28 ENCOUNTER — Telehealth: Payer: Self-pay | Admitting: Dietician

## 2023-03-28 NOTE — Telephone Encounter (Signed)
Patient returning call to offer her a sample freestyle libre 14 day sensor. She states she has called Dexcom for her current sensor that fell off and they are replacing it. She would prefer to wait for that one.  Discussed that her insurance will no cover CGM long tern unless on insulin.

## 2023-04-01 ENCOUNTER — Other Ambulatory Visit (INDEPENDENT_AMBULATORY_CARE_PROVIDER_SITE_OTHER): Payer: Medicaid Other

## 2023-04-01 ENCOUNTER — Other Ambulatory Visit: Payer: Self-pay

## 2023-04-01 ENCOUNTER — Encounter: Payer: Self-pay | Admitting: Orthopedic Surgery

## 2023-04-01 ENCOUNTER — Ambulatory Visit (INDEPENDENT_AMBULATORY_CARE_PROVIDER_SITE_OTHER): Payer: Medicaid Other | Admitting: Orthopedic Surgery

## 2023-04-01 ENCOUNTER — Other Ambulatory Visit (HOSPITAL_COMMUNITY): Payer: Self-pay

## 2023-04-01 VITALS — BP 119/71 | HR 86 | Ht 62.25 in | Wt 191.1 lb

## 2023-04-01 DIAGNOSIS — M545 Low back pain, unspecified: Secondary | ICD-10-CM | POA: Diagnosis not present

## 2023-04-01 NOTE — Progress Notes (Signed)
Orthopedic Spine Surgery Office Note  Assessment: Patient is a 42 y.o. female with chronic low back pain. Has some physical exam findings point to SI joints as possible etiology   Plan: -Explained that initially conservative treatment is tried as a significant number of patients may experience relief with these treatment modalities. Discussed that the conservative treatments include:  -activity modification  -physical therapy  -over the counter pain medications  -medrol dosepak  -lumbar steroid injections -Patient has tried chiropractor, ibuprofen -Told her that she can use up to 1000mg  of tylenol TID, she can use NSAIDs for flares of pain -Told her that she could try voltaren gel on the low back  -Recommended PT, referral provided to her today -Explained that she does have some physical exam findings consistent with SI joint as the etiology, so if she is not doing better at next visit would consider diagnostic/therapeutic SI joint injections -Would need to be nicotine free prior to any elective spine surgery -Patient should return to office in 6 weeks, x-rays at next visit: none   Patient expressed understanding of the plan and all questions were answered to the patient's satisfaction.   ___________________________________________________________________________   History:  Patient is a 42 y.o. female who presents today for lumbar spine.  Patient has had about 30 years of low back pain.  It has gotten worse within the last year or so.  There is no trauma or injury that preceded the onset of pain.  She notes the pain in her lower back and in the area of the SI joints.  Pain also is felt in the left lateral aspect of the hip.  She sometimes feels like her hip and lower back have developed pressure them and need to pop.  She has no pain radiating past the lateral aspect of the hip on the left side.  No pain radiating into the right lower extremity.    Weakness: Yes, at times her left hip  feels weaker.  No other weakness noted Symptoms of imbalance: Denies Paresthesias and numbness: Denies Bowel or bladder incontinence: Has had urgency with urination since a stroke in September. No recent changes in bowel or bladder habits Saddle anesthesia: Denies  Treatments tried: Chiropractor, ibuprofen  Review of systems: Denies fevers and chills, night sweats, unexplained weight loss, history of cancer, pain that wakes them at night  Past medical history: Ovarian cancer in remission Depression/anxiety Migraines HLD HTN GERD History of stroke Diabetes (last A1C was 5.6 on 03/18/2023)  Allergies: Latex, penicillin  Past surgical history:  Hysterectomy and bilateral salpingo-oophorectomy  Social history: Reports use of nicotine product (smoking, vaping, patches, smokeless) Alcohol use: Denies Reports marijuana use, denies other recreational drug use   Physical Exam:  BMI of 34.7  General: no acute distress, appears stated age Neurologic: alert, answering questions appropriately, following commands Respiratory: unlabored breathing on room air, symmetric chest rise Psychiatric: appropriate affect, normal cadence to speech   MSK (spine):  -Strength exam      Left  Right EHL    5/5  5/5 TA    5/5  5/5 GSC    5/5  5/5 Knee extension  5/5  5/5 Hip flexion   5/5  5/5  -Sensory exam    Sensation intact to light touch in L3-S1 nerve distributions of bilateral lower extremities  -Achilles DTR: 1/4 on the left, 1/4 on the right -Patellar tendon DTR: 1/4 on the left, 1/4 on the right  -Straight leg raise: negative bilaterally -Femoral nerve stretch  test: negative bilaterally -Clonus: no beats bilaterally  -Left hip exam: Negative FADIR, no pain through range of motion, TTP over the SI joint, positive Faber, positive SI joint compression test -Right hip exam: Negative FADIR, no pain through range of motion, TTP over the SI joint, positive Faber, positive SI joint  compression test  Imaging: XRs of the lumbar spine from 04/01/2023 were independently reviewed and interpreted, showing disc height loss at L4/5. No other significant degenerative changes. No fracture or dislocation seen.  No evidence of instability on flexion/extension views.  Lordotic alignment.   Patient name: Regina Cantu Patient MRN: [id number] Date of visit: 04/01/23

## 2023-04-02 ENCOUNTER — Ambulatory Visit: Payer: Medicaid Other | Admitting: Neurology

## 2023-04-02 DIAGNOSIS — R0683 Snoring: Secondary | ICD-10-CM

## 2023-04-02 DIAGNOSIS — R0681 Apnea, not elsewhere classified: Secondary | ICD-10-CM

## 2023-04-02 DIAGNOSIS — G4733 Obstructive sleep apnea (adult) (pediatric): Secondary | ICD-10-CM

## 2023-04-02 DIAGNOSIS — E669 Obesity, unspecified: Secondary | ICD-10-CM

## 2023-04-02 DIAGNOSIS — Z8673 Personal history of transient ischemic attack (TIA), and cerebral infarction without residual deficits: Secondary | ICD-10-CM

## 2023-04-03 ENCOUNTER — Ambulatory Visit (INDEPENDENT_AMBULATORY_CARE_PROVIDER_SITE_OTHER): Payer: Medicaid Other | Admitting: Licensed Clinical Social Worker

## 2023-04-03 DIAGNOSIS — F330 Major depressive disorder, recurrent, mild: Secondary | ICD-10-CM | POA: Diagnosis not present

## 2023-04-03 NOTE — Progress Notes (Signed)
See procedure note.

## 2023-04-03 NOTE — BH Specialist Note (Signed)
Integrated Behavioral Health Follow Up In-Person Visit  MRN: [id number] Name: Regina Cantu  Number of Integrated Behavioral Health Clinician visits: Additional Visit  Session Start time: 1330   Session End time: 1430  Total time in minutes: 60   Types of Service: Individual psychotherapy  Interpretor:No. Interpretor Name and Language: N/A  Subjective: Regina Cantu is a 42 y.o. female   Patient was referred by Earl Lagos, MD for Depression. Patient reports the following symptoms/concerns: Conflict with relationship with partner. Patient is wanting a separate from partner, but unable at the moment.  Duration of problem: 10 years or more; Severity of problem: moderate  Objective: Mood: Depressed and Affect: Depressed Risk of harm to self or others: No plan to harm self or others  Life Context: Family and Social: Boyfriend and 2 adult daughters School/Work: Currently employed Self-Care: Patient working on self care activities Life Changes: Patient currently processing and dealing with medical diagnoses   Patient and/or Family's Strengths/Protective Factors: Sense of purpose  Goals Addressed: Patient will:  Reduce symptoms of: anxiety, depression, and mood instability   Increase knowledge and/or ability of: coping skills   Demonstrate ability to: Increase healthy adjustment to current life circumstances  Progress towards Goals: Ongoing  Interventions: Interventions utilized:  Solution-Focused Strategies, Mindfulness or Relaxation Training, CBT Cognitive Behavioral Therapy, and Supportive Counseling Standardized Assessments completed: PHQ-SADS     03/18/2023    9:56 AM 02/05/2023   11:31 AM 02/05/2023   11:30 AM  PHQ-SADS Last 3 Score only  Total GAD-7 Score  21   PHQ Adolescent Score 23  23     Patient and/or Family Response: Patient request additional counseling services with [facility]   Patient Centered Plan: Patient is on the following Treatment  Plan(s): Solution-Focused Strategies, Mindfulness or Relaxation Training, CBT Cognitive Behavioral Therapy, and Supportive Counseling  Assessment: Patient currently experiencing Depression and anxiety .   Patient may benefit from ongoing counseling and working on delayed response to minimize conflict.  Patient participating in [facility] Support groups weekly.     Patient continues to journal, listen to music and spending time with daughters. Patient may benefit from ongoing counseling, writing poetry and practicing self care.  Plan: Follow up with behavioral health clinician on : Patient schedule for 10/02 in person at 1:30 pm  Christen Butter, MSW, LCSW-A She/Her Behavioral Health Clinician [facility]  Internal Medicine Center Direct Dial:[phone]  Fax [phone] Main Office Phone: [phone] [address] [place] [place], [place] [address] Website: [facility] Internal Medicine [facility]  [place], [place]  [place]

## 2023-04-03 NOTE — Procedures (Signed)
   [facility]  HOME SLEEP TEST (Watch PAT) REPORT  STUDY DATE: 04/02/2023  DOB: [birth year]  MRN: [id number]  ORDERING CLINICIAN: Huston Foley, MD, PhD   REFERRING CLINICIAN: Dr. 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  BMI: 36.9 kg/m  FINDINGS:   Sleep Summary:   Total Recording Time (hours, min): 5 hours, 55 min  Total Sleep Time (hours, min):  3 hours, 30 min  Percent REM (%):    20%   Respiratory Indices:   Calculated pAHI (per hour):  4/hour         REM pAHI:    11.6/hour       NREM pAHI: 2.6/hour  Central pAHI: 0/hour  Oxygen Saturation Statistics:    Oxygen Saturation (%) Mean: 95%   Minimum oxygen saturation (%):                 92%   O2 Saturation Range (%): 92-99%    O2 Saturation (minutes) <=88%: 0 min  Pulse Rate Statistics:   Pulse Mean (bpm):    78/min    Pulse Range (57- 110/min)   IMPRESSION: Primary snoring   RECOMMENDATION:  This home sleep test does not demonstrate any significant obstructive or central sleep disordered breathing with a total AHI of less than 5/hour. Her total AHI was 4/hour, O2 nadir of 92%.  Snoring was detected, and appeared to be intermittent in the mild range.  There may have been some underestimation of her sleep disordered breathing secondary to a suboptimal total sleep time of less than 4 hours. Treatment with a positive airway pressure device such as AutoPap or CPAP is not indicated based on this test. Snoring may improve with avoidance of the supine sleep position and weight loss (where clinically appropriate).  For disturbing snoring, an oral appliance through dentistry or  orthodontics can be considered.  Other causes of the patient's symptoms, including circadian rhythm disturbances, an underlying mood disorder, medication effect and/or an underlying medical problem cannot be ruled out based on this test. Clinical correlation is recommended.  The patient should be cautioned not to drive, work at heights, or operate dangerous or heavy equipment when tired or sleepy. Review and reiteration of good sleep hygiene measures should be pursued with any patient. The patient will be advised to follow up with her referring provider, who will be notified of the test results.   I certify that I have reviewed the raw data recording prior to the issuance of this report in accordance with the standards of the American Academy of Sleep Medicine (AASM).  INTERPRETING PHYSICIAN:   Huston Foley, MD, PhD Medical Director, [facility]) Diplomat, ABPN (Neurology and Sleep)   [facility] [address] [place], [place] [address] [phone]

## 2023-04-09 ENCOUNTER — Other Ambulatory Visit (HOSPITAL_COMMUNITY): Payer: Self-pay

## 2023-04-15 ENCOUNTER — Other Ambulatory Visit: Payer: Self-pay | Admitting: Internal Medicine

## 2023-04-15 ENCOUNTER — Ambulatory Visit: Payer: Medicaid Other | Admitting: Student

## 2023-04-15 ENCOUNTER — Encounter: Payer: Self-pay | Admitting: Student

## 2023-04-15 ENCOUNTER — Other Ambulatory Visit (HOSPITAL_COMMUNITY): Payer: Self-pay

## 2023-04-15 VITALS — BP 101/66 | HR 77 | Ht 62.5 in | Wt 193.4 lb

## 2023-04-15 DIAGNOSIS — K219 Gastro-esophageal reflux disease without esophagitis: Secondary | ICD-10-CM

## 2023-04-15 DIAGNOSIS — F330 Major depressive disorder, recurrent, mild: Secondary | ICD-10-CM

## 2023-04-15 DIAGNOSIS — E1165 Type 2 diabetes mellitus with hyperglycemia: Secondary | ICD-10-CM | POA: Diagnosis not present

## 2023-04-15 DIAGNOSIS — I10 Essential (primary) hypertension: Secondary | ICD-10-CM

## 2023-04-15 DIAGNOSIS — F32A Depression, unspecified: Secondary | ICD-10-CM | POA: Diagnosis not present

## 2023-04-15 DIAGNOSIS — G43719 Chronic migraine without aura, intractable, without status migrainosus: Secondary | ICD-10-CM | POA: Diagnosis not present

## 2023-04-15 MED ORDER — OMEPRAZOLE 20 MG PO CPDR
20.0000 mg | DELAYED_RELEASE_CAPSULE | Freq: Every day | ORAL | 3 refills | Status: DC
Start: 2023-04-15 — End: 2023-09-13
  Filled 2023-04-15 – 2023-05-01 (×2): qty 90, 90d supply, fill #0
  Filled 2023-08-01: qty 90, 90d supply, fill #1

## 2023-04-15 MED ORDER — LINACLOTIDE 145 MCG PO CAPS
145.0000 ug | ORAL_CAPSULE | Freq: Every day | ORAL | 5 refills | Status: DC
  Filled 2023-04-15 – 2023-04-22 (×2): qty 30, 30d supply, fill #0
  Filled 2023-05-28: qty 30, 30d supply, fill #1
  Filled 2023-06-24: qty 30, 30d supply, fill #2
  Filled 2023-07-07 – 2023-07-22 (×2): qty 30, 30d supply, fill #3
  Filled 2023-08-23: qty 30, 30d supply, fill #4
  Filled 2023-09-23: qty 30, 30d supply, fill #5

## 2023-04-15 MED ORDER — TOPIRAMATE 25 MG PO TABS
25.0000 mg | ORAL_TABLET | Freq: Every day | ORAL | 3 refills | Status: DC
  Filled 2023-04-15 – 2023-05-01 (×2): qty 30, 30d supply, fill #0
  Filled 2023-06-04: qty 30, 30d supply, fill #1
  Filled 2023-07-05: qty 30, 30d supply, fill #2
  Filled 2023-08-09: qty 30, 30d supply, fill #3

## 2023-04-15 MED ORDER — VENLAFAXINE HCL ER 150 MG PO CP24
150.0000 mg | ORAL_CAPSULE | Freq: Every day | ORAL | 1 refills | Status: DC
  Filled 2023-04-15 – 2023-05-01 (×2): qty 30, 30d supply, fill #0
  Filled 2023-05-28: qty 30, 30d supply, fill #1

## 2023-04-15 MED ORDER — FARXIGA 5 MG PO TABS
5.0000 mg | ORAL_TABLET | Freq: Every day | ORAL | 11 refills | Status: DC
Start: 2023-04-15 — End: 2024-05-27
  Filled 2023-04-15 – 2023-05-01 (×2): qty 30, 30d supply, fill #0
  Filled 2023-06-04: qty 30, 30d supply, fill #1
  Filled 2023-07-05: qty 30, 30d supply, fill #2
  Filled 2023-08-09: qty 30, 30d supply, fill #3
  Filled 2023-09-03: qty 30, 30d supply, fill #4
  Filled 2023-10-11: qty 30, 30d supply, fill #5
  Filled 2023-10-30 – 2023-11-15 (×2): qty 30, 30d supply, fill #6
  Filled 2023-12-31: qty 30, 30d supply, fill #7
  Filled 2024-02-09: qty 30, 30d supply, fill #8
  Filled 2024-03-07 – 2024-03-17 (×2): qty 30, 30d supply, fill #9

## 2023-04-15 MED ORDER — ATORVASTATIN CALCIUM 80 MG PO TABS
80.0000 mg | ORAL_TABLET | Freq: Every day | ORAL | 3 refills | Status: DC
  Filled 2023-04-15 – 2023-05-01 (×2): qty 90, 90d supply, fill #0
  Filled 2023-08-01: qty 90, 90d supply, fill #1
  Filled 2023-10-30: qty 90, 90d supply, fill #2

## 2023-04-15 MED ORDER — LOSARTAN POTASSIUM-HCTZ 50-12.5 MG PO TABS
1.0000 | ORAL_TABLET | Freq: Every day | ORAL | 3 refills | Status: DC
Start: 2023-04-15 — End: 2024-02-12
  Filled 2023-04-15 – 2023-06-06 (×2): qty 90, 90d supply, fill #0
  Filled 2023-09-08: qty 90, 90d supply, fill #1
  Filled 2023-10-30 – 2023-12-05 (×2): qty 90, 90d supply, fill #2

## 2023-04-15 MED ORDER — TRULICITY 0.75 MG/0.5ML ~~LOC~~ SOAJ
0.7500 mg | SUBCUTANEOUS | 2 refills | Status: DC
Start: 2023-04-15 — End: 2023-07-22
  Filled 2023-04-15 – 2023-05-05 (×5): qty 2, 28d supply, fill #0
  Filled 2023-06-04: qty 2, 28d supply, fill #1
  Filled 2023-06-30: qty 2, 28d supply, fill #2

## 2023-04-15 MED ORDER — AMLODIPINE BESYLATE 5 MG PO TABS
5.0000 mg | ORAL_TABLET | Freq: Every day | ORAL | 3 refills | Status: DC
  Filled 2023-04-15 – 2023-06-04 (×2): qty 90, 90d supply, fill #0
  Filled 2023-09-03: qty 90, 90d supply, fill #1
  Filled 2023-10-30: qty 90, 90d supply, fill #2

## 2023-04-15 NOTE — Assessment & Plan Note (Addendum)
Patient says she still has sad days, but her mood is overall stable.  She states that the biggest stressor in her life right now is her living situation as she is struggling to pay her bills. She feels like once that is taken care of, and she is out of debt, her mood will improve.  She is currently on venlafaxine 150 mg daily and reports no side effects or complaints.  She has also been attending integrated behavioral health visits with Marena Chancy, and says these have been helpful.  She has an appointment soon with the [employer], which provides peers support for people with depression. Overall, her mood symptoms seem stable at this time, so we will keep the venlafaxine dose the same.

## 2023-04-15 NOTE — Addendum Note (Signed)
Addended by: Annett Fabian on: 04/15/2023 03:42 PM   Modules accepted: Orders

## 2023-04-15 NOTE — Assessment & Plan Note (Addendum)
Patient's last hemoglobin A1c was 5.6 on 03/18/2023.  She is taking Farxiga 5 mg and Trulicity 0.75 mg daily.  She denies any side effects to these medications including GI symptoms and loss of appetite.  She says that her insurance company is no longer covering her Dexcom, so she has not been checking her sugars recently.  She is under significant financial stress and cannot afford to pay out of pocket. 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  She reports several hypoglycemic episodes recently, especially when she is at work and unable to eat for long stretches of time.  These episodes have caused her a lot of stress and she felt like she was able to better manage her daily blood sugars when she had a Dexcom.  I have sent a refill for her Marcelline Deist and Trulicity. Since she just recently had a hemoglobin A1c and a CMP, we will hold off on further labs at this time and recheck them in 2-3 months.   I have also put in a referral for her to meet with Lupita Leash, our diabetes educator, for assistance regarding her Dexcom and dietary strategies to avoid her hypoglycemic episodes.

## 2023-04-15 NOTE — Assessment & Plan Note (Addendum)
Blood pressure today is 101/66, patient is asymptomatic.  Has been taking losartan-hydrochlorothiazide 50-12.5 mg daily and amlodipine 5 mg daily.  Does not have any concerns with these medications.  Denies any lightheadedness/dizziness or symptoms related to hypertension.  Refilled these medications. BP is well controlled, so no changes are needed at this time.

## 2023-04-15 NOTE — Progress Notes (Addendum)
CC: Diabetes follow up  HPI:  [name] is a 42 y.o. female living with a history stated below and presents today for diabetes follow up. Please see problem based assessment and plan for additional details.  Past Medical History:  Diagnosis Date   Anxiety    Cancer (HCC)    ovarian 2001 and 2010   Depression    Essential hypertension 05/04/2022   Herniated disc, cervical 2011   Hypertension    Migraine    Pityriasis rosea    Uncontrolled type 2 diabetes mellitus with hyperglycemia (HCC) 05/04/2022   URI, acute 08/17/2022    Current Outpatient Medications on File Prior to Visit  Medication Sig Dispense Refill   aspirin EC 81 MG tablet Take 81 mg by mouth daily. Swallow whole.     Continuous Glucose Receiver (DEXCOM G7 RECEIVER) DEVI Use as directed 1 each 12   Continuous Glucose Sensor (DEXCOM G7 SENSOR) MISC Place new sensor every 10 days. Use to monitor your blood sugar continuously. 9 each 3   EPINEPHrine 0.3 mg/0.3 mL IJ SOAJ injection Inject 0.3 mg into the muscle as needed for anaphylaxis. 1 each 2   Rimegepant Sulfate (NURTEC) 75 MG TBDP Take 1/2 tablet (37.5 mg total) by mouth daily as needed 60 tablet 0   No current facility-administered medications on file prior to visit.    Family History  Problem Relation Age of Onset   Hyperlipidemia Mother    Hypertension Mother    Diabetes Mother    Stroke Mother    Cerebral aneurysm Mother    Cerebral aneurysm Father    Cancer Father    Stroke Father    Breast cancer Maternal Aunt 24   Aneurysm Cousin    Heart Problems Cousin    Heart Problems Cousin    Heart Problems Cousin    Social History   Socioeconomic History   Marital status: Single    Spouse name: Not on file   Number of children: Not on file   Years of education: Not on file   Highest education level: Not on file  Occupational History   Not on file  Tobacco Use   Smoking status: Every Day    Current packs/day: 0.50    Average  packs/day: 0.5 packs/day for 24.9 years (12.4 ttl pk-yrs)    Types: Cigarettes    Start date: 06/06/1998   Smokeless tobacco: Never   Tobacco comments:    3 cigs per day  Vaping Use   Vaping status: Never Used  Substance and Sexual Activity   Alcohol use: Not Currently    Comment: rare   Drug use: Yes    Types: Marijuana    Comment: uses to eat   Sexual activity: Not Currently    Partners: Male  Other Topics Concern   Not on file  Social History Narrative   Caffiene rare   Working:  not right now,  trying to get job   Lives with partner.   Social Determinants of Health   Financial Resource Strain: Medium Risk (09/13/2022)   Overall Financial Resource Strain (CARDIA)    Difficulty of Paying Living Expenses: Somewhat hard  Food Insecurity: Food Insecurity Present (09/13/2022)   Hunger Vital Sign    Worried About Running Out of Food in the Last Year: Often true    Ran Out of Food in the Last Year: Often true  Transportation Needs: No Transportation Needs (09/13/2022)   PRAPARE - Transportation    Lack of  Transportation (Medical): No    Lack of Transportation (Non-Medical): No  Physical Activity: Not on file  Stress: Stress Concern Present (09/13/2022)   [employer] of Occupational Health - Occupational Stress Questionnaire    Feeling of Stress : Very much  Social Connections: Moderately Integrated (09/13/2022)   Social Connection and Isolation Panel [NHANES]    Frequency of Communication with Friends and Family: Once a week    Frequency of Social Gatherings with Friends and Family: Once a week    Attends Religious Services: 1 to 4 times per year    Active Member of [employer] or Organizations: No    Attends Banker Meetings: 1 to 4 times per year    Marital Status: Living with partner  Intimate Partner Violence: Not At Risk (09/13/2022)   Humiliation, Afraid, Rape, and Kick questionnaire    Fear of Current or Ex-Partner: No    Emotionally Abused: No    Physically  Abused: No    Sexually Abused: No   Review of Systems: ROS negative except for what is noted on the assessment and plan.  Vitals:   04/15/23 1310 04/15/23 1312  BP: 94/60 101/66  Pulse: 78 77  SpO2: 100%   Weight: 193 lb 6.4 oz (87.7 kg)   Height: 5' 2.5" (1.588 m)    Physical Exam: Constitutional: well-appearing, in no acute distress HENT: normocephalic atraumatic, mucous membranes moist Eyes: conjunctiva non-erythematous Cardiovascular: regular rate and rhythm, no m/r/g Pulmonary/Chest: normal work of breathing on room air, lungs clear to auscultation bilaterally MSK: normal bulk and tone Neurological: alert & oriented x 3, no focal deficit Skin: warm and dry Psych: normal mood and behavior  Assessment & Plan:   Patient seen with Dr. Criselda Peaches  Essential hypertension Blood pressure today is 101/66, patient is asymptomatic.  Has been taking losartan-hydrochlorothiazide 50-12.5 mg daily and amlodipine 5 mg daily.  Does not have any concerns with these medications.  Denies any lightheadedness/dizziness or symptoms related to hypertension.  Refilled these medications. BP is well controlled, so no changes are needed at this time.   Uncontrolled type 2 diabetes mellitus with hyperglycemia (HCC) Patient's last hemoglobin A1c was 5.6 on 03/18/2023.  She is taking Farxiga 5 mg and Trulicity 0.75 mg daily.  She denies any side effects to these medications including GI symptoms and loss of appetite.  She says that her insurance company is no longer covering her Dexcom, so she has not been checking her sugars recently.  She is under significant financial stress and cannot afford to pay out of pocket. 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  She reports several hypoglycemic episodes recently, especially when she is at work and unable to eat for long stretches of time.  These episodes have caused her a lot of stress and she felt like she  was able to better manage her daily blood sugars when she had a Dexcom.  I have sent a refill for her Marcelline Deist and Trulicity. Since she just recently had a hemoglobin A1c and a CMP, we will hold off on further labs at this time and recheck them in 2-3 months.   I have also put in a referral for her to meet with Lupita Leash, our diabetes educator, for assistance regarding her Dexcom and dietary strategies to avoid her hypoglycemic episodes.   Depression Patient says she still has sad days, but her mood is overall stable.  She states that the  biggest stressor in her life right now is her living situation as she is struggling to pay her bills. She feels like once that is taken care of, and she is out of debt, her mood will improve.  She is currently on venlafaxine 150 mg daily and reports no side effects or complaints.  She has also been attending integrated behavioral health visits with Marena Chancy, and says these have been helpful.  She has an appointment soon with the [employer], which provides peers support for people with depression. Overall, her mood symptoms seem stable at this time, so we will keep the venlafaxine dose the same.  Annett Fabian, MD  [facility] Internal Medicine, PGY-1 Phone: [phone] Date 04/15/2023 Time 3:41 PM

## 2023-04-15 NOTE — Patient Instructions (Signed)
[name],  Thank you, [name] for allowing [place] to provide your care today. Today we discussed your blood pressure, diabetes, and depression.    We took your blood pressure today and it was 101/66, which is normal.  Please keep taking your losartan-hydrochlorothiazide and amlodipine as prescribed.  Your hemoglobin A1c at your last visit was 5.6, and your metabolic panel and electrolytes were normal at that time, so we will hold off on repeating these labs for now and we will recheck them again at your follow-up visit in 2 to 3 months.  I have sent a message to Lupita Leash, our diabetes educator, and we will see what we can do regarding insurance coverage of your Dexcom.  I will keep you updated.  In the meantime, continue to take your Farxiga 5 mg daily and Trulicity 0.75 mg weekly.  You may continue to take your venlafaxine 150 mg as prescribed.  You have a follow-up appointment scheduled on 10/2 with Marena Chancy, for integrated behavioral health specialist.  I was encouraged by your initiative in making an appointment with the [facility], and look forward to hearing how that goes.   I have refilled the following medications:   Start the following medications: Meds ordered this encounter  Medications   Dulaglutide (TRULICITY) 0.75 MG/0.5ML SOPN    Sig: Inject 0.75 mg into the skin once a week.    Dispense:  2 mL    Refill:  2   losartan-hydrochlorothiazide (HYZAAR) 50-12.5 MG tablet    Sig: Take 1 tablet by mouth daily.    Dispense:  90 tablet    Refill:  3   venlafaxine XR (EFFEXOR-XR) 150 MG 24 hr capsule    Sig: Take 1 capsule (150 mg total) by mouth daily with breakfast.    Dispense:  30 capsule    Refill:  1   omeprazole (PRILOSEC) 20 MG capsule    Sig: Take 1 capsule (20 mg total) by mouth daily.    Dispense:  90 capsule    Refill:  3   amLODipine (NORVASC) 5 MG tablet    Sig: Take 1 tablet (5 mg total) by mouth daily.    Dispense:  90 tablet    Refill:  3    FARXIGA 5 MG TABS tablet    Sig: Take 1 tablet (5 mg total) by mouth daily before breakfast.    Dispense:  30 tablet    Refill:  11   atorvastatin (LIPITOR) 80 MG tablet    Sig: Take 1 tablet (80 mg total) by mouth daily.    Dispense:  90 tablet    Refill:  3   topiramate (TOPAMAX) 25 MG tablet    Sig: Take 1 tablet (25 mg total) by mouth at bedtime.    Dispense:  30 tablet    Refill:  3   linaclotide (LINZESS) 145 MCG CAPS capsule    Sig: Take 1 capsule (145 mcg total) by mouth daily before breakfast.    Dispense:  30 capsule    Refill:  5     Follow up: 2-3 months   We look forward to seeing you next time. Please call our clinic at [phone] if you have any questions or concerns. The best time to call is Monday-Friday from 9am-4pm, but there is someone available 24/7. If after hours or the weekend, call the main hospital number and ask for the Internal Medicine Resident On-Call. If you need medication refills, please notify your pharmacy one week in  advance and they will send [place] a request.   Thank you for trusting me with your care. Wishing you the best!   Annett Fabian, MD [facility]

## 2023-04-19 ENCOUNTER — Ambulatory Visit: Payer: Medicaid Other

## 2023-04-19 ENCOUNTER — Other Ambulatory Visit (INDEPENDENT_AMBULATORY_CARE_PROVIDER_SITE_OTHER): Payer: Self-pay

## 2023-04-19 DIAGNOSIS — Z23 Encounter for immunization: Secondary | ICD-10-CM | POA: Diagnosis not present

## 2023-04-22 ENCOUNTER — Other Ambulatory Visit (HOSPITAL_COMMUNITY): Payer: Self-pay
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
## 2023-04-23 ENCOUNTER — Ambulatory Visit: Payer: Medicaid Other | Attending: Orthopedic Surgery

## 2023-04-23 ENCOUNTER — Other Ambulatory Visit: Payer: Self-pay

## 2023-04-23 DIAGNOSIS — M5431 Sciatica, right side: Secondary | ICD-10-CM | POA: Insufficient documentation

## 2023-04-23 DIAGNOSIS — M545 Low back pain, unspecified: Secondary | ICD-10-CM | POA: Diagnosis not present

## 2023-04-23 DIAGNOSIS — M5459 Other low back pain: Secondary | ICD-10-CM | POA: Diagnosis not present

## 2023-04-23 DIAGNOSIS — M5432 Sciatica, left side: Secondary | ICD-10-CM | POA: Diagnosis not present

## 2023-04-23 IMAGING — CR DG CHEST 2V
2 series · 2 of 2 positions shown · non-contrast
Comparison: 01/07/2017

CLINICAL DATA: Fever

EXAM:
CHEST - 2 VIEW

[w chest pa]
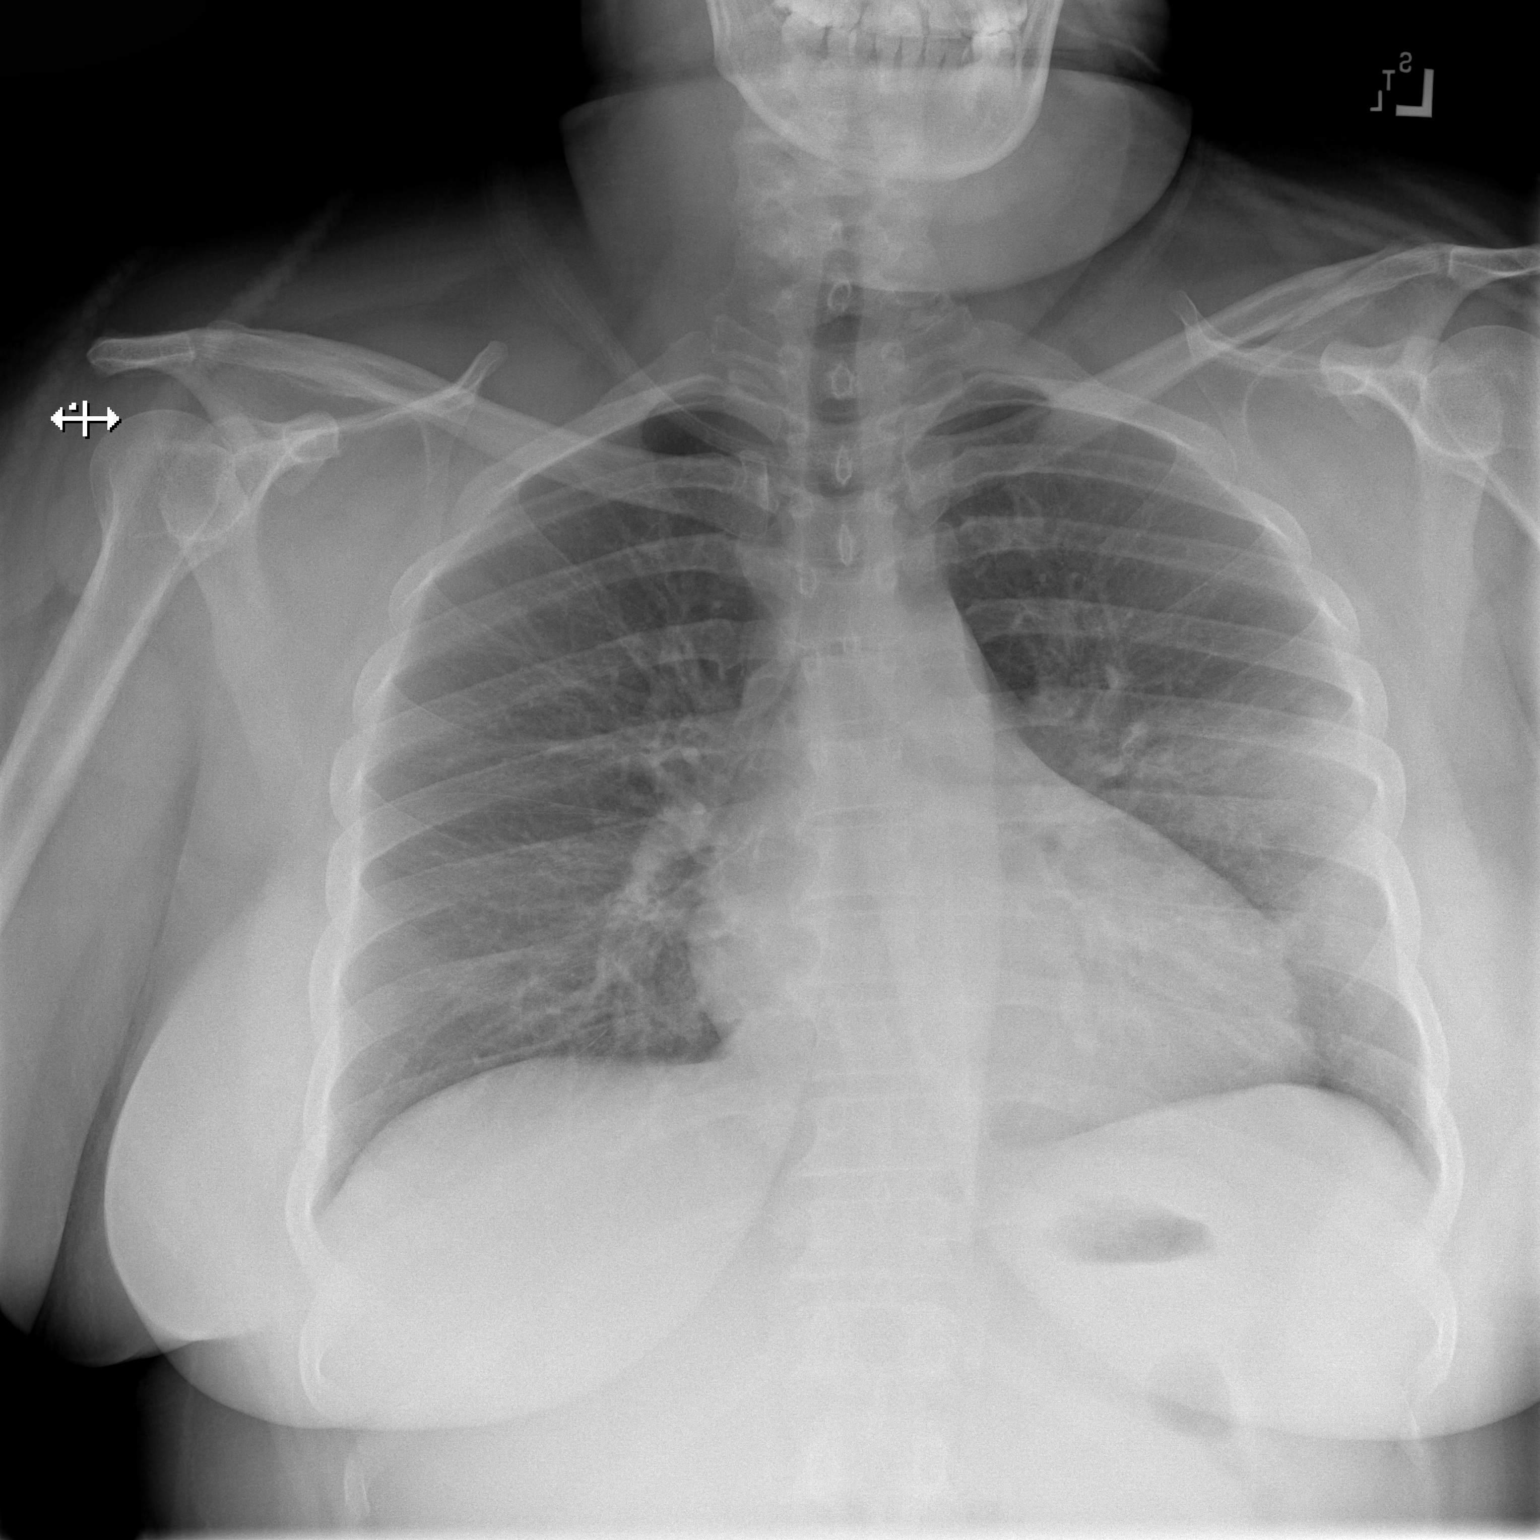

[w chest lat]
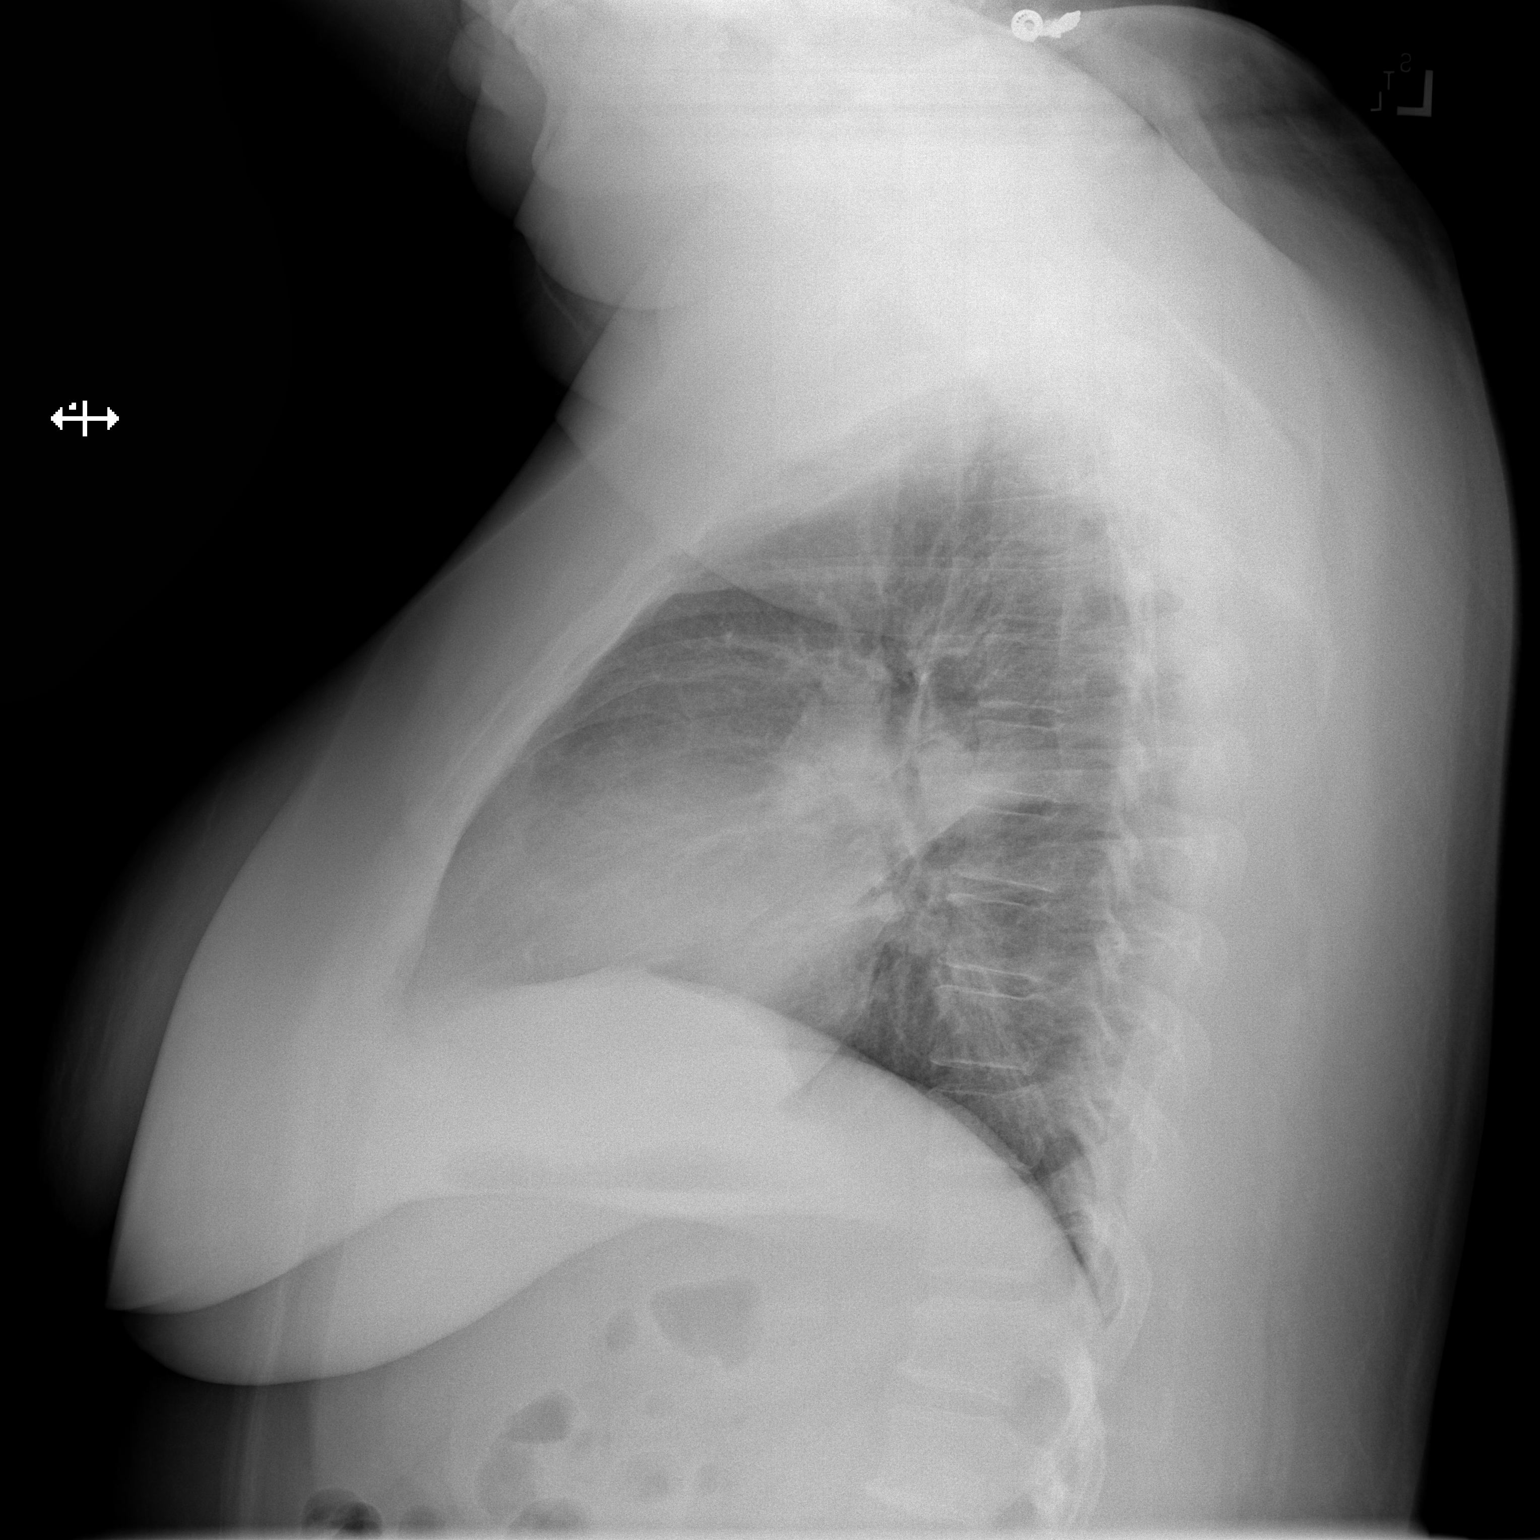

[2 of 2 positions shown; findings below may reference images not displayed]

FINDINGS: Transverse diameter of heart is slightly increased. There is
alveolar infiltrate in the left mid lung fields. The infiltrate
appears to be anterior in the lateral projection suggesting
pneumonia in the left upper lobe/lingula. There is no pleural
effusion or pneumothorax.
IMPRESSION: There is new alveolar infiltrate in the left mid lung fields
suggesting left upper lobe pneumonia.

## 2023-04-26 ENCOUNTER — Other Ambulatory Visit (HOSPITAL_COMMUNITY): Payer: Self-pay

## 2023-04-27 NOTE — Progress Notes (Signed)
Internal Medicine Clinic Attending  I was physically present during the key portions of the resident provided service and participated in the medical decision making of patient's management care. I reviewed pertinent patient test results.  The assessment, diagnosis, and plan were formulated together and I agree with the documentation in the resident's note.  Patient's A1C is quite low, if she does not need farxiga for other diagnosis, can consider stopping this medication at next visit.   Inez Catalina, MD

## 2023-04-27 NOTE — Addendum Note (Signed)
Addended by: Debe Coder B on: 04/27/2023 09:21 AM   Modules accepted: Level of Service

## 2023-04-29 NOTE — Therapy (Unsigned)
OUTPATIENT PHYSICAL THERAPY THORACOLUMBAR EVALUATION   Patient Name: Regina Cantu MRN: [id number] DOB:1981-03-23, 42 y.o., female Today's Date: 04/30/2023  END OF SESSION:  PT End of Session - 04/30/23 0804     Visit Number 2    Number of Visits 13    Date for PT Re-Evaluation 06/14/23    Authorization Type [place] MEDICAID [employer] CARITAS OF [place]    PT Start Time 0803    PT Stop Time 0845    PT Time Calculation (min) 42 min    Activity Tolerance Patient tolerated treatment well    Behavior During Therapy [facility] for tasks assessed/performed              Past Medical History:  Diagnosis Date   Anxiety    Cancer (HCC)    ovarian 2001 and 2010   Depression    Essential hypertension 05/04/2022   Herniated disc, cervical 2011   Hypertension    Migraine    Pityriasis rosea    Uncontrolled type 2 diabetes mellitus with hyperglycemia (HCC) 05/04/2022   URI, acute 08/17/2022   Past Surgical History:  Procedure Laterality Date   ABDOMINAL HYSTERECTOMY     OVARIAN CANCER     Patient Active Problem List   Diagnosis Date Noted   Back pain 03/18/2023   Abnormal Pap smear of cervix 12/27/2022   Tobacco use 08/17/2022   Depression 08/17/2022   Healthcare maintenance 07/04/2022   Migraine 07/04/2022   History of CVA (cerebrovascular accident) 05/04/2022   History of ovarian cancer 05/04/2022   Uncontrolled type 2 diabetes mellitus with hyperglycemia (HCC) 05/04/2022   Essential hypertension 05/04/2022   Malignant (primary) neoplasm, unspecified (HCC) 05/24/2021   Other specified disorders of eustachian tube, bilateral 05/24/2021   Allergy, unspecified, initial encounter 05/24/2021    PCP: Chauncey Mann, DO  REFERRING PROVIDER: London Sheer, MD  REFERRING DIAG: M54.50 (ICD-10-CM) - Low back pain, unspecified back pain laterality, unspecified chronicity, unspecified whether sciatica presen   Rationale for Evaluation and Treatment: Rehabilitation  THERAPY  DIAG:  Other low back pain  Sciatica, left side  Sciatica, right side  ONSET DATE: 30 years ago  SUBJECTIVE:                                                                                                                                                                                           SUBJECTIVE STATEMENT: Pain in low back today 5/10.  Min pain in legs. L shoulder pain when driving. Pt unable to use her L shoulder she thinks she slept on it wrong.  She thinks it out of place.   PERTINENT  HISTORY:  High BMI, DM- uncontrolled, Stroke  PAIN:  Are you having pain? Yes: NPRS scale: 5/10. Pain range on eval: 5-8/10 Pain location: Low back to hips and knees Pain description: Throbbing, achy Aggravating factors: Prolonged standing, walking, sitting Relieving factors: Stretching, ocasional medications  PRECAUTIONS: None  RED FLAGS: None   WEIGHT BEARING RESTRICTIONS: No  FALLS:  Has patient fallen in last 6 months?  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 To mange pain better.  NEXT MD VISIT: 10/7 Dr. Christell Constant  OBJECTIVE:   DIAGNOSTIC FINDINGS:  XRs of the lumbar spine from 04/01/2023 were independently reviewed and  interpreted, showing disc height loss at L4/5. No other significant  degenerative changes. No fracture or dislocation seen.  No evidence of  instability on flexion/extension views.  Lordotic alignment.   PATIENT SURVEYS:  FOTO: Perceived function 45%, predicted 52%   SCREENING FOR RED FLAGS: Bowel or bladder incontinence: No Spinal tumors: No Cauda equina syndrome: No Compression fracture: No  COGNITION: Overall cognitive status: Within functional limits for tasks  assessed     SENSATION: WFL  MUSCLE LENGTH: Hamstrings: Right WNLs deg; Left WNLs deg Maisie Fus test: Right Tight deg; Left Tight deg  POSTURE: rounded shoulders, forward head, and increased lumbar lordosis  PALPATION: Repot of pressure to midline low back, paraspinal, PSIs, and gluteals  LUMBAR ROM:   AROM eval  Flexion Full, relief, use hands to walk up to return to standing  Extension Markedly limited, pain  Right lateral flexion Min limited, no pain  Left lateral flexion Min limited, pain R  Right rotation Full, no pain  Left rotation Full, pain R   (Blank rows = not tested)  LOWER EXTREMITY ROM:     WNLs Active  Right eval Left eval  Hip flexion    Hip extension    Hip abduction    Hip adduction    Hip internal rotation    Hip external rotation    Knee flexion    Knee extension    Ankle dorsiflexion    Ankle plantarflexion    Ankle inversion    Ankle eversion     (Blank rows = not tested)  LOWER EXTREMITY MMT:    Grossly 4+ to 5/5 and equal. Myotome screen neg  Weak core MMT Right eval Left eval  Hip flexion    Hip extension    Hip abduction    Hip adduction    Hip internal rotation    Hip external rotation    Knee flexion    Knee extension    Ankle dorsiflexion    Ankle plantarflexion    Ankle inversion    Ankle eversion     (Blank rows = not tested)  LUMBAR SPECIAL TESTS:  Straight leg raise test: Negative, Slump test: Negative, SI Compression/distraction test: Negative, and FABER test: Negative  FUNCTIONAL TESTS:  NT  GAIT: Distance walked: 200' Assistive device utilized: None Level of assistance: Complete Independence Comments: WNLs  TODAY'S TREATMENT:       [facility] Adult PT Treatment:  DATE: 04/30/23 Therapeutic Exercise: Supine posterior pelvic tilt to neutral Tilt to bridge added green band x 10  Trunk rotation to reset Hooklying clam green band x 10 each side, unilateral  Knee to  chest 30 sec x 3 Piriformis x 30 sec x 1 , not tight  Sit to stand 10 lbs x 15  Horizontal abduction unable with L shoulder  Standing row and extension green band x 10 Standing quad stretch  Standing hip flexor stretch  Palloff press green band x 15   Self Care: Popping joints, mobility vs flexibility      [facility] Adult PT Treatment:                                                DATE: 04/23/23 Therapeutic Exercise: Developed, instructed in, and pt completed therex as noted in HEP   PATIENT EDUCATION:  Education details: Eval findings, POC, HEP, self care  Person educated: Patient Education method: Explanation, Demonstration, Tactile cues, Verbal cues, and Handouts Education comprehension: verbalized understanding, returned demonstration, verbal cues required, tactile cues required, and needs further education  HOME EXERCISE PROGRAM: Access Code: [email or web address] URL: [email or web address] Date: 04/23/2023 Prepared by: Joellyn Rued Access Code: [email or web address] URL: [email or web address] Date: 04/30/2023 Prepared by: Karie Mainland  Exercises - Supine Bridge  - 2 x daily - 7 x weekly - 1 sets - 10-15 reps - 5 hold - Supine Posterior Pelvic Tilt  - 2 x daily - 7 x weekly - 1 sets - 10-15 reps - 3 hold - Hooklying Clamshell with Resistance  - 2 x daily - 7 x weekly - 1 sets - 10-15 reps - 3 hold - Sit to Stand Without Arm Support  - 1 x daily - 7 x weekly - 2 sets - 10 reps - Standing Shoulder Row with Anchored Resistance  - 1 x daily - 7 x weekly - 2 sets - 10 reps - 5 hold - Shoulder extension with resistance - Neutral  - 1 x daily - 7 x weekly - 2 sets - 10 reps - 5 hold  ASSESSMENT:  CLINICAL IMPRESSION: Patient tolerated HEP well, needed some cues for technique.  She has a sense of tightness in her body (hips, knees and spine) that is more internal, she has no limitations in hip and knee ROM but feels like it helps her to "pop" her joints. She gets easily fatigued  with standing exercises.  Pt was limited by L shoulder today, her pain is not new to her but she could not lift arm > 90 deg. Cont POC for general core strength and postural training.    OBJECTIVE IMPAIRMENTS: difficulty walking, decreased ROM, decreased strength, obesity, and pain.   ACTIVITY LIMITATIONS: carrying, lifting, bending, sitting, standing, squatting, sleeping, bathing, reach over head, hygiene/grooming, and locomotion level  PARTICIPATION LIMITATIONS: meal prep, cleaning, laundry, shopping, community activity, occupation, and yard work  PERSONAL FACTORS: Fitness, Past/current experiences, Time since onset of injury/illness/exacerbation, and 1 comorbidity: high BMI  are also affecting patient's functional outcome.   REHAB POTENTIAL: Good  CLINICAL DECISION MAKING: Evolving/moderate complexity  EVALUATION COMPLEXITY: Moderate   GOALS:  SHORT TERM GOALS: Target date: 05/17/23  Pt will be Ind in an initial HEP  Baseline: started Goal status: INITIAL  2.  Pt will voice understanding of measures to assist in pain reduction Baseline: started Goal status: INITIAL  LONG TERM GOALS: Target date: 06/14/23  Pt will be Ind in a final HEP to maintain achieved LOF  Baseline:  Goal status: INITIAL  2.  Improve trunk extension ROM to a min limitation for improved function Baseline: markedly imited Goal status: INITIAL  3.  Improve back/core strength to where the pt can sustain a bridge for 60 sec and forward plank from toes for 30 sec Baseline:  Goal status: INITIAL  4.  Pt will report a 50% improvement in low back pain with daily and work related activities for improved function and QOL Baseline: 5-8/10 Goal status: INITIAL  5.  Pt's FOTO score will improved to the predicted value of 52% as indication of improved function  Baseline: 45% Goal status: INITIAL  PLAN:  PT FREQUENCY: 2x/week  PT DURATION: 6 weeks  PLANNED INTERVENTIONS: Therapeutic exercises,  Therapeutic activity, Neuromuscular re-education, Balance training, Gait training, Patient/Family education, Self Care, Joint mobilization, Aquatic Therapy, Dry Needling, Electrical stimulation, Spinal mobilization, Cryotherapy, Moist heat, Taping, Traction, Ultrasound, Ionotophoresis 4mg /ml Dexamethasone, Manual therapy, and Re-evaluation.  PLAN FOR NEXT SESSION: Review FOTO; assess response to HEP; progress therex as indicated; use of modalities, manual therapy; and TPDN as indicated.   Allen Ralls MS, PT 04/30/23 8:42 AM  Check all possible CPT codes: 06237 - PT Re-evaluation, 97110- Therapeutic Exercise, [phone] - Gait Training, [phone] - Manual Therapy, 97530 - Therapeutic Activities, [phone] - Self Care, [phone] - Electrical stimulation (Manual), [id number] - Ultrasound, and [id number] - Aquatic therapy    Check all conditions that are expected to impact treatment: {Conditions expected to impact treatment:Morbid obesity, Diabetes mellitus, and Musculoskeletal disorders   If treatment provided at initial evaluation, no treatment charged due to lack of authorization.

## 2023-04-30 ENCOUNTER — Encounter: Payer: Self-pay | Admitting: Physical Therapy

## 2023-04-30 ENCOUNTER — Ambulatory Visit: Payer: Medicaid Other | Admitting: Physical Therapy

## 2023-04-30 DIAGNOSIS — M5432 Sciatica, left side: Secondary | ICD-10-CM

## 2023-04-30 DIAGNOSIS — M5431 Sciatica, right side: Secondary | ICD-10-CM | POA: Diagnosis not present

## 2023-04-30 DIAGNOSIS — M5459 Other low back pain: Secondary | ICD-10-CM | POA: Diagnosis not present

## 2023-04-30 DIAGNOSIS — M545 Low back pain, unspecified: Secondary | ICD-10-CM | POA: Diagnosis not present

## 2023-05-01 ENCOUNTER — Telehealth: Payer: Self-pay

## 2023-05-01 ENCOUNTER — Other Ambulatory Visit: Payer: Self-pay

## 2023-05-01 ENCOUNTER — Ambulatory Visit: Payer: Medicaid Other | Admitting: Dietician

## 2023-05-01 ENCOUNTER — Other Ambulatory Visit (HOSPITAL_COMMUNITY): Payer: Self-pay

## 2023-05-01 ENCOUNTER — Encounter: Payer: Self-pay | Admitting: Dietician

## 2023-05-01 VITALS — Wt 194.3 lb

## 2023-05-01 DIAGNOSIS — E1165 Type 2 diabetes mellitus with hyperglycemia: Secondary | ICD-10-CM

## 2023-05-01 DIAGNOSIS — E119 Type 2 diabetes mellitus without complications: Secondary | ICD-10-CM | POA: Diagnosis not present

## 2023-05-01 NOTE — Progress Notes (Signed)
Diabetes Self-Management Education  Visit Type: First/Initial  Appt. Start Time: 1330 Appt. End Time: 1400  05/01/2023  [name], identified by name and date of birth, is a 42 y.o. female with a diagnosis of Diabetes: Type 2.   ASSESSMENT  Weight 194 lb 4.8 oz (88.1 kg). Body mass index is 34.97 kg/m. Wt Readings from Last 10 Encounters:  05/01/23 194 lb 4.8 oz (88.1 kg)  04/15/23 193 lb 6.4 oz (87.7 kg)  04/01/23 191 lb 1.6 oz (86.7 kg)  03/19/23 190 lb 6.4 oz (86.4 kg)  03/18/23 192 lb (87.1 kg)  02/05/23 198 lb 9.6 oz (90.1 kg)  02/01/23 195 lb 9.6 oz (88.7 kg)  01/14/23 200 lb 3.2 oz (90.8 kg)  01/02/23 190 lb 3.2 oz (86.3 kg)  12/27/22 195 lb 4.8 oz (88.6 kg)   BP Readings from Last 3 Encounters:  04/15/23 101/66  04/01/23 119/71  03/19/23 122/76   Lipid Panel     Component Value Date/Time   CHOL 152 09/13/2022 1104   TRIG 116 09/13/2022 1104   HDL 40 09/13/2022 1104   CHOLHDL 3.8 09/13/2022 1104   CHOLHDL 5.7 05/05/2022 1052   VLDL 45 (H) 05/05/2022 1052   LDLCALC 91 09/13/2022 1104   LABVLDL 21 09/13/2022 1104    Lab Results  Component Value Date   HGBA1C 5.6 03/18/2023   HGBA1C 6.0 (A) 11/09/2022   HGBA1C 6.5 (A) 08/16/2022   HGBA1C 8.4 (H) 05/05/2022   HGBA1C 7.6 (H) 01/13/2022    Congratulated [name] on her blood sugar control progress.She has excellent control of both her diabetes, lipids and blood pressure Regina Cantu states she is cutting back on smoking but is not ready to quit. She states that she does not get the flu vaccine or covid vaccine.    Diabetes Self-Management Education - 05/01/23 1400       Visit Information   Visit Type First/Initial      Initial Visit   Diabetes Type Type 2    Are you currently following a meal plan? Yes    What type of meal plan do you follow? trying to make healthy choices    Are you taking your medications as prescribed? Yes      Health Coping   How would you rate your overall health? Good       Psychosocial Assessment   Patient Belief/Attitude about Diabetes Motivated to manage diabetes    What is the hardest part about your diabetes right now, causing you the most concern, or is the most worrisome to you about your diabetes?   Checking blood sugar    Self-care barriers Lack of material resources    Self-management support Family;Friends;Doctor's office;CDE visits    Patient Concerns Monitoring;Nutrition/Meal planning   preventing and treating hypoglycemia   Special Needs None    Preferred Learning Style No preference indicated    Learning Readiness Ready    How often do you need to have someone help you when you read instructions, pamphlets, or other written materials from your doctor or pharmacy? 2 - Rarely    What is the last grade level you completed in school? 12      Pre-Education Assessment   Patient understands the diabetes disease and treatment process. Comprehends key points    Patient understands incorporating nutritional management into lifestyle. Needs Review    Patient undertands incorporating physical activity into lifestyle. Comprehends key points    Patient understands using medications safely. Comprehends key points  Patient understands monitoring blood glucose, interpreting and using results Needs Review    Patient understands prevention, detection, and treatment of acute complications. Needs Review    Patient understands prevention, detection, and treatment of chronic complications. Compreheands key points    Patient understands how to develop strategies to address psychosocial issues. Comprehends key points    Patient understands how to develop strategies to promote health/change behavior. Comprehends key points      Complications   Last HgB A1C per patient/outside source 5.6 %    How often do you check your blood sugar? 0 times/day (not testing)    Fasting Blood glucose range (mg/dL) --   unknown   Number of hypoglycemic episodes per month 1    Can you  tell when your blood sugar is low? Yes    What do you do if your blood sugar is low? eats 1 lifesaver andopens windows    Number of hyperglycemic episodes ( >200mg /dL): Rare    Can you tell when your blood sugar is high? --   it is not high   Have you had a dilated eye exam in the past 12 months? Yes    Have you had a dental exam in the past 12 months? Yes    Are you checking your feet? Yes    How many days per week are you checking your feet? 7      Dietary Intake   Breakfast 1/2 bagel, water    Snack (morning) other 1/2 bagel. water    Lunch watermelon or banana chips    Dinner fish nuggest, chicken, watermelon, water,    Snack (evening) mashed potatoes with beans in them    Beverage(s) water, no etoh, no soda      Activity / Exercise   Activity / Exercise Type ADL's;Light (walking / raking leaves)    How many days per week do you exercise? 5    How many minutes per day do you exercise? 60   at work   Total minutes per week of exercise 300      Patient Education   Previous Diabetes Education No    Healthy Eating Meal timing in regards to the patients' current diabetes medication.;Other (comment)   prevention of hypoglycemia meal plan (eating balanced small meals/snacks 5-6x/day)   Monitoring Identified appropriate SMBG and/or A1C goals.;Interpreting lab values - A1C, lipid, urine microalbumina.;Taught/evaluated CGM (comment)    Acute complications Taught prevention, symptoms, and  treatment of hypoglycemia - the 15 rule.      Individualized Goals (developed by patient)   Nutrition Follow meal plan discussed      Post-Education Assessment   Patient understands incorporating nutritional management into lifestyle. Comprehends key points    Patient understands monitoring blood glucose, interpreting and using results Comprehends key points    Patient understands prevention, detection, and treatment of acute complications. Comprehends key points      Outcomes   Expected Outcomes  Demonstrated interest in learning. Expect positive outcomes    Future DMSE Yearly    Program Status Completed             Individualized Plan for Diabetes Self-Management Training:   Learning Objective:  Patient will have a greater understanding of diabetes self-management. Patient education plan is to attend individual and/or group sessions per assessed needs and concerns.   Plan:   Patient Instructions  Thank you for the visit today!  Have fun with the freestyle libre 14 day CGM. Call me with questions.  We talked about preventing low blood sugar by eating carbs through out the day not skipping meals.  Let's follow up next year September 2025  Lupita Leash [phone]   Expected Outcomes:  Demonstrated interest in learning. Expect positive outcomes  Education material provided: Meal plan card and Diabetes Resources  If problems or questions, patient to contact team via:  Phone and Email  Future DSME appointment: Baker Pierini Bladen Umar, RD 05/01/2023 3:00 PM.

## 2023-05-01 NOTE — Telephone Encounter (Signed)
Pa for pt ( TRULICITY 0.75 MG /0.5ML ) came through on cover my meds .... Pa has already been done by [facility] CMA on 6/13 ... I made a copy of approval  letter that was scanned  and  I faxed it to the pharmacy PA is good until  01/17/2024

## 2023-05-01 NOTE — Patient Instructions (Addendum)
Thank you for the visit today!  Have fun with the freestyle libre 14 day CGM. Call me with questions.   We talked about preventing low blood sugar by eating carbs through out the day not skipping meals.  Let's follow up next year September 2025  Lupita Leash [phone]

## 2023-05-03 ENCOUNTER — Other Ambulatory Visit (HOSPITAL_COMMUNITY): Payer: Self-pay

## 2023-05-03 ENCOUNTER — Ambulatory Visit: Payer: Medicaid Other | Admitting: Physical Therapy

## 2023-05-03 ENCOUNTER — Encounter: Payer: Self-pay | Admitting: Physical Therapy

## 2023-05-03 DIAGNOSIS — M5431 Sciatica, right side: Secondary | ICD-10-CM

## 2023-05-03 DIAGNOSIS — M5459 Other low back pain: Secondary | ICD-10-CM

## 2023-05-03 DIAGNOSIS — M5432 Sciatica, left side: Secondary | ICD-10-CM

## 2023-05-03 DIAGNOSIS — M545 Low back pain, unspecified: Secondary | ICD-10-CM | POA: Diagnosis not present

## 2023-05-03 NOTE — Therapy (Signed)
OUTPATIENT PHYSICAL THERAPY THORACOLUMBAR EVALUATION   Patient Name: Regina Cantu MRN: [id number] DOB:Aug 10, 1980, 42 y.o., female Today's Date: 05/03/2023  END OF SESSION:  PT End of Session - 05/03/23 0805     Visit Number 3    Number of Visits 13    Date for PT Re-Evaluation 06/14/23    Authorization Type [place] MEDICAID [employer] [place]    PT Start Time 0800    PT Stop Time 0845    PT Time Calculation (min) 45 min    Activity Tolerance Patient tolerated treatment well    Behavior During Therapy [facility] for tasks assessed/performed              Past Medical History:  Diagnosis Date   Anxiety    Cancer (HCC)    ovarian 2001 and 2010   Depression    Essential hypertension 05/04/2022   Herniated disc, cervical 2011   Hypertension    Migraine    Pityriasis rosea    Uncontrolled type 2 diabetes mellitus with hyperglycemia (HCC) 05/04/2022   URI, acute 08/17/2022   Past Surgical History:  Procedure Laterality Date   ABDOMINAL HYSTERECTOMY     OVARIAN CANCER     Patient Active Problem List   Diagnosis Date Noted   Back pain 03/18/2023   Abnormal Pap smear of cervix 12/27/2022   Tobacco use 08/17/2022   Depression 08/17/2022   Healthcare maintenance 07/04/2022   Migraine 07/04/2022   History of CVA (cerebrovascular accident) 05/04/2022   History of ovarian cancer 05/04/2022   Uncontrolled type 2 diabetes mellitus with hyperglycemia (HCC) 05/04/2022   Essential hypertension 05/04/2022   Malignant (primary) neoplasm, unspecified (HCC) 05/24/2021   Other specified disorders of eustachian tube, bilateral 05/24/2021   Allergy, unspecified, initial encounter 05/24/2021    PCP: Chauncey Mann, DO  REFERRING PROVIDER: London Sheer, MD  REFERRING DIAG: M54.50 (ICD-10-CM) - Low back pain, unspecified back pain laterality, unspecified chronicity, unspecified whether sciatica presen   Rationale for Evaluation and Treatment: Rehabilitation  THERAPY  DIAG:  Other low back pain  Sciatica, left side  Sciatica, right side  ONSET DATE: 30 years ago  SUBJECTIVE:                                                                                                                                                                                           SUBJECTIVE STATEMENT: Shoulder is better today.  Legs are sore, walked 10,000 steps yesterday.  Back is ok. Just tired.   PERTINENT HISTORY:  High BMI, DM- uncontrolled, Stroke  PAIN:  Are you having pain? Yes: NPRS scale: 5/10. Pain  range on eval: 5-8/10 Pain location: Low back to hips and knees Pain description: Throbbing, achy Aggravating factors: Prolonged standing, walking, sitting Relieving factors: Stretching, ocasional medications  PRECAUTIONS: None  RED FLAGS: None   WEIGHT BEARING RESTRICTIONS: No  FALLS:  Has patient fallen in last 6 months?  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 To mange pain better.  NEXT MD VISIT: 10/7 Dr. Christell Constant  OBJECTIVE:   DIAGNOSTIC FINDINGS:  XRs of the lumbar spine from 04/01/2023 were independently reviewed and  interpreted, showing disc height loss at L4/5. No other significant  degenerative changes. No fracture or dislocation seen.  No evidence of  instability on flexion/extension views.  Lordotic alignment.   PATIENT SURVEYS:  FOTO: Perceived function 45%, predicted 52%   SCREENING FOR RED FLAGS: Bowel or bladder incontinence: No Spinal tumors: No Cauda equina syndrome: No Compression fracture: No  COGNITION: Overall cognitive status: Within functional limits for tasks assessed     SENSATION: WFL  MUSCLE LENGTH: Hamstrings: Right WNLs deg; Left WNLs  deg Maisie Fus test: Right Tight deg; Left Tight deg  POSTURE: rounded shoulders, forward head, and increased lumbar lordosis  PALPATION: Repot of pressure to midline low back, paraspinal, PSIs, and gluteals  LUMBAR ROM:   AROM eval  Flexion Full, relief, use hands to walk up to return to standing  Extension Markedly limited, pain  Right lateral flexion Min limited, no pain  Left lateral flexion Min limited, pain R  Right rotation Full, no pain  Left rotation Full, pain R   (Blank rows = not tested)  LOWER EXTREMITY ROM:     WNLs Active  Right eval Left eval  Hip flexion    Hip extension    Hip abduction    Hip adduction    Hip internal rotation    Hip external rotation    Knee flexion    Knee extension    Ankle dorsiflexion    Ankle plantarflexion    Ankle inversion    Ankle eversion     (Blank rows = not tested)  LOWER EXTREMITY MMT:    Grossly 4+ to 5/5 and equal. Myotome screen neg  Weak core MMT Right eval Left eval  Hip flexion    Hip extension    Hip abduction    Hip adduction    Hip internal rotation    Hip external rotation    Knee flexion    Knee extension    Ankle dorsiflexion    Ankle plantarflexion    Ankle inversion    Ankle eversion     (Blank rows = not tested)  LUMBAR SPECIAL TESTS:  Straight leg raise test: Negative, Slump test: Negative, SI Compression/distraction test: Negative, and FABER test: Negative  FUNCTIONAL TESTS:  NT  GAIT: Distance walked: 200' Assistive device utilized: None Level of assistance: Complete Independence Comments: WNLs  TODAY'S TREATMENT:        [facility] Adult PT Treatment:                                                DATE: 05/03/23 Therapeutic Exercise: NuStep LE  and UE for 6 min L4  Sit to stand 10 lbs KB x 10, 2 sets , added chest press  Standing calf raise 10 lbs High knees with unilateral 10 lbs KB suitcase hold x 10  Single knee to chest x 2 , 30 sec  Supine Tr A x 10  Supine march x 10 Bridge  x 10  Lower trunk rotation x 10   [facility] Adult PT Treatment:                                                DATE: 04/30/23 Therapeutic Exercise: Supine posterior pelvic tilt to neutral Tilt to bridge added green band x 10  Trunk rotation to reset Hooklying clam green band x 10 each side, unilateral  Knee to chest 30 sec x 3 Piriformis x 30 sec x 1 , not tight  Sit to stand 10 lbs x 15  Horizontal abduction unable with L shoulder  Standing row and extension green band x 10 Standing quad stretch  Standing hip flexor stretch  Palloff press green band x 15   Self Care: Popping joints, mobility vs flexibility      [facility] Adult PT Treatment:                                                DATE: 04/23/23 Therapeutic Exercise: Developed, instructed in, and pt completed therex as noted in HEP   PATIENT EDUCATION:  Education details: Eval findings, POC, HEP, self care  Person educated: Patient Education method: Explanation, Demonstration, Tactile cues, Verbal cues, and Handouts Education comprehension: verbalized understanding, returned demonstration, verbal cues required, tactile cues required, and needs further education  HOME EXERCISE PROGRAM: Access Code: [email or web address] URL: [email or web address] Date: 04/30/2023 Prepared by: Karie Mainland  Exercises - Supine Bridge  - 2 x daily - 7 x weekly - 1 sets - 10-15 reps - 5 hold - Supine Posterior Pelvic Tilt  - 2 x daily - 7 x weekly - 1 sets - 10-15 reps - 3 hold - Hooklying Clamshell with Resistance  - 2 x daily - 7 x weekly - 1 sets - 10-15 reps - 3 hold - Sit to Stand Without Arm Support  - 1 x daily - 7 x weekly - 2 sets - 10 reps - Standing Shoulder Row with Anchored Resistance  - 1 x daily - 7 x weekly - 2 sets - 10 reps - 5 hold - Shoulder extension with resistance - Neutral  - 1 x daily - 7 x weekly - 2 sets - 10 reps - 5 hold  ASSESSMENT:  CLINICAL IMPRESSION: Pt did well today with standing core.  Pt has some mild  balance deficits residual from CVA last year.  She has good focus with core work and can stabilize with only in min cues.  Cont POC for general core strength and postural training.    OBJECTIVE IMPAIRMENTS: difficulty walking, decreased ROM, decreased strength, obesity, and pain.   ACTIVITY LIMITATIONS: carrying, lifting, bending, sitting, standing, squatting, sleeping, bathing, reach over head, hygiene/grooming, and locomotion level  PARTICIPATION LIMITATIONS: meal prep, cleaning, laundry, shopping, community activity, occupation, and yard work  PERSONAL FACTORS: Fitness, Past/current experiences, Time since onset of injury/illness/exacerbation, and 1  comorbidity: high BMI  are also affecting patient's functional outcome.   REHAB POTENTIAL: Good  CLINICAL DECISION MAKING: Evolving/moderate complexity  EVALUATION COMPLEXITY: Moderate   GOALS:  SHORT TERM GOALS: Target date: 05/17/23  Pt will be Ind in an initial HEP  Baseline: started Goal status: INITIAL  2.  Pt will voice understanding of measures to assist in pain reduction Baseline: started Goal status: INITIAL  LONG TERM GOALS: Target date: 06/14/23  Pt will be Ind in a final HEP to maintain achieved LOF  Baseline:  Goal status: INITIAL  2.  Improve trunk extension ROM to a min limitation for improved function Baseline: markedly imited Goal status: INITIAL  3.  Improve back/core strength to where the pt can sustain a bridge for 60 sec and forward plank from toes for 30 sec Baseline:  Goal status: INITIAL  4.  Pt will report a 50% improvement in low back pain with daily and work related activities for improved function and QOL Baseline: 5-8/10 Goal status: INITIAL  5.  Pt's FOTO score will improved to the predicted value of 52% as indication of improved function  Baseline: 45% Goal status: INITIAL  PLAN:  PT FREQUENCY: 2x/week  PT DURATION: 6 weeks  PLANNED INTERVENTIONS: Therapeutic exercises, Therapeutic  activity, Neuromuscular re-education, Balance training, Gait training, Patient/Family education, Self Care, Joint mobilization, Aquatic Therapy, Dry Needling, Electrical stimulation, Spinal mobilization, Cryotherapy, Moist heat, Taping, Traction, Ultrasound, Ionotophoresis 4mg /ml Dexamethasone, Manual therapy, and Re-evaluation.  PLAN FOR NEXT SESSION: standing balance  and core/hip strength   Karie Mainland, PT 05/03/23 8:32 AM Phone: [phone] Fax: [phone]

## 2023-05-06 ENCOUNTER — Other Ambulatory Visit: Payer: Self-pay

## 2023-05-06 ENCOUNTER — Other Ambulatory Visit (HOSPITAL_COMMUNITY): Payer: Self-pay

## 2023-05-06 NOTE — Therapy (Signed)
OUTPATIENT PHYSICAL THERAPY THORACOLUMBAR EVALUATION   Patient Name: Khalessi Schlarb MRN: [id number] DOB:[birth year], 42 y.o., female Today's Date: 05/06/2023  END OF SESSION:     Past Medical History:  Diagnosis Date   Anxiety    Cancer (HCC)    ovarian 2001 and 2010   Depression    Essential hypertension 05/04/2022   Herniated disc, cervical 2011   Hypertension    Migraine    Pityriasis rosea    Uncontrolled type 2 diabetes mellitus with hyperglycemia (HCC) 05/04/2022   URI, acute 08/17/2022   Past Surgical History:  Procedure Laterality Date   ABDOMINAL HYSTERECTOMY     OVARIAN CANCER     Patient Active Problem List   Diagnosis Date Noted   Back pain 03/18/2023   Abnormal Pap smear of cervix 12/27/2022   Tobacco use 08/17/2022   Depression 08/17/2022   Healthcare maintenance 07/04/2022   Migraine 07/04/2022   History of CVA (cerebrovascular accident) 05/04/2022   History of ovarian cancer 05/04/2022   Uncontrolled type 2 diabetes mellitus with hyperglycemia (HCC) 05/04/2022   Essential hypertension 05/04/2022   Malignant (primary) neoplasm, unspecified (HCC) 05/24/2021   Other specified disorders of eustachian tube, bilateral 05/24/2021   Allergy, unspecified, initial encounter 05/24/2021    PCP: Chauncey Mann, DO  REFERRING PROVIDER: London Sheer, MD  REFERRING DIAG: M54.50 (ICD-10-CM) - Low back pain, unspecified back pain laterality, unspecified chronicity, unspecified whether sciatica presen   Rationale for Evaluation and Treatment: Rehabilitation  THERAPY DIAG:  No diagnosis found.  ONSET DATE: 30 years ago  SUBJECTIVE:                                                                                                                                                                                           SUBJECTIVE STATEMENT: Shoulder is better today.  Legs are sore, walked 10,000 steps yesterday.  Back is ok. Just tired.   PERTINENT  HISTORY:  High BMI, DM- uncontrolled, Stroke  PAIN:  Are you having pain? Yes: NPRS scale: 5/10. Pain range on eval: 5-8/10 Pain location: Low back to hips and knees Pain description: Throbbing, achy Aggravating factors: Prolonged standing, walking, sitting Relieving factors: Stretching, ocasional medications  PRECAUTIONS: None  RED FLAGS: None   WEIGHT BEARING RESTRICTIONS: No  FALLS:  Has patient fallen in last 6 months?  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 To mange pain better.  NEXT MD VISIT: 10/7 Dr. Christell Constant  OBJECTIVE:   DIAGNOSTIC FINDINGS:  XRs of the lumbar spine from 04/01/2023 were independently reviewed and  interpreted, showing disc height loss at L4/5. No other significant  degenerative changes. No fracture or dislocation seen.  No evidence of  instability on flexion/extension views.  Lordotic alignment.   PATIENT SURVEYS:  FOTO: Perceived function 45%, predicted 52%   SCREENING FOR RED FLAGS: Bowel or bladder incontinence: No Spinal tumors: No Cauda equina syndrome: No Compression fracture: No  COGNITION: Overall cognitive status: Within functional limits for tasks assessed     SENSATION: WFL  MUSCLE LENGTH: Hamstrings: Right WNLs deg; Left WNLs deg Maisie Fus test: Right Tight deg; Left Tight deg  POSTURE: rounded shoulders, forward head, and increased lumbar lordosis  PALPATION: Repot of pressure to midline low back, paraspinal, PSIs, and gluteals  LUMBAR ROM:   AROM eval  Flexion Full, relief, use hands to walk up to return to standing  Extension Markedly limited, pain  Right lateral flexion Min limited, no pain  Left lateral flexion Min limited, pain R  Right  rotation Full, no pain  Left rotation Full, pain R   (Blank rows = not tested)  LOWER EXTREMITY ROM:     WNLs Active  Right eval Left eval  Hip flexion    Hip extension    Hip abduction    Hip adduction    Hip internal rotation    Hip external rotation    Knee flexion    Knee extension    Ankle dorsiflexion    Ankle plantarflexion    Ankle inversion    Ankle eversion     (Blank rows = not tested)  LOWER EXTREMITY MMT:    Grossly 4+ to 5/5 and equal. Myotome screen neg  Weak core MMT Right eval Left eval  Hip flexion    Hip extension    Hip abduction    Hip adduction    Hip internal rotation    Hip external rotation    Knee flexion    Knee extension    Ankle dorsiflexion    Ankle plantarflexion    Ankle inversion    Ankle eversion     (Blank rows = not tested)  LUMBAR SPECIAL TESTS:  Straight leg raise test: Negative, Slump test: Negative, SI Compression/distraction test: Negative, and FABER test: Negative  FUNCTIONAL TESTS:  NT  GAIT: Distance walked: 200' Assistive device utilized: None Level of assistance: Complete Independence Comments: WNLs  TODAY'S TREATMENT:      [facility] Adult PT Treatment:                                                DATE: 05/07/23 Therapeutic Exercise: *** Manual Therapy: *** Neuromuscular re-ed: *** Therapeutic Activity: *** Modalities: *** Self Care: ***   [facility] Adult PT Treatment:                                                DATE: 05/03/23 Therapeutic Exercise: NuStep LE and UE for 6 min L4  Sit to stand 10 lbs KB x 10, 2 sets , added chest press  Standing calf raise 10 lbs High knees with unilateral  10 lbs KB suitcase hold x 10  Single knee to chest x 2 , 30 sec  Supine Tr A x 10  Supine march x 10 Bridge x 10  Lower trunk rotation x 10   [facility] Adult PT Treatment:                                                DATE: 04/30/23 Therapeutic Exercise: Supine posterior pelvic tilt to neutral Tilt to bridge added  green band x 10  Trunk rotation to reset Hooklying clam green band x 10 each side, unilateral  Knee to chest 30 sec x 3 Piriformis x 30 sec x 1 , not tight  Sit to stand 10 lbs x 15  Horizontal abduction unable with L shoulder  Standing row and extension green band x 10 Standing quad stretch  Standing hip flexor stretch  Palloff press green band x 15   Self Care: Popping joints, mobility vs flexibility      [facility] Adult PT Treatment:                                                DATE: 04/23/23 Therapeutic Exercise: Developed, instructed in, and pt completed therex as noted in HEP   PATIENT EDUCATION:  Education details: Eval findings, POC, HEP, self care  Person educated: Patient Education method: Explanation, Demonstration, Tactile cues, Verbal cues, and Handouts Education comprehension: verbalized understanding, returned demonstration, verbal cues required, tactile cues required, and needs further education  HOME EXERCISE PROGRAM: Access Code: [email or web address] URL: [email or web address] Date: 04/30/2023 Prepared by: Karie Mainland  Exercises - Supine Bridge  - 2 x daily - 7 x weekly - 1 sets - 10-15 reps - 5 hold - Supine Posterior Pelvic Tilt  - 2 x daily - 7 x weekly - 1 sets - 10-15 reps - 3 hold - Hooklying Clamshell with Resistance  - 2 x daily - 7 x weekly - 1 sets - 10-15 reps - 3 hold - Sit to Stand Without Arm Support  - 1 x daily - 7 x weekly - 2 sets - 10 reps - Standing Shoulder Row with Anchored Resistance  - 1 x daily - 7 x weekly - 2 sets - 10 reps - 5 hold - Shoulder extension with resistance - Neutral  - 1 x daily - 7 x weekly - 2 sets - 10 reps - 5 hold  ASSESSMENT:  CLINICAL IMPRESSION: Pt did well today with standing core.  Pt has some mild balance deficits residual from CVA last year.  She has good focus with core work and can stabilize with only in min cues.  Cont POC for general core strength and postural training.    OBJECTIVE IMPAIRMENTS:  difficulty walking, decreased ROM, decreased strength, obesity, and pain.   ACTIVITY LIMITATIONS: carrying, lifting, bending, sitting, standing, squatting, sleeping, bathing, reach over head, hygiene/grooming, and locomotion level  PARTICIPATION LIMITATIONS: meal prep, cleaning, laundry, shopping, community activity, occupation, and yard work  PERSONAL FACTORS: Fitness, Past/current experiences, Time since onset of injury/illness/exacerbation, and 1 comorbidity: high BMI  are also affecting patient's functional outcome.   REHAB POTENTIAL: Good  CLINICAL DECISION MAKING: Evolving/moderate complexity  EVALUATION COMPLEXITY: Moderate   GOALS:  SHORT TERM  GOALS: Target date: 05/17/23  Pt will be Ind in an initial HEP  Baseline: started Goal status: INITIAL  2.  Pt will voice understanding of measures to assist in pain reduction Baseline: started Goal status: INITIAL  LONG TERM GOALS: Target date: 06/14/23  Pt will be Ind in a final HEP to maintain achieved LOF  Baseline:  Goal status: INITIAL  2.  Improve trunk extension ROM to a min limitation for improved function Baseline: markedly imited Goal status: INITIAL  3.  Improve back/core strength to where the pt can sustain a bridge for 60 sec and forward plank from toes for 30 sec Baseline:  Goal status: INITIAL  4.  Pt will report a 50% improvement in low back pain with daily and work related activities for improved function and QOL Baseline: 5-8/10 Goal status: INITIAL  5.  Pt's FOTO score will improved to the predicted value of 52% as indication of improved function  Baseline: 45% Goal status: INITIAL  PLAN:  PT FREQUENCY: 2x/week  PT DURATION: 6 weeks  PLANNED INTERVENTIONS: Therapeutic exercises, Therapeutic activity, Neuromuscular re-education, Balance training, Gait training, Patient/Family education, Self Care, Joint mobilization, Aquatic Therapy, Dry Needling, Electrical stimulation, Spinal mobilization,  Cryotherapy, Moist heat, Taping, Traction, Ultrasound, Ionotophoresis 4mg /ml Dexamethasone, Manual therapy, and Re-evaluation.  PLAN FOR NEXT SESSION: standing balance  and core/hip strength   Abbygael Curtiss MS, PT 05/06/23 11:29 AM

## 2023-05-07 ENCOUNTER — Ambulatory Visit: Payer: Medicaid Other | Attending: Orthopedic Surgery

## 2023-05-07 DIAGNOSIS — M5432 Sciatica, left side: Secondary | ICD-10-CM | POA: Diagnosis not present

## 2023-05-07 DIAGNOSIS — M5459 Other low back pain: Secondary | ICD-10-CM | POA: Insufficient documentation

## 2023-05-07 DIAGNOSIS — M5431 Sciatica, right side: Secondary | ICD-10-CM | POA: Insufficient documentation

## 2023-05-08 ENCOUNTER — Ambulatory Visit: Payer: Medicaid Other | Admitting: Licensed Clinical Social Worker

## 2023-05-08 NOTE — Therapy (Unsigned)
OUTPATIENT PHYSICAL THERAPY THORACOLUMBAR EVALUATION   Patient Name: Casee Wurster MRN: [id number] DOB:1980/12/26, 42 y.o., female Today's Date: 05/08/2023  END OF SESSION:      Past Medical History:  Diagnosis Date   Anxiety    Cancer (HCC)    ovarian 2001 and 2010   Depression    Essential hypertension 05/04/2022   Herniated disc, cervical 2011   Hypertension    Migraine    Pityriasis rosea    Uncontrolled type 2 diabetes mellitus with hyperglycemia (HCC) 05/04/2022   URI, acute 08/17/2022   Past Surgical History:  Procedure Laterality Date   ABDOMINAL HYSTERECTOMY     OVARIAN CANCER     Patient Active Problem List   Diagnosis Date Noted   Back pain 03/18/2023   Abnormal Pap smear of cervix 12/27/2022   Tobacco use 08/17/2022   Depression 08/17/2022   Healthcare maintenance 07/04/2022   Migraine 07/04/2022   History of CVA (cerebrovascular accident) 05/04/2022   History of ovarian cancer 05/04/2022   Uncontrolled type 2 diabetes mellitus with hyperglycemia (HCC) 05/04/2022   Essential hypertension 05/04/2022   Malignant (primary) neoplasm, unspecified (HCC) 05/24/2021   Other specified disorders of eustachian tube, bilateral 05/24/2021   Allergy, unspecified, initial encounter 05/24/2021    PCP: Chauncey Mann, DO  REFERRING PROVIDER: London Sheer, MD  REFERRING DIAG: M54.50 (ICD-10-CM) - Low back pain, unspecified back pain laterality, unspecified chronicity, unspecified whether sciatica presen   Rationale for Evaluation and Treatment: Rehabilitation  THERAPY DIAG:  No diagnosis found.  ONSET DATE: 30 years ago  SUBJECTIVE:                                                                                                                                                                                           SUBJECTIVE STATEMENT: R knee is bothering her today, 4/10. Currently, therapy is going well. I feel like I'm getting  stronger.  PERTINENT HISTORY:  High BMI, DM- uncontrolled, Stroke  PAIN:  Are you having pain? Yes: NPRS scale: 5/10. Pain range on eval: 5-8/10 Pain location: Low back to hips and knees Pain description: Throbbing, achy Aggravating factors: Prolonged standing, walking, sitting Relieving factors: Stretching, ocasional medications  PRECAUTIONS: None  RED FLAGS: None   WEIGHT BEARING RESTRICTIONS: No  FALLS:  Has patient fallen in last 6 months?  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 To mange pain better.  NEXT MD VISIT: 10/7 Dr. Christell Constant  OBJECTIVE:   DIAGNOSTIC FINDINGS:  XRs of the lumbar spine from 04/01/2023 were independently reviewed and  interpreted, showing disc height loss at L4/5. No other significant  degenerative changes. No fracture or dislocation seen.  No evidence of  instability on flexion/extension views.  Lordotic alignment.   PATIENT SURVEYS:  FOTO: Perceived function 45%, predicted 52%   SCREENING FOR RED FLAGS: Bowel or bladder incontinence: No Spinal tumors: No Cauda equina syndrome: No Compression fracture: No  COGNITION: Overall cognitive status: Within functional limits for tasks assessed     SENSATION: WFL  MUSCLE LENGTH: Hamstrings: Right WNLs deg; Left WNLs deg Maisie Fus test: Right Tight deg; Left Tight deg  POSTURE: rounded shoulders, forward head, and increased lumbar lordosis  PALPATION: Repot of pressure to midline low back, paraspinal, PSIs, and gluteals  LUMBAR ROM:   AROM eval  Flexion Full, relief, use hands to walk up to return to standing  Extension Markedly limited, pain  Right lateral flexion Min limited, no pain  Left lateral flexion Min  limited, pain R  Right rotation Full, no pain  Left rotation Full, pain R   (Blank rows = not tested)  LOWER EXTREMITY ROM:     WNLs Active  Right eval Left eval  Hip flexion    Hip extension    Hip abduction    Hip adduction    Hip internal rotation    Hip external rotation    Knee flexion    Knee extension    Ankle dorsiflexion    Ankle plantarflexion    Ankle inversion    Ankle eversion     (Blank rows = not tested)  LOWER EXTREMITY MMT:    Grossly 4+ to 5/5 and equal. Myotome screen neg  Weak core MMT Right eval Left eval  Hip flexion    Hip extension    Hip abduction    Hip adduction    Hip internal rotation    Hip external rotation    Knee flexion    Knee extension    Ankle dorsiflexion    Ankle plantarflexion    Ankle inversion    Ankle eversion     (Blank rows = not tested)  LUMBAR SPECIAL TESTS:  Straight leg raise test: Negative, Slump test: Negative, SI Compression/distraction test: Negative, and FABER test: Negative  FUNCTIONAL TESTS:  NT  GAIT: Distance walked: 200' Assistive device utilized: None Level of assistance: Complete Independence Comments: WNLs  TODAY'S TREATMENT:       [facility] Adult PT Treatment:                                                DATE: 05/09/23 Therapeutic Exercise: *** Manual Therapy: *** Neuromuscular re-ed: *** Therapeutic Activity: *** Modalities: *** Self Care: ***  [facility] Adult PT Treatment:                                                DATE: 05/07/23 Therapeutic Exercise: NuStep LE and UE for 6 min L4  Single knee to chest x 2 , 30 sec  Supine Tr A x 10  Supine march x 10 Supine abdominal bracing 5",  x10 Bridge x 10  Lower trunk rotation x 10  Seated hamstring stretch x2  30" Seated trunk flexion stretchx2 30" Sit to stand 10 lbs KB x 10 c chest press  Standing calf raise 10 lbs High knees with unilateral 10 lbs KB suitcase hold x 10   [facility] Adult PT Treatment:                                                 DATE: 05/03/23 Therapeutic Exercise: NuStep LE and UE for 6 min L4  Sit to stand 10 lbs KB x 10, 2 sets , added chest press  Standing calf raise 10 lbs High knees with unilateral 10 lbs KB suitcase hold x 10  Single knee to chest x 2 , 30 sec  Supine Tr A x 10  Supine march x 10 Bridge x 10  Lower trunk rotation x 10   [facility] Adult PT Treatment:                                                DATE: 04/30/23 Therapeutic Exercise: Supine posterior pelvic tilt to neutral Tilt to bridge added green band x 10  Trunk rotation to reset Hooklying clam green band x 10 each side, unilateral  Knee to chest 30 sec x 3 Piriformis x 30 sec x 1 , not tight  Sit to stand 10 lbs x 15  Horizontal abduction unable with L shoulder  Standing row and extension green band x 10 Standing quad stretch  Standing hip flexor stretch  Palloff press green band x 15   Self Care: Popping joints, mobility vs flexibility      [facility] Adult PT Treatment:                                                DATE: 04/23/23 Therapeutic Exercise: Developed, instructed in, and pt completed therex as noted in HEP   PATIENT EDUCATION:  Education details: Eval findings, POC, HEP, self care  Person educated: Patient Education method: Explanation, Demonstration, Tactile cues, Verbal cues, and Handouts Education comprehension: verbalized understanding, returned demonstration, verbal cues required, tactile cues required, and needs further education  HOME EXERCISE PROGRAM: Access Code: [email or web address] URL: [email or web address] Date: 04/30/2023 Prepared by: Karie Mainland  Exercises - Supine Bridge  - 2 x daily - 7 x weekly - 1 sets - 10-15 reps - 5 hold - Supine Posterior Pelvic Tilt  - 2 x daily - 7 x weekly - 1 sets - 10-15 reps - 3 hold - Hooklying Clamshell with Resistance  - 2 x daily - 7 x weekly - 1 sets - 10-15 reps - 3 hold - Sit to Stand Without Arm Support  - 1 x daily - 7 x weekly - 2 sets - 10  reps - Standing Shoulder Row with Anchored Resistance  - 1 x daily - 7 x weekly - 2 sets - 10 reps - 5 hold - Shoulder extension with resistance - Neutral  - 1 x daily - 7 x weekly - 2 sets - 10 reps - 5 hold  ASSESSMENT:  CLINICAL IMPRESSION: Pt reports she is pleased c PT, feels like she is getting stronger. Pt reports consistent completion of her HEP. PT was completed for lumbopelvic strengthening and flexibility. Flexibility was completed with a flexion bias. Pt tolerated the prescribed therex without adverse effects. Will progress strengthening therex as tolerated and modify HEP the next session  OBJECTIVE IMPAIRMENTS: difficulty walking, decreased ROM, decreased strength, obesity, and pain.   ACTIVITY LIMITATIONS: carrying, lifting, bending, sitting, standing, squatting, sleeping, bathing, reach over head, hygiene/grooming, and locomotion level  PARTICIPATION LIMITATIONS: meal prep, cleaning, laundry, shopping, community activity, occupation, and yard work  PERSONAL FACTORS: Fitness, Past/current experiences, Time since onset of injury/illness/exacerbation, and 1 comorbidity: high BMI  are also affecting patient's functional outcome.   REHAB POTENTIAL: Good  CLINICAL DECISION MAKING: Evolving/moderate complexity  EVALUATION COMPLEXITY: Moderate   GOALS:  SHORT TERM GOALS: Target date: 05/17/23  Pt will be Ind in an initial HEP  Baseline: started Goal status: INITIAL  2.  Pt will voice understanding of measures to assist in pain reduction Baseline: started Goal status: INITIAL  LONG TERM GOALS: Target date: 06/14/23  Pt will be Ind in a final HEP to maintain achieved LOF  Baseline:  Goal status: INITIAL  2.  Improve trunk extension ROM to a min limitation for improved function Baseline: markedly imited Goal status: INITIAL  3.  Improve back/core strength to where the pt can sustain a bridge for 60 sec and forward plank from toes for 30 sec Baseline:  Goal status:  INITIAL  4.  Pt will report a 50% improvement in low back pain with daily and work related activities for improved function and QOL Baseline: 5-8/10 Goal status: INITIAL  5.  Pt's FOTO score will improved to the predicted value of 52% as indication of improved function  Baseline: 45% Goal status: INITIAL  PLAN:  PT FREQUENCY: 2x/week  PT DURATION: 6 weeks  PLANNED INTERVENTIONS: Therapeutic exercises, Therapeutic activity, Neuromuscular re-education, Balance training, Gait training, Patient/Family education, Self Care, Joint mobilization, Aquatic Therapy, Dry Needling, Electrical stimulation, Spinal mobilization, Cryotherapy, Moist heat, Taping, Traction, Ultrasound, Ionotophoresis 4mg /ml Dexamethasone, Manual therapy, and Re-evaluation.  PLAN FOR NEXT SESSION: standing balance  and core/hip strength   Allen Ralls MS, PT 05/08/23 2:07 PM

## 2023-05-09 ENCOUNTER — Ambulatory Visit: Payer: Medicaid Other | Admitting: Physical Therapy

## 2023-05-09 ENCOUNTER — Encounter: Payer: Self-pay | Admitting: Physical Therapy

## 2023-05-09 DIAGNOSIS — M5432 Sciatica, left side: Secondary | ICD-10-CM

## 2023-05-09 DIAGNOSIS — M5459 Other low back pain: Secondary | ICD-10-CM | POA: Diagnosis not present

## 2023-05-09 DIAGNOSIS — M5431 Sciatica, right side: Secondary | ICD-10-CM

## 2023-05-13 ENCOUNTER — Ambulatory Visit (INDEPENDENT_AMBULATORY_CARE_PROVIDER_SITE_OTHER): Payer: Medicaid Other | Admitting: Licensed Clinical Social Worker

## 2023-05-13 ENCOUNTER — Ambulatory Visit (INDEPENDENT_AMBULATORY_CARE_PROVIDER_SITE_OTHER): Payer: Medicaid Other | Admitting: Orthopedic Surgery

## 2023-05-13 DIAGNOSIS — F330 Major depressive disorder, recurrent, mild: Secondary | ICD-10-CM | POA: Diagnosis not present

## 2023-05-13 DIAGNOSIS — M545 Low back pain, unspecified: Secondary | ICD-10-CM

## 2023-05-13 DIAGNOSIS — G8929 Other chronic pain: Secondary | ICD-10-CM | POA: Diagnosis not present

## 2023-05-13 NOTE — BH Specialist Note (Signed)
Integrated Behavioral Health via Telemedicine Visit  05/13/2023 Regina Cantu [id number]  Number of Integrated Behavioral Health Clinician visits: Additional Visit  Session Start time: 1430   Session End time: 1530  Total time in minutes: 60   Referring Provider: PCP Patient/Family location: Home [facility] Provider location: Office All persons participating in visit: [facility] and Patient Types of Service: Individual psychotherapy  I connected with Regina Cantu Telephone and verified that I am speaking with the correct person using two identifiers. Discussed confidentiality: Yes   I discussed the limitations of telemedicine and the availability of in person appointments.  Discussed there is a possibility of technology failure and discussed alternative modes of communication if that failure occurs.  I discussed that engaging in this telemedicine visit, they consent to the provision of behavioral healthcare and the services will be billed under their insurance.  Patient and/or legal guardian expressed understanding and consented to Telemedicine visit: Yes   Presenting Concerns: Patient and/or family reports the following symptoms/concerns: The patient is being discharged following a collaborative decision between the patient and the therapist, LCSW-A. Throughout treatment, the patient demonstrated significant engagement and progress. The patient will continue with weekly therapy at the [facility] with therapist Wyatt Mage and has a peer support person named Tammy Sours to assist in her recovery journey. Additionally, the patient is under the care of a psychiatrist for ongoing medication management. The discharge plan includes continued participation in therapy, regular psychiatric follow-ups, and support from her peer mentor to ensure stability and safety post-discharge.   Patient and/or Family's Strengths/Protective Factors: Concrete supports in place (healthy food, safe environments,  etc.) and Sense of purpose  Goals Addressed: Patient will:  Reduce symptoms of: anxiety and depression   Increase knowledge and/or ability of: coping skills   Demonstrate ability to: Increase healthy adjustment to current life circumstances  Progress towards Goals: Achieved  Interventions: Interventions utilized:  CBT Cognitive Behavioral Therapy   Patient and/or Family Response: Patient is in current services with [facility]  Assessment: Patient was discharged on today and will continue intensive services with [facility]..     I discussed the assessment and treatment plan with the patient and/or parent/guardian. They were provided an opportunity to ask questions and all were answered. They agreed with the plan and demonstrated an understanding of the instructions.   Christen Butter, MSW, LCSW-A She/Her Behavioral Health Clinician [facility]  Internal Medicine Center Direct [phone]  Fax [phone] Main Office Phone: [phone] [address] [place] [place], [place] [address] Website: [facility] Internal [facility]  [place], [place]  [place]

## 2023-05-13 NOTE — Progress Notes (Signed)
Orthopedic Spine Surgery Office Note   Assessment: Patient is a 42 y.o. female with chronic low back pain. Has some physical exam findings point to SI joints as possible etiology     Plan: -Patient has tried chiropractor, ibuprofen, PT -Since she has noted some improvement with PT and home exercises, encouraged her to continue to continue with these -If her pain does not continue to improve or gets worse, would consider diagnostic/therapeutic SI joint injections -Would need to be nicotine free prior to any elective spine surgery -Patient should return to office in 3 months, x-rays at next visit: none     Patient expressed understanding of the plan and all questions were answered to the patient's satisfaction.    ___________________________________________________________________________     History:   Patient is a 42 y.o. female who presents today for  follow up on her lumbar spine.  To recap, patient has had 30 years of low back pain that had gotten worse within the last year.  She has had no radiating pain into either lower extremity.  After last visit, she started physical therapy and doing home exercises.  She has noted improvement in her pain.  It has been slow and gradual but improving.  She has not developed any new symptoms since the last time she was seen.  Of note, her partner and father of her children is currently incarcerated.  She also has a restraining order against him.  She has been dealing with a lot of stress recently and has an upcoming court date.   Treatments tried: Land, ibuprofen, PT    Physical Exam:   General: no acute distress, appears stated age Neurologic: alert, answering questions appropriately, following commands Respiratory: unlabored breathing on room air, symmetric chest rise Psychiatric: appropriate affect, normal cadence to speech     MSK (spine):   -Strength exam                                                   Left                   Right EHL                              5/5                  5/5 TA                                 5/5                  5/5 GSC                             5/5                  5/5 Knee extension            5/5                  5/5 Hip flexion                    5/5  5/5   -Sensory exam                           Sensation intact to light touch in L3-S1 nerve distributions of bilateral lower extremities   -Straight leg raise: negative bilaterally -Clonus: no beats bilaterally   -Left hip exam: Negative FADIR, no pain through range of motion, TTP over the SI joint, positive Faber, positive SI joint compression test -Right hip exam: Negative FADIR, no pain through range of motion, TTP over the SI joint, positive Faber, positive SI joint compression test   Imaging: XRs of the lumbar spine from 04/01/2023 were previously independently reviewed and interpreted, showing disc height loss at L4/5. No other significant degenerative changes. No fracture or dislocation seen.  No evidence of instability on flexion/extension views.  Lordotic alignment.     Patient name: Regina Cantu Patient MRN: [id number] Date of visit: 05/13/23

## 2023-05-17 ENCOUNTER — Other Ambulatory Visit (HOSPITAL_COMMUNITY): Payer: Self-pay

## 2023-05-21 NOTE — Therapy (Unsigned)
OUTPATIENT PHYSICAL THERAPY THORACOLUMBAR  PROGRESS NOTE   Patient Name: Regina Cantu MRN: [id number] DOB:06/06/1981, 42 y.o., female Today's Date: 05/22/2023  END OF SESSION:  PT End of Session - 05/22/23 0841     Visit Number 6    Number of Visits 13    Date for PT Re-Evaluation 06/14/23    Authorization Type  [employer]     PT Start Time 0841    PT Stop Time 0930    PT Time Calculation (min) 49 min    Activity Tolerance Patient tolerated treatment well                 Past Medical History:  Diagnosis Date   Anxiety    Cancer (HCC)    ovarian 2001 and 2010   Depression    Essential hypertension 05/04/2022   Herniated disc, cervical 2011   Hypertension    Migraine    Pityriasis rosea    Uncontrolled type 2 diabetes mellitus with hyperglycemia (HCC) 05/04/2022   URI, acute 08/17/2022   Past Surgical History:  Procedure Laterality Date   ABDOMINAL HYSTERECTOMY     OVARIAN CANCER     Patient Active Problem List   Diagnosis Date Noted   Back pain 03/18/2023   Abnormal Pap smear of cervix 12/27/2022   Tobacco use 08/17/2022   Depression 08/17/2022   Healthcare maintenance 07/04/2022   Migraine 07/04/2022   History of CVA (cerebrovascular accident) 05/04/2022   History of ovarian cancer 05/04/2022   Uncontrolled type 2 diabetes mellitus with hyperglycemia (HCC) 05/04/2022   Essential hypertension 05/04/2022   Malignant (primary) neoplasm, unspecified (HCC) 05/24/2021   Other specified disorders of eustachian tube, bilateral 05/24/2021   Allergy, unspecified, initial encounter 05/24/2021    PCP: Chauncey Mann, DO  REFERRING PROVIDER: London Sheer, MD  REFERRING DIAG: M54.50 (ICD-10-CM) - Low back pain, unspecified back pain laterality, unspecified chronicity, unspecified whether sciatica presen   Rationale for Evaluation and Treatment: Rehabilitation  THERAPY DIAG:  Other low back pain  Sciatica, left  side  Sciatica, right side  ONSET DATE: 30 years ago  SUBJECTIVE:                                                                                                                                                                                           SUBJECTIVE STATEMENT: Pt saw doctor who recommended she continue PT and can get injection if needed. Pain reported a 8/10 today.  She reports less LE pain since beginning PT. She stretches at work back of her legs on the curb.   PERTINENT HISTORY:  High  BMI, DM- uncontrolled, Stroke  PAIN:  Are you having pain? Yes: NPRS scale: 8/10. Pain range on eval: 5-8/10 Pain location: Low back to hips and bilateral knees Pain description: Throbbing, achy Aggravating factors: Prolonged standing, walking, sitting Relieving factors: Stretching, ocasional medications  PRECAUTIONS: None  RED FLAGS: None   WEIGHT BEARING RESTRICTIONS: No  FALLS:  Has patient fallen in last 6 months?  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 To mange pain better.  NEXT MD VISIT: 10/7 Dr. Christell Constant  OBJECTIVE:   DIAGNOSTIC FINDINGS:  XRs of the lumbar spine from 04/01/2023 were independently reviewed and  interpreted, showing disc height loss at L4/5. No other significant  degenerative changes. No fracture or dislocation seen.  No evidence of  instability on flexion/extension views.  Lordotic alignment.   PATIENT SURVEYS:  FOTO: Perceived function 45%, predicted 52%   SCREENING FOR RED FLAGS: Bowel or bladder incontinence: No Spinal tumors: No Cauda equina syndrome: No Compression fracture: No  COGNITION: Overall cognitive status: Within functional limits for tasks  assessed     SENSATION: WFL  MUSCLE LENGTH: Hamstrings: Right WNLs deg; Left WNLs deg Maisie Fus test: Right Tight deg; Left Tight deg  POSTURE: rounded shoulders, forward head, and increased lumbar lordosis  PALPATION: Repot of pressure to midline low back, paraspinal, PSIs, and gluteals  LUMBAR ROM:   AROM eval  Flexion Full, relief, use hands to walk up to return to standing  Extension Markedly limited, pain  Right lateral flexion Min limited, no pain  Left lateral flexion Min limited, pain R  Right rotation Full, no pain  Left rotation Full, pain R   (Blank rows = not tested)  LOWER EXTREMITY ROM:     WNLs Active  Right eval Left eval  Hip flexion    Hip extension    Hip abduction    Hip adduction    Hip internal rotation    Hip external rotation    Knee flexion    Knee extension    Ankle dorsiflexion    Ankle plantarflexion    Ankle inversion    Ankle eversion     (Blank rows = not tested)  LOWER EXTREMITY MMT:    Grossly 4+ to 5/5 and equal. Myotome screen neg  Weak core MMT Right eval Left eval  Hip flexion    Hip extension    Hip abduction    Hip adduction    Hip internal rotation    Hip external rotation    Knee flexion    Knee extension    Ankle dorsiflexion    Ankle plantarflexion    Ankle inversion    Ankle eversion     (Blank rows = not tested)  LUMBAR SPECIAL TESTS:  Straight leg raise test: Negative, Slump test: Negative, SI Compression/distraction test: Negative, and FABER test: Negative  FUNCTIONAL TESTS:  NT  GAIT: Distance walked: 200' Assistive device utilized: None Level of assistance: Complete Independence Comments: WNLs  TODAY'S TREATMENT:        [facility] Adult PT Treatment:  DATE: 05/22/23 Therapeutic Exercise: NuStep L5 UE and LE for 6 min  Supine full body extension  Supine Posterior Pelvic Tilt  x 10  Supine Bridge x 10 Hooklying bent knee fall out x 10 each  Supine  90/90 with knee ext x 10 each  Sidelying hip abd x 10  Sidelying clam x  15  Calf stretch on wall x 2  Standing Shoulder Row with Anchored Resistance  springboard 2 x 15  Shoulder extension with resistance x  15 , added leg lift to SLS     [facility] Adult PT Treatment:                                                DATE: 05/09/23 Therapeutic Exercise: NuStep 5 min L4 UE and LE Physio ball supine on mat: hamstring curl Lower trunk rotation x 10 Bridge with ball x 10  SLR from ball x 10  Green band x 15 double leg fall out Green banded bridge x 15 Combo bridge with clam (small ROM )  Sit to stand with band on thighs x 10  Full trunk flexion seated  Row, extension and horizontal abd green band x 15    [facility] Adult PT Treatment:                                                DATE: 05/07/23 Therapeutic Exercise: NuStep LE and UE for 6 min L4  Single knee to chest x 2 , 30 sec  Supine Tr A x 10  Supine march x 10 Supine abdominal bracing 5", x10 Bridge x 10  Lower trunk rotation x 10  Seated hamstring stretch x2  30" Seated trunk flexion stretchx2 30" Sit to stand 10 lbs KB x 10 c chest press  Standing calf raise 10 lbs High knees with unilateral 10 lbs KB suitcase hold x 10    PATIENT EDUCATION:  Education details: Eval findings, POC, HEP, self care  Person educated: Patient Education method: Explanation, Demonstration, Tactile cues, Verbal cues, and Handouts Education comprehension: verbalized understanding, returned demonstration, verbal cues required, tactile cues required, and needs further education  HOME EXERCISE PROGRAM:  Access Code: [email or web address] URL: [email or web address] Date: 05/22/2023 Prepared by: Karie Mainland  Exercises - Supine Bridge  - 2 x daily - 7 x weekly - 1 sets - 10-15 reps - 5 hold - Supine Posterior Pelvic Tilt  - 2 x daily - 7 x weekly - 1 sets - 10-15 reps - 3 hold - Hooklying Clamshell with Resistance  - 2 x daily - 7 x weekly - 1 sets  - 10-15 reps - 3 hold - Sit to Stand Without Arm Support  - 1 x daily - 7 x weekly - 2 sets - 10 reps - Standing Shoulder Row with Anchored Resistance  - 1 x daily - 7 x weekly - 2 sets - 10 reps - 5 hold - Shoulder extension with resistance - Neutral  - 1 x daily - 7 x weekly - 2 sets - 10 reps - 5 hold - Beginner Clam  - 1 x daily - 7 x weekly - 2 sets - 10 reps - 5 hold - Sidelying Hip Abduction  - 1 x  daily - 7 x weekly - 2 sets - 10 reps - 5 hold    ASSESSMENT:  CLINICAL IMPRESSION: Pt able to do her exercises without increasing pain . She needed cues to maintain core contraction throughout exercises.  Pain levels are up today but does not appear to be this high with functional exercises. She is good at incorporating her exercise into her daily activities at work.  Added to HEP today.   OBJECTIVE IMPAIRMENTS: difficulty walking, decreased ROM, decreased strength, obesity, and pain.   ACTIVITY LIMITATIONS: carrying, lifting, bending, sitting, standing, squatting, sleeping, bathing, reach over head, hygiene/grooming, and locomotion level  PARTICIPATION LIMITATIONS: meal prep, cleaning, laundry, shopping, community activity, occupation, and yard work  PERSONAL FACTORS: Fitness, Past/current experiences, Time since onset of injury/illness/exacerbation, and 1 comorbidity: high BMI  are also affecting patient's functional outcome.   REHAB POTENTIAL: Good  CLINICAL DECISION MAKING: Evolving/moderate complexity  EVALUATION COMPLEXITY: Moderate   GOALS:  SHORT TERM GOALS: Target date: 05/17/23  Pt will be Ind in an initial HEP  Baseline: 10/3: min cues  Goal status: ongoing   2.  Pt will voice understanding of measures to assist in pain reduction Baseline: started Goal status: MET   LONG TERM GOALS: Target date: 06/14/23  Pt will be Ind in a final HEP to maintain achieved LOF  Baseline:  Goal status: INITIAL  2.  Improve trunk extension ROM to a min limitation for improved  function Baseline: markedly imited Goal status: INITIAL  3.  Improve back/core strength to where the pt can sustain a bridge for 60 sec and forward plank from toes for 30 sec Baseline:  Goal status: INITIAL  4.  Pt will report a 50% improvement in low back pain with daily and work related activities for improved function and QOL Baseline: 5-8/10 Goal status: INITIAL  5.  Pt's FOTO score will improved to the predicted value of 52% as indication of improved function  Baseline: 45% Goal status: INITIAL  PLAN:  PT FREQUENCY: 2x/week  PT DURATION: 6 weeks  PLANNED INTERVENTIONS: Therapeutic exercises, Therapeutic activity, Neuromuscular re-education, Balance training, Gait training, Patient/Family education, Self Care, Joint mobilization, Aquatic Therapy, Dry Needling, Electrical stimulation, Spinal mobilization, Cryotherapy, Moist heat, Taping, Traction, Ultrasound, Ionotophoresis 4mg /ml Dexamethasone, Manual therapy, and Re-evaluation.  PLAN FOR NEXT SESSION: standing balance and core/hip strength  Karie Mainland, PT 05/22/23 8:50 AM Phone: [phone] Fax: [phone]

## 2023-05-22 ENCOUNTER — Encounter: Payer: Self-pay | Admitting: Physical Therapy

## 2023-05-22 ENCOUNTER — Ambulatory Visit: Payer: Medicaid Other | Admitting: Physical Therapy

## 2023-05-22 DIAGNOSIS — M5432 Sciatica, left side: Secondary | ICD-10-CM

## 2023-05-22 DIAGNOSIS — M5459 Other low back pain: Secondary | ICD-10-CM | POA: Diagnosis not present

## 2023-05-22 DIAGNOSIS — M5431 Sciatica, right side: Secondary | ICD-10-CM | POA: Diagnosis not present

## 2023-05-23 NOTE — Therapy (Signed)
OUTPATIENT PHYSICAL THERAPY THORACOLUMBAR NOTE   Patient Name: Regina Cantu MRN: [id number] DOB:[birth year], 42 y.o., female Today's Date: 05/24/2023  END OF SESSION:  PT End of Session - 05/24/23 0803     Visit Number 7    Number of Visits 13    Date for PT Re-Evaluation 06/14/23    Authorization Type [place] MEDICAID [employer] CARIT[employer] OF [place]    PT Start Time 0800    PT Stop Time 0845    PT Time Calculation (min) 45 min    Activity Tolerance Patient tolerated treatment well    Behavior During Therapy WFL for tasks assessed/performed                  Past Medical History:  Diagnosis Date   Anxiety    Cancer (HCC)    ovarian 2001 and 2010   Depression    Essential hypertension 05/04/2022   Herniated disc, cervical 2011   Hypertension    Migraine    Pityriasis rosea    Uncontrolled type 2 diabetes mellitus with hyperglycemia (HCC) 05/04/2022   URI, acute 08/17/2022   Past Surgical History:  Procedure Laterality Date   ABDOMINAL HYSTERECTOMY     OVARIAN CANCER     Patient Active Problem List   Diagnosis Date Noted   Back pain 03/18/2023   Abnormal Pap smear of cervix 12/27/2022   Tobacco use 08/17/2022   Depression 08/17/2022   Healthcare maintenance 07/04/2022   Migraine 07/04/2022   History of CVA (cerebrovascular accident) 05/04/2022   History of ovarian cancer 05/04/2022   Uncontrolled type 2 diabetes mellitus with hyperglycemia (HCC) 05/04/2022   Essential hypertension 05/04/2022   Malignant (primary) neoplasm, unspecified (HCC) 05/24/2021   Other specified disorders of eustachian tube, bilateral 05/24/2021   Allergy, unspecified, initial encounter 05/24/2021    PCP: Chauncey Mann, DO  REFERRING PROVIDER: London Sheer, MD  REFERRING DIAG: M54.50 (ICD-10-CM) - Low back pain, unspecified back pain laterality, unspecified chronicity, unspecified whether sciatica presen   Rationale for Evaluation and Treatment: Rehabilitation  THERAPY  DIAG:  Other low back pain  Sciatica, left side  Sciatica, right side  ONSET DATE: 30 years ago  SUBJECTIVE:                                                                                                                                                                                           SUBJECTIVE STATEMENT: Pt is stiff today.  She reports she may have slept in a weird position. Made a cot on top of the pillows. Back is 7/10. Pt reports less leg pain overall,especially at night, legs not  as restless.   PERTINENT HISTORY:  High BMI, DM- uncontrolled, Stroke  PAIN:  Are you having pain? Yes: NPRS scale: 8/10. Pain range on eval: 5-8/10 Pain location: Low back to hips and bilateral knees Pain description: Throbbing, achy Aggravating factors: Prolonged standing, walking, sitting Relieving factors: Stretching, ocasional medications  PRECAUTIONS: None  RED FLAGS: None   WEIGHT BEARING RESTRICTIONS: No  FALLS:  Has patient fallen in last 6 months?  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 To mange pain better.  NEXT MD VISIT: 10/7 Dr. Christell Constant  OBJECTIVE:   DIAGNOSTIC FINDINGS:  XRs of the lumbar spine from 04/01/2023 were independently reviewed and  interpreted, showing disc height loss at L4/5. No other significant  degenerative changes. No fracture or dislocation seen.  No evidence of  instability on flexion/extension views.  Lordotic alignment.   PATIENT SURVEYS:  FOTO: Perceived function 45%, predicted 52%   SCREENING FOR RED FLAGS: Bowel or bladder incontinence: No Spinal tumors: No Cauda equina syndrome: No Compression fracture: No  COGNITION: Overall cognitive status: Within  functional limits for tasks assessed     SENSATION: WFL  MUSCLE LENGTH: Hamstrings: Right WNLs deg; Left WNLs deg Maisie Fus test: Right Tight deg; Left Tight deg  POSTURE: rounded shoulders, forward head, and increased lumbar lordosis  PALPATION: Repot of pressure to midline low back, paraspinal, PSIs, and gluteals  LUMBAR ROM:   AROM eval  Flexion Full, relief, use hands to walk up to return to standing  Extension Markedly limited, pain  Right lateral flexion Min limited, no pain  Left lateral flexion Min limited, pain R  Right rotation Full, no pain  Left rotation Full, pain R   (Blank rows = not tested)  LOWER EXTREMITY ROM:     WNLs Active  Right eval Left eval  Hip flexion    Hip extension    Hip abduction    Hip adduction    Hip internal rotation    Hip external rotation    Knee flexion    Knee extension    Ankle dorsiflexion    Ankle plantarflexion    Ankle inversion    Ankle eversion     (Blank rows = not tested)  LOWER EXTREMITY MMT:    Grossly 4+ to 5/5 and equal. Myotome screen neg  Weak core MMT Right eval Left eval  Hip flexion    Hip extension    Hip abduction    Hip adduction    Hip internal rotation    Hip external rotation    Knee flexion    Knee extension    Ankle dorsiflexion    Ankle plantarflexion    Ankle inversion    Ankle eversion     (Blank rows = not tested)  LUMBAR SPECIAL TESTS:  Straight leg raise test: Negative, Slump test: Negative, SI Compression/distraction test: Negative, and FABER test: Negative  FUNCTIONAL TESTS:  NT  GAIT: Distance walked: 200' Assistive device utilized: None Level of assistance: Complete Independence Comments: WNLs  TODAY'S TREATMENT:       [facility] Adult PT Treatment:  DATE: 05/24/23 Therapeutic Exercise: NuStep L6 UE and LE for 6 min  Supine neutral core  Supine A/P tilt with ball  Supine march , knee ext,  Lat pull over, horizontal abd  unilateral x 6 lbs Bridge with ball x 10 Gait with 15 lbs KB suitcase carry: increased pain when wgt on L side in L UE, leans more with this as well Sidelying stretch to L trunk, leg off table  Childs pose, crosses knees for comfort Manual Therapy: Manual overpressure to sidelying stretch (iliac crest/pelvis) brief     [facility] Adult PT Treatment:                                                DATE: 05/22/23 Therapeutic Exercise: NuStep L5 UE and LE for 6 min  Supine full body extension  Supine Posterior Pelvic Tilt  x 10  Supine Bridge x 10 Hooklying bent knee fall out x 10 each  Supine 90/90 with knee ext x 10 each  Sidelying hip abd x 10  Sidelying clam x  15  Calf stretch on wall x 2  Standing Shoulder Row with Anchored Resistance  springboard 2 x 15  Shoulder extension with resistance x  15 , added leg lift to SLS     [facility] Adult PT Treatment:                                                DATE: 05/09/23 Therapeutic Exercise: NuStep 5 min L4 UE and LE Physio ball supine on mat: hamstring curl Lower trunk rotation x 10 Bridge with ball x 10  SLR from ball x 10  Green band x 15 double leg fall out Green banded bridge x 15 Combo bridge with clam (small ROM )  Sit to stand with band on thighs x 10  Full trunk flexion seated  Row, extension and horizontal abd green band x 15       PATIENT EDUCATION:  Education details: Eval findings, POC, HEP, self care  Person educated: Patient Education method: Explanation, Demonstration, Tactile cues, Verbal cues, and Handouts Education comprehension: verbalized understanding, returned demonstration, verbal cues required, tactile cues required, and needs further education  HOME EXERCISE PROGRAM:  Access Code: [email or web address] URL: [email or web address] Date: 05/22/2023 Prepared by: Karie Mainland  Exercises - Supine Bridge  - 2 x daily - 7 x weekly - 1 sets - 10-15 reps - 5 hold - Supine Posterior Pelvic Tilt  - 2 x daily -  7 x weekly - 1 sets - 10-15 reps - 3 hold - Hooklying Clamshell with Resistance  - 2 x daily - 7 x weekly - 1 sets - 10-15 reps - 3 hold - Sit to Stand Without Arm Support  - 1 x daily - 7 x weekly - 2 sets - 10 reps - Standing Shoulder Row with Anchored Resistance  - 1 x daily - 7 x weekly - 2 sets - 10 reps - 5 hold - Shoulder extension with resistance - Neutral  - 1 x daily - 7 x weekly - 2 sets - 10 reps - 5 hold - Beginner Clam  - 1 x daily - 7 x weekly - 2 sets - 10 reps - 5 hold -  Sidelying Hip Abduction  - 1 x daily - 7 x weekly - 2 sets - 10 reps - 5 hold    ASSESSMENT:  CLINICAL IMPRESSION: Stability was the focus today during the session  Used dumbbell and soft Pilates ball under sacrum to destabilize core.  She had no increase in pain with supine mat. Walking with unilateral wgt did increase her L sided back and pelvic pain. Able to reduce this with prolonged sidelying position.    OBJECTIVE IMPAIRMENTS: difficulty walking, decreased ROM, decreased strength, obesity, and pain.   ACTIVITY LIMITATIONS: carrying, lifting, bending, sitting, standing, squatting, sleeping, bathing, reach over head, hygiene/grooming, and locomotion level  PARTICIPATION LIMITATIONS: meal prep, cleaning, laundry, shopping, community activity, occupation, and yard work  PERSONAL FACTORS: Fitness, Past/current experiences, Time since onset of injury/illness/exacerbation, and 1 comorbidity: high BMI  are also affecting patient's functional outcome.   REHAB POTENTIAL: Good  CLINICAL DECISION MAKING: Evolving/moderate complexity  EVALUATION COMPLEXITY: Moderate   GOALS:  SHORT TERM GOALS: Target date: 05/17/23  Pt will be Ind in an initial HEP  Baseline: 10/3: min cues  Goal status: ongoing   2.  Pt will voice understanding of measures to assist in pain reduction Baseline: started Goal status: MET   LONG TERM GOALS: Target date: 06/14/23  Pt will be Ind in a final HEP to maintain achieved  LOF  Baseline:  Goal status: INITIAL  2.  Improve trunk extension ROM to a min limitation for improved function Baseline: markedly imited Goal status: INITIAL  3.  Improve back/core strength to where the pt can sustain a bridge for 60 sec and forward plank from toes for 30 sec Baseline:  Goal status: INITIAL  4.  Pt will report a 50% improvement in low back pain with daily and work related activities for improved function and QOL Baseline: 5-8/10 Goal status: INITIAL  5.  Pt's FOTO score will improved to the predicted value of 52% as indication of improved function  Baseline: 45%, 05/24/23: 44% Goal status: ongoing  PLAN:  PT FREQUENCY: 2x/week  PT DURATION: 6 weeks  PLANNED INTERVENTIONS: Therapeutic exercises, Therapeutic activity, Neuromuscular re-education, Balance training, Gait training, Patient/Family education, Self Care, Joint mobilization, Aquatic Therapy, Dry Needling, Electrical stimulation, Spinal mobilization, Cryotherapy, Moist heat, Taping, Traction, Ultrasound, Ionotophoresis 4mg /ml Dexamethasone, Manual therapy, and Re-evaluation.  PLAN FOR NEXT SESSION: standing balance and core/hip strength  Karie Mainland, PT 05/24/23 8:04 AM Phone: [phone] Fax: [phone]

## 2023-05-24 ENCOUNTER — Ambulatory Visit: Payer: Medicaid Other | Admitting: Physical Therapy

## 2023-05-24 DIAGNOSIS — M5432 Sciatica, left side: Secondary | ICD-10-CM

## 2023-05-24 DIAGNOSIS — M5459 Other low back pain: Secondary | ICD-10-CM | POA: Diagnosis not present

## 2023-05-24 DIAGNOSIS — M5431 Sciatica, right side: Secondary | ICD-10-CM

## 2023-05-28 ENCOUNTER — Other Ambulatory Visit: Payer: Self-pay

## 2023-05-28 ENCOUNTER — Other Ambulatory Visit (HOSPITAL_COMMUNITY): Payer: Self-pay

## 2023-05-28 NOTE — Therapy (Signed)
OUTPATIENT PHYSICAL THERAPY THORACOLUMBAR NOTE   Patient Name: Regina Cantu MRN: [id number] DOB:July 15, 1981, 42 y.o., female Today's Date: 05/29/2023  END OF SESSION:  PT End of Session - 05/29/23 1420     Visit Number 8    Number of Visits 13    Date for PT Re-Evaluation 06/14/23    Authorization Type [place] MEDICAID [employer] CARITAS OF [place]    PT Start Time 1420    PT Stop Time 1500    PT Time Calculation (min) 40 min    Activity Tolerance Patient tolerated treatment well    Behavior During Therapy WFL for tasks assessed/performed                   Past Medical History:  Diagnosis Date   Anxiety    Cancer (HCC)    ovarian 2001 and 2010   Depression    Essential hypertension 05/04/2022   Herniated disc, cervical 2011   Hypertension    Migraine    Pityriasis rosea    Uncontrolled type 2 diabetes mellitus with hyperglycemia (HCC) 05/04/2022   URI, acute 08/17/2022   Past Surgical History:  Procedure Laterality Date   ABDOMINAL HYSTERECTOMY     OVARIAN CANCER     Patient Active Problem List   Diagnosis Date Noted   Back pain 03/18/2023   Abnormal Pap smear of cervix 12/27/2022   Tobacco use 08/17/2022   Depression 08/17/2022   Healthcare maintenance 07/04/2022   Migraine 07/04/2022   History of CVA (cerebrovascular accident) 05/04/2022   History of ovarian cancer 05/04/2022   Uncontrolled type 2 diabetes mellitus with hyperglycemia (HCC) 05/04/2022   Essential hypertension 05/04/2022   Malignant (primary) neoplasm, unspecified (HCC) 05/24/2021   Other specified disorders of eustachian tube, bilateral 05/24/2021   Allergy, unspecified, initial encounter 05/24/2021    PCP: Chauncey Mann, DO  REFERRING PROVIDER: London Sheer, MD  REFERRING DIAG: M54.50 (ICD-10-CM) - Low back pain, unspecified back pain laterality, unspecified chronicity, unspecified whether sciatica presen   Rationale for Evaluation and Treatment: Rehabilitation  THERAPY  DIAG:  Other low back pain  Sciatica, left side  Sciatica, right side  ONSET DATE: 30 years ago  SUBJECTIVE:                                                                                                                                                                                           SUBJECTIVE STATEMENT: Pt reports therapy is helping and estimates approx 30% improvement since the start ofcare.  PERTINENT HISTORY:  High BMI, DM- uncontrolled, Stroke  PAIN:  Are you having pain? Yes: NPRS scale: 3/10. Pain  range on eval: 5-8/10 Pain location: Low back to hips and bilateral knees Pain description: Throbbing, achy Aggravating factors: Prolonged standing, walking, sitting Relieving factors: Stretching, ocasional medications  PRECAUTIONS: None  RED FLAGS: None   WEIGHT BEARING RESTRICTIONS: No  FALLS:  Has patient fallen in last 6 months?  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 To mange pain better.  NEXT MD VISIT: 10/7 Dr. Christell Constant  OBJECTIVE:   DIAGNOSTIC FINDINGS:  XRs of the lumbar spine from 04/01/2023 were independently reviewed and  interpreted, showing disc height loss at L4/5. No other significant  degenerative changes. No fracture or dislocation seen.  No evidence of  instability on flexion/extension views.  Lordotic alignment.   PATIENT SURVEYS:  FOTO: Perceived function 45%, predicted 52%   SCREENING FOR RED FLAGS: Bowel or bladder incontinence: No Spinal tumors: No Cauda equina syndrome: No Compression fracture: No  COGNITION: Overall cognitive status: Within functional limits for tasks assessed     SENSATION: WFL  MUSCLE LENGTH: Hamstrings: Right WNLs deg; Left WNLs  deg Maisie Fus test: Right Tight deg; Left Tight deg  POSTURE: rounded shoulders, forward head, and increased lumbar lordosis  PALPATION: Repot of pressure to midline low back, paraspinal, PSIs, and gluteals  LUMBAR ROM:   AROM eval  Flexion Full, relief, use hands to walk up to return to standing  Extension Markedly limited, pain  Right lateral flexion Min limited, no pain  Left lateral flexion Min limited, pain R  Right rotation Full, no pain  Left rotation Full, pain R   (Blank rows = not tested)  LOWER EXTREMITY ROM:     WNLs Active  Right eval Left eval  Hip flexion    Hip extension    Hip abduction    Hip adduction    Hip internal rotation    Hip external rotation    Knee flexion    Knee extension    Ankle dorsiflexion    Ankle plantarflexion    Ankle inversion    Ankle eversion     (Blank rows = not tested)  LOWER EXTREMITY MMT:    Grossly 4+ to 5/5 and equal. Myotome screen neg  Weak core MMT Right eval Left eval  Hip flexion    Hip extension    Hip abduction    Hip adduction    Hip internal rotation    Hip external rotation    Knee flexion    Knee extension    Ankle dorsiflexion    Ankle plantarflexion    Ankle inversion    Ankle eversion     (Blank rows = not tested)  LUMBAR SPECIAL TESTS:  Straight leg raise test: Negative, Slump test: Negative, SI Compression/distraction test: Negative, and FABER test: Negative  FUNCTIONAL TESTS:  NT  GAIT: Distance walked: 200' Assistive device utilized: None Level of assistance: Complete Independence Comments: WNLs  TODAY'S TREATMENT:      [facility] Adult PT Treatment:                                                DATE: 05/29/23 Therapeutic Exercise: NuStep L6 UE  and LE for 6 min  SLS on airex for balance and core stability Supine neutral core  Supine A/P tilt with ball  Supine march , knee ext,  Bridge with ball x 10 Gait with 10 lbs KB suitcase carry Palloff press 2x10 double RTB Manual  Therapy: CPAs and UPAs to the lumbar vertebrae  [facility] Adult PT Treatment:                                                DATE: 05/24/23 Therapeutic Exercise: NuStep L6 UE and LE for 6 min  Supine neutral core  Supine A/P tilt with ball  Supine march , knee ext,  Lat pull over, horizontal abd unilateral x 6 lbs Bridge with ball x 10 Gait with 15 lbs KB suitcase carry: increased pain when wgt on L side in L UE, leans more with this as well Sidelying stretch to L trunk, leg off table  Childs pose, crosses knees for comfort Manual Therapy: Manual overpressure to sidelying stretch (iliac crest/pelvis) brief     [facility] Adult PT Treatment:                                                DATE: 05/22/23 Therapeutic Exercise: NuStep L5 UE and LE for 6 min  Supine full body extension  Supine Posterior Pelvic Tilt  x 10  Supine Bridge x 10 Hooklying bent knee fall out x 10 each  Supine 90/90 with knee ext x 10 each  Sidelying hip abd x 10  Sidelying clam x  15  Calf stretch on wall x 2  Standing Shoulder Row with Anchored Resistance  springboard 2 x 15  Shoulder extension with resistance x  15 , added leg lift to SLS     [facility] Adult PT Treatment:                                                DATE: 05/09/23 Therapeutic Exercise: NuStep 5 min L4 UE and LE Physio ball supine on mat: hamstring curl Lower trunk rotation x 10 Bridge with ball x 10  SLR from ball x 10  Green band x 15 double leg fall out Green banded bridge x 15 Combo bridge with clam (small ROM )  Sit to stand with band on thighs x 10  Full trunk flexion seated  Row, extension and horizontal abd green band x 15   PATIENT EDUCATION:  Education details: Eval findings, POC, HEP, self care  Person educated: Patient Education method: Explanation, Demonstration, Tactile cues, Verbal cues, and Handouts Education comprehension: verbalized understanding, returned demonstration, verbal cues required, tactile cues required, and  needs further education  HOME EXERCISE PROGRAM:  Access Code: [email or web address] URL: [email or web address] Date: 05/22/2023 Prepared by: Karie Mainland  Exercises - Supine Bridge  - 2 x daily - 7 x weekly - 1 sets - 10-15 reps - 5 hold - Supine Posterior Pelvic Tilt  - 2 x daily - 7 x weekly - 1 sets - 10-15 reps - 3 hold - Hooklying Clamshell with Resistance  - 2 x daily - 7  x weekly - 1 sets - 10-15 reps - 3 hold - Sit to Stand Without Arm Support  - 1 x daily - 7 x weekly - 2 sets - 10 reps - Standing Shoulder Row with Anchored Resistance  - 1 x daily - 7 x weekly - 2 sets - 10 reps - 5 hold - Shoulder extension with resistance - Neutral  - 1 x daily - 7 x weekly - 2 sets - 10 reps - 5 hold - Beginner Clam  - 1 x daily - 7 x weekly - 2 sets - 10 reps - 5 hold - Sidelying Hip Abduction  - 1 x daily - 7 x weekly - 2 sets - 10 reps - 5 hold    ASSESSMENT:  CLINICAL IMPRESSION: PT was completed for lumbopelvic flexibility and strengthening. Manual lumbar CPAs and UPAs were followed strengthening core stability therex. Pt reported decreased low back pain at the end of the session, an overall reports 30% improvement if her low back and hip pain. Pt is responding positively to PT. Pt will continue to benefit from skilled PT to address impairments for improved function   OBJECTIVE IMPAIRMENTS: difficulty walking, decreased ROM, decreased strength, obesity, and pain.   ACTIVITY LIMITATIONS: carrying, lifting, bending, sitting, standing, squatting, sleeping, bathing, reach over head, hygiene/grooming, and locomotion level  PARTICIPATION LIMITATIONS: meal prep, cleaning, laundry, shopping, community activity, occupation, and yard work  PERSONAL FACTORS: Fitness, Past/current experiences, Time since onset of injury/illness/exacerbation, and 1 comorbidity: high BMI  are also affecting patient's functional outcome.   REHAB POTENTIAL: Good  CLINICAL DECISION MAKING: Evolving/moderate  complexity  EVALUATION COMPLEXITY: Moderate   GOALS:  SHORT TERM GOALS: Target date: 05/17/23  Pt will be Ind in an initial HEP  Baseline: 10/3: min cues  Goal status: ongoing   2.  Pt will voice understanding of measures to assist in pain reduction Baseline: started Goal status: MET   LONG TERM GOALS: Target date: 06/14/23  Pt will be Ind in a final HEP to maintain achieved LOF  Baseline:  Goal status: INITIAL  2.  Improve trunk extension ROM to a min limitation for improved function Baseline: markedly imited Goal status: INITIAL  3.  Improve back/core strength to where the pt can sustain a bridge for 60 sec and forward plank from toes for 30 sec Baseline:  Goal status: INITIAL  4.  Pt will report a 50% improvement in low back pain with daily and work related activities for improved function and QOL Baseline: 5-8/10 05/29/23: 30% improvement Goal status: IMPROVING  5.  Pt's FOTO score will improved to the predicted value of 52% as indication of improved function  Baseline: 45%, 05/24/23: 44% Goal status: ongoing  PLAN:  PT FREQUENCY: 2x/week  PT DURATION: 6 weeks  PLANNED INTERVENTIONS: Therapeutic exercises, Therapeutic activity, Neuromuscular re-education, Balance training, Gait training, Patient/Family education, Self Care, Joint mobilization, Aquatic Therapy, Dry Needling, Electrical stimulation, Spinal mobilization, Cryotherapy, Moist heat, Taping, Traction, Ultrasound, Ionotophoresis 4mg /ml Dexamethasone, Manual therapy, and Re-evaluation.  PLAN FOR NEXT SESSION: standing balance and core/hip strength  Joellyn Rued MS, PT 05/29/23 5:23 PM

## 2023-05-29 ENCOUNTER — Ambulatory Visit: Payer: Medicaid Other

## 2023-05-29 DIAGNOSIS — M5432 Sciatica, left side: Secondary | ICD-10-CM | POA: Diagnosis not present

## 2023-05-29 DIAGNOSIS — M5431 Sciatica, right side: Secondary | ICD-10-CM

## 2023-05-29 DIAGNOSIS — M5459 Other low back pain: Secondary | ICD-10-CM | POA: Diagnosis not present

## 2023-06-04 ENCOUNTER — Other Ambulatory Visit: Payer: Self-pay

## 2023-06-04 ENCOUNTER — Other Ambulatory Visit (HOSPITAL_COMMUNITY): Payer: Self-pay

## 2023-06-05 ENCOUNTER — Ambulatory Visit: Payer: Medicaid Other

## 2023-06-05 DIAGNOSIS — M5431 Sciatica, right side: Secondary | ICD-10-CM | POA: Diagnosis not present

## 2023-06-05 DIAGNOSIS — M5432 Sciatica, left side: Secondary | ICD-10-CM | POA: Diagnosis not present

## 2023-06-05 DIAGNOSIS — M5459 Other low back pain: Secondary | ICD-10-CM | POA: Diagnosis not present

## 2023-06-05 NOTE — Therapy (Signed)
OUTPATIENT PHYSICAL THERAPY THORACOLUMBAR NOTE   Patient Name: Tanasha Nuber MRN: [id number] DOB:1981/01/18, 42 y.o., female Today's Date: 06/05/2023  END OF SESSION:  PT End of Session - 06/05/23 0810     Visit Number 9    Number of Visits 13    Date for PT Re-Evaluation 06/14/23    Authorization Type [place] MEDICAID [employer] OF [place]    PT Start Time 0805    PT Stop Time 0845    PT Time Calculation (min) 40 min    Activity Tolerance Patient tolerated treatment well    Behavior During Therapy [facility] for tasks assessed/performed                    Past Medical History:  Diagnosis Date   Anxiety    Cancer (HCC)    ovarian 2001 and 2010   Depression    Essential hypertension 05/04/2022   Herniated disc, cervical 2011   Hypertension    Migraine    Pityriasis rosea    Uncontrolled type 2 diabetes mellitus with hyperglycemia (HCC) 05/04/2022   URI, acute 08/17/2022   Past Surgical History:  Procedure Laterality Date   ABDOMINAL HYSTERECTOMY     OVARIAN CANCER     Patient Active Problem List   Diagnosis Date Noted   Back pain 03/18/2023   Abnormal Pap smear of cervix 12/27/2022   Tobacco use 08/17/2022   Depression 08/17/2022   Healthcare maintenance 07/04/2022   Migraine 07/04/2022   History of CVA (cerebrovascular accident) 05/04/2022   History of ovarian cancer 05/04/2022   Uncontrolled type 2 diabetes mellitus with hyperglycemia (HCC) 05/04/2022   Essential hypertension 05/04/2022   Malignant (primary) neoplasm, unspecified (HCC) 05/24/2021   Other specified disorders of eustachian tube, bilateral 05/24/2021   Allergy, unspecified, initial encounter 05/24/2021    PCP: Chauncey Mann, DO  REFERRING PROVIDER: London Sheer, MD  REFERRING DIAG: M54.50 (ICD-10-CM) - Low back pain, unspecified back pain laterality, unspecified chronicity, unspecified whether sciatica presen   Rationale for Evaluation and Treatment:  Rehabilitation  THERAPY DIAG:  Other low back pain  Sciatica, left side  Sciatica, right side  ONSET DATE: 30 years ago  SUBJECTIVE:                                                                                                                                                                                           SUBJECTIVE STATEMENT: Pt reports a co-worker cracked her back which she found helped her back.  PERTINENT HISTORY:  High BMI, DM- uncontrolled, Stroke  PAIN:  Are you having pain? Yes: NPRS scale: 4/10. Pain  range on eval: 5-8/10 Pain location: Low back to hips and bilateral knees Pain description: Throbbing, achy Aggravating factors: Prolonged standing, walking, sitting Relieving factors: Stretching, ocasional medications  PRECAUTIONS: None  RED FLAGS: None   WEIGHT BEARING RESTRICTIONS: No  FALLS:  Has patient fallen in last 6 months?  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 To mange pain better.  NEXT MD VISIT: 10/7 Dr. Christell Constant  OBJECTIVE:   DIAGNOSTIC FINDINGS:  XRs of the lumbar spine from 04/01/2023 were independently reviewed and  interpreted, showing disc height loss at L4/5. No other significant  degenerative changes. No fracture or dislocation seen.  No evidence of  instability on flexion/extension views.  Lordotic alignment.   PATIENT SURVEYS:  FOTO: Perceived function 45%, predicted 52%   SCREENING FOR RED FLAGS: Bowel or bladder incontinence: No Spinal tumors: No Cauda equina syndrome: No Compression fracture: No  COGNITION: Overall cognitive status: Within functional limits for tasks assessed     SENSATION: WFL  MUSCLE LENGTH: Hamstrings: Right WNLs deg;  Left WNLs deg Maisie Fus test: Right Tight deg; Left Tight deg  POSTURE: rounded shoulders, forward head, and increased lumbar lordosis  PALPATION: Repot of pressure to midline low back, paraspinal, PSIs, and gluteals  LUMBAR ROM:   AROM eval  Flexion Full, relief, use hands to walk up to return to standing  Extension Markedly limited, pain  Right lateral flexion Min limited, no pain  Left lateral flexion Min limited, pain R  Right rotation Full, no pain  Left rotation Full, pain R   (Blank rows = not tested)  LOWER EXTREMITY ROM:     WNLs Active  Right eval Left eval  Hip flexion    Hip extension    Hip abduction    Hip adduction    Hip internal rotation    Hip external rotation    Knee flexion    Knee extension    Ankle dorsiflexion    Ankle plantarflexion    Ankle inversion    Ankle eversion     (Blank rows = not tested)  LOWER EXTREMITY MMT:    Grossly 4+ to 5/5 and equal. Myotome screen neg  Weak core MMT Right eval Left eval  Hip flexion    Hip extension    Hip abduction    Hip adduction    Hip internal rotation    Hip external rotation    Knee flexion    Knee extension    Ankle dorsiflexion    Ankle plantarflexion    Ankle inversion    Ankle eversion     (Blank rows = not tested)  LUMBAR SPECIAL TESTS:  Straight leg raise test: Negative, Slump test: Negative, SI Compression/distraction test: Negative, and FABER test: Negative  FUNCTIONAL TESTS:  NT  GAIT: Distance walked: 200' Assistive device utilized: None Level of assistance: Complete Independence Comments: WNLs  TODAY'S TREATMENT:     [facility] Adult PT Treatment:                                                DATE: 06/05/23 Therapeutic Exercise: NuStep L5 UE and  LE for 6 min  Supine Posterior Pelvic Tilt  x 10  SLR c PPT using pilates circle  2x8 Supine Bridge 2x8, sustained 28" Plank from knees x5 5"; pt reports low back pressure and therex was stopped H/L clam 2x10  STS 2x10   [facility]  Adult PT Treatment:                                                DATE: 05/29/23 Therapeutic Exercise: NuStep L6 UE and LE for 6 min  SLS on airex for balance and core stability Supine neutral core  Supine A/P tilt with ball  Supine march , knee ext,  Bridge with ball x 10 Gait with 10 lbs KB suitcase carry Palloff press 2x10 double RTB Manual Therapy: CPAs and UPAs to the lumbar vertebrae  [facility] Adult PT Treatment:                                                DATE: 05/24/23 Therapeutic Exercise: NuStep L6 UE and LE for 6 min  Supine neutral core  Supine A/P tilt with ball  Supine march , knee ext,  Lat pull over, horizontal abd unilateral x 6 lbs Bridge with ball x 10 Gait with 15 lbs KB suitcase carry: increased pain when wgt on L side in L UE, leans more with this as well Sidelying stretch to L trunk, leg off table  Childs pose, crosses knees for comfort Manual Therapy: Manual overpressure to sidelying stretch (iliac crest/pelvis) brief     [facility] Adult PT Treatment:                                                DATE: 05/22/23 Therapeutic Exercise: NuStep L5 UE and LE for 6 min  Supine full body extension  Supine Posterior Pelvic Tilt  x 10  Supine Bridge x 10 Hooklying bent knee fall out x 10 each  Supine 90/90 with knee ext x 10 each  Sidelying hip abd x 10  Sidelying clam x  15  Calf stretch on wall x 2  Standing Shoulder Row with Anchored Resistance  springboard 2 x 15  Shoulder extension with resistance x  15 , added leg lift to SLS  PATIENT EDUCATION:  Education details: Eval findings, POC, HEP, self care  Person educated: Patient Education method: Explanation, Demonstration, Tactile cues, Verbal cues, and Handouts Education comprehension: verbalized understanding, returned demonstration, verbal cues required, tactile cues required, and needs further education  HOME EXERCISE PROGRAM:  Access Code: [email or web address] URL: [email or web address] Date:  05/22/2023 Prepared by: Karie Mainland  Exercises - Supine Bridge  - 2 x daily - 7 x weekly - 1 sets - 10-15 reps - 5 hold - Supine Posterior Pelvic Tilt  - 2 x daily - 7 x weekly - 1 sets - 10-15 reps - 3 hold - Hooklying Clamshell with Resistance  - 2 x daily - 7 x weekly - 1 sets - 10-15 reps - 3 hold - Sit to Stand Without Arm Support  - 1 x daily - 7 x weekly - 2 sets -  10 reps - Standing Shoulder Row with Anchored Resistance  - 1 x daily - 7 x weekly - 2 sets - 10 reps - 5 hold - Shoulder extension with resistance - Neutral  - 1 x daily - 7 x weekly - 2 sets - 10 reps - 5 hold - Beginner Clam  - 1 x daily - 7 x weekly - 2 sets - 10 reps - 5 hold - Sidelying Hip Abduction  - 1 x daily - 7 x weekly - 2 sets - 10 reps - 5 hold   ASSESSMENT:  CLINICAL IMPRESSION: Continued PT for lumbopelvic flexibility and strengthening c focus on core. Assessed sustained bridging and plank from knees. Bridging was found less than set goal, and pt reported low back pressure/pain with the plank from knees. Planks were Dced with pt's report. Assessed lumbar ext which improved from markedly to mod limited. Pt's subjective report indicates good improvement in pain, for objective measures have made min progress. Pt tolerated PT today without adverse effects. Pt will continue to benefit from skilled PT to address impairments for improved function. Will reassess FOTO the next PT visit.     OBJECTIVE IMPAIRMENTS: difficulty walking, decreased ROM, decreased strength, obesity, and pain.   ACTIVITY LIMITATIONS: carrying, lifting, bending, sitting, standing, squatting, sleeping, bathing, reach over head, hygiene/grooming, and locomotion level  PARTICIPATION LIMITATIONS: meal prep, cleaning, laundry, shopping, community activity, occupation, and yard work  PERSONAL FACTORS: Fitness, Past/current experiences, Time since onset of injury/illness/exacerbation, and 1 comorbidity: high BMI  are also affecting patient's  functional outcome.   REHAB POTENTIAL: Good  CLINICAL DECISION MAKING: Evolving/moderate complexity  EVALUATION COMPLEXITY: Moderate   GOALS:  SHORT TERM GOALS: Target date: 05/17/23  Pt will be Ind in an initial HEP  Baseline: 10/3: min cues  Goal status: ongoing   2.  Pt will voice understanding of measures to assist in pain reduction Baseline: started Goal status: MET   LONG TERM GOALS: Target date: 06/14/23  Pt will be Ind in a final HEP to maintain achieved LOF  Baseline:  Goal status: INITIAL  2.  Improve trunk extension ROM to a min limitation for improved function Baseline: markedly imited 06/05/23: Mod limitation Goal status: IMPROVED  3.  Improve back/core strength to where the pt can sustain a bridge for 60 sec and forward plank from toes for 30 sec Baseline:  06/05/23: Bridge 32 sec; plank from knees 5 sec Goal status: ONGOING for bridge. Deferred for plank  4.  Pt will report a 50% improvement in low back pain with daily and work related activities for improved function and QOL Baseline: 5-8/10 05/29/23: 30% improvement Goal status: IMPROVING  5.  Pt's FOTO score will improved to the predicted value of 52% as indication of improved function  Baseline: 45%, 05/24/23: 44% Goal status: ongoing  PLAN:  PT FREQUENCY: 2x/week  PT DURATION: 6 weeks  PLANNED INTERVENTIONS: Therapeutic exercises, Therapeutic activity, Neuromuscular re-education, Balance training, Gait training, Patient/Family education, Self Care, Joint mobilization, Aquatic Therapy, Dry Needling, Electrical stimulation, Spinal mobilization, Cryotherapy, Moist heat, Taping, Traction, Ultrasound, Ionotophoresis 4mg /ml Dexamethasone, Manual therapy, and Re-evaluation.  PLAN FOR NEXT SESSION: standing balance and core/hip strength  Angelus Hoopes MS, PT 06/05/23 1:36 PM

## 2023-06-07 ENCOUNTER — Other Ambulatory Visit (HOSPITAL_COMMUNITY): Payer: Self-pay

## 2023-06-07 ENCOUNTER — Ambulatory Visit: Payer: Medicaid Other | Attending: Orthopedic Surgery

## 2023-06-07 DIAGNOSIS — M5459 Other low back pain: Secondary | ICD-10-CM | POA: Diagnosis not present

## 2023-06-07 DIAGNOSIS — M5431 Sciatica, right side: Secondary | ICD-10-CM | POA: Insufficient documentation

## 2023-06-07 DIAGNOSIS — M5432 Sciatica, left side: Secondary | ICD-10-CM | POA: Diagnosis not present

## 2023-06-10 ENCOUNTER — Other Ambulatory Visit (HOSPITAL_COMMUNITY): Payer: Self-pay

## 2023-06-11 ENCOUNTER — Ambulatory Visit: Payer: Medicaid Other

## 2023-06-11 DIAGNOSIS — M5459 Other low back pain: Secondary | ICD-10-CM

## 2023-06-11 DIAGNOSIS — M5431 Sciatica, right side: Secondary | ICD-10-CM | POA: Diagnosis not present

## 2023-06-11 DIAGNOSIS — M5432 Sciatica, left side: Secondary | ICD-10-CM

## 2023-06-11 NOTE — Therapy (Signed)
OUTPATIENT PHYSICAL THERAPY THORACOLUMBAR NOTE   Patient Name: Regina Cantu MRN: [id number] DOB:Apr 06, 1981, 42 y.o., female Today's Date: 06/11/2023  END OF SESSION:  PT End of Session - 06/11/23 0827     Visit Number 11    Number of Visits 13    Date for PT Re-Evaluation 06/14/23    Authorization Type [place] MEDICAID [employer] CARIT[employer] OF [place]    PT Start Time 0818    PT Stop Time 0845    PT Time Calculation (min) 27 min    Activity Tolerance Patient tolerated treatment well    Behavior During Therapy WFL for tasks assessed/performed                      Past Medical History:  Diagnosis Date   Anxiety    Cancer (HCC)    ovarian 2001 and 2010   Depression    Essential hypertension 05/04/2022   Herniated disc, cervical 2011   Hypertension    Migraine    Pityriasis rosea    Uncontrolled type 2 diabetes mellitus with hyperglycemia (HCC) 05/04/2022   URI, acute 08/17/2022   Past Surgical History:  Procedure Laterality Date   ABDOMINAL HYSTERECTOMY     OVARIAN CANCER     Patient Active Problem List   Diagnosis Date Noted   Back pain 03/18/2023   Abnormal Pap smear of cervix 12/27/2022   Tobacco use 08/17/2022   Depression 08/17/2022   Healthcare maintenance 07/04/2022   Migraine 07/04/2022   History of CVA (cerebrovascular accident) 05/04/2022   History of ovarian cancer 05/04/2022   Uncontrolled type 2 diabetes mellitus with hyperglycemia (HCC) 05/04/2022   Essential hypertension 05/04/2022   Malignant (primary) neoplasm, unspecified (HCC) 05/24/2021   Other specified disorders of eustachian tube, bilateral 05/24/2021   Allergy, unspecified, initial encounter 05/24/2021    PCP: Chauncey Mann, DO  REFERRING PROVIDER: London Sheer, MD  REFERRING DIAG: M54.50 (ICD-10-CM) - Low back pain, unspecified back pain laterality, unspecified chronicity, unspecified whether sciatica presen   Rationale for Evaluation and Treatment:  Rehabilitation  THERAPY DIAG:  Other low back pain  Sciatica, left side  Sciatica, right side  ONSET DATE: 30 years ago  SUBJECTIVE:                                                                                                                                                                                           SUBJECTIVE STATEMENT: Pt reports feeling tired with the time change.  PERTINENT HISTORY:  High BMI, DM- uncontrolled, Stroke  PAIN:  Are you having pain? Yes: NPRS scale: 4/10. Pain range on eval:  5-8/10 Pain location: Low back to hips and bilateral knees Pain description: Throbbing, achy Aggravating factors: Prolonged standing, walking, sitting Relieving factors: Stretching, ocasional medications  PRECAUTIONS: None  RED FLAGS: None   WEIGHT BEARING RESTRICTIONS: No  FALLS:  Has patient fallen in last 6 months?  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 To mange pain better.  NEXT MD VISIT: 10/7 Dr. Christell Constant  OBJECTIVE:   DIAGNOSTIC FINDINGS:  XRs of the lumbar spine from 04/01/2023 were independently reviewed and  interpreted, showing disc height loss at L4/5. No other significant  degenerative changes. No fracture or dislocation seen.  No evidence of  instability on flexion/extension views.  Lordotic alignment.   PATIENT SURVEYS:  FOTO: Perceived function 45%, predicted 52%   SCREENING FOR RED FLAGS: Bowel or bladder incontinence: No Spinal tumors: No Cauda equina syndrome: No Compression fracture: No  COGNITION: Overall cognitive status: Within functional limits for tasks assessed     SENSATION: WFL  MUSCLE LENGTH: Hamstrings: Right WNLs deg; Left WNLs deg Maisie Fus test:  Right Tight deg; Left Tight deg  POSTURE: rounded shoulders, forward head, and increased lumbar lordosis  PALPATION: Repot of pressure to midline low back, paraspinal, PSIs, and gluteals  LUMBAR ROM:   AROM eval  Flexion Full, relief, use hands to walk up to return to standing  Extension Markedly limited, pain  Right lateral flexion Min limited, no pain  Left lateral flexion Min limited, pain R  Right rotation Full, no pain  Left rotation Full, pain R   (Blank rows = not tested)  LOWER EXTREMITY ROM:     WNLs Active  Right eval Left eval  Hip flexion    Hip extension    Hip abduction    Hip adduction    Hip internal rotation    Hip external rotation    Knee flexion    Knee extension    Ankle dorsiflexion    Ankle plantarflexion    Ankle inversion    Ankle eversion     (Blank rows = not tested)  LOWER EXTREMITY MMT:    Grossly 4+ to 5/5 and equal. Myotome screen neg  Weak core MMT Right eval Left eval  Hip flexion    Hip extension    Hip abduction    Hip adduction    Hip internal rotation    Hip external rotation    Knee flexion    Knee extension    Ankle dorsiflexion    Ankle plantarflexion    Ankle inversion    Ankle eversion     (Blank rows = not tested)  LUMBAR SPECIAL TESTS:  Straight leg raise test: Negative, Slump test: Negative, SI Compression/distraction test: Negative, and FABER test: Negative  FUNCTIONAL TESTS:  NT  GAIT: Distance walked: 200' Assistive device utilized: None Level of assistance: Complete Independence Comments: WNLs  TODAY'S TREATMENT: [facility] Adult PT Treatment:                                                DATE: 06/11/23 Therapeutic Exercise: Supine Posterior Pelvic Tilt  x 10  Marching with PPT  2x5 SLR c PPT using pilates circle  2x8 Supine Bridge c hip abd set c BluTB Prone on pillows hip ext x10 each  [facility] Adult PT Treatment:                                                DATE: 06/07/23 Therapeutic Exercise: L  stretch forward and laterally S/L open books x8 Shoulder rows x15 GTB Shoulder ext x15 GTB Slow marching on airex for core stability Manual Therapy: STM c MTPR to the R upper lumbar paraspinals Self Care: Instruction in and pt retuned demonstration for tennis ball massage on wall to low back      [facility] Adult PT Treatment:                                                DATE: 06/05/23 Therapeutic Exercise: NuStep L5 UE and LE for 6 min  Supine Posterior Pelvic Tilt  x 10  SLR c PPT using pilates circle  2x8 Supine Bridge 2x8, sustained 28" Plank from knees x5 5"; pt reports low back pressure and therex was stopped H/L clam 2x10  STS 2x10   [facility] Adult PT Treatment:                                                DATE: 05/29/23 Therapeutic Exercise: NuStep L6 UE and LE for 6 min  SLS on airex for balance and core stability Supine neutral core  Supine A/P tilt with ball  Supine march , knee ext,  Bridge with ball x 10 Gait with 10 lbs KB suitcase carry Palloff press 2x10 double RTB Manual Therapy: CPAs and UPAs to the lumbar vertebrae  PATIENT EDUCATION:  Education details: Eval findings, POC, HEP, self care  Person educated: Patient Education method: Explanation, Demonstration, Tactile cues, Verbal cues, and Handouts Education comprehension: verbalized understanding, returned demonstration, verbal cues required, tactile cues required, and needs further education  HOME EXERCISE PROGRAM:  Access Code: [email or web address] URL: [email or web address] Date: 05/22/2023 Prepared by: Karie Mainland  Exercises - Supine Bridge  - 2 x daily - 7 x weekly - 1 sets - 10-15 reps - 5 hold - Supine Posterior Pelvic Tilt  - 2 x daily - 7 x weekly - 1 sets - 10-15 reps - 3 hold - Hooklying Clamshell with Resistance  - 2 x daily - 7 x weekly - 1 sets - 10-15 reps - 3 hold - Sit to Stand Without Arm Support  - 1 x daily - 7 x weekly - 2 sets - 10 reps - Standing Shoulder Row with Anchored  Resistance  - 1 x daily - 7 x weekly - 2 sets - 10 reps - 5 hold - Shoulder extension with resistance - Neutral  - 1 x daily - 7 x weekly - 2 sets - 10 reps - 5 hold - Beginner Clam  - 1 x daily - 7 x weekly - 2 sets - 10 reps - 5 hold - Sidelying Hip Abduction  - 1 x daily - 7 x weekly - 2 sets - 10 reps -  5 hold   ASSESSMENT:  CLINICAL IMPRESSION: PT session time was limited due to pt arriving late. Pt was completed for trunk strengthening and stability. Will reassess FOTO and objective measures the next PT visit for progress (not completed today with pt arriving late). The next PT session is the pt's last scheduled appt. Will determine course for future PT re: continuation or DC. Pt tolerated PT today without adverse effects.    OBJECTIVE IMPAIRMENTS: difficulty walking, decreased ROM, decreased strength, obesity, and pain.   ACTIVITY LIMITATIONS: carrying, lifting, bending, sitting, standing, squatting, sleeping, bathing, reach over head, hygiene/grooming, and locomotion level  PARTICIPATION LIMITATIONS: meal prep, cleaning, laundry, shopping, community activity, occupation, and yard work  PERSONAL FACTORS: Fitness, Past/current experiences, Time since onset of injury/illness/exacerbation, and 1 comorbidity: high BMI  are also affecting patient's functional outcome.   REHAB POTENTIAL: Good  CLINICAL DECISION MAKING: Evolving/moderate complexity  EVALUATION COMPLEXITY: Moderate   GOALS:  SHORT TERM GOALS: Target date: 05/17/23  Pt will be Ind in an initial HEP  Baseline: 10/3: min cues  Goal status: ongoing   2.  Pt will voice understanding of measures to assist in pain reduction Baseline: started Goal status: MET   LONG TERM GOALS: Target date: 06/14/23  Pt will be Ind in a final HEP to maintain achieved LOF  Baseline:  Goal status: INITIAL  2.  Improve trunk extension ROM to a min limitation for improved function Baseline: markedly imited 06/05/23: Mod  limitation Goal status: IMPROVED  3.  Improve back/core strength to where the pt can sustain a bridge for 60 sec and forward plank from toes for 30 sec Baseline:  06/05/23: Bridge 32 sec; plank from knees 5 sec Goal status: ONGOING for bridge. Deferred for plank  4.  Pt will report a 50% improvement in low back pain with daily and work related activities for improved function and QOL Baseline: 5-8/10 05/29/23: 30% improvement Goal status: IMPROVING  5.  Pt's FOTO score will improved to the predicted value of 52% as indication of improved function  Baseline: 45%, 05/24/23: 44% Goal status: ongoing  PLAN:  PT FREQUENCY: 2x/week  PT DURATION: 6 weeks  PLANNED INTERVENTIONS: Therapeutic exercises, Therapeutic activity, Neuromuscular re-education, Balance training, Gait training, Patient/Family education, Self Care, Joint mobilization, Aquatic Therapy, Dry Needling, Electrical stimulation, Spinal mobilization, Cryotherapy, Moist heat, Taping, Traction, Ultrasound, Ionotophoresis 4mg /ml Dexamethasone, Manual therapy, and Re-evaluation.  PLAN FOR NEXT SESSION: standing balance and core/hip strength  [place]occo Gipe MS, PT 06/11/23 8:51 AM

## 2023-06-12 NOTE — Therapy (Signed)
OUTPATIENT PHYSICAL THERAPY THORACOLUMBAR NOTE/Re-Cert   Patient Name: Ariele Tomita MRN: [id number] DOB:22-May-1981, 42 y.o., female Today's Date: 06/14/2023  END OF SESSION:  PT End of Session - 06/14/23 0809     Visit Number 12    Number of Visits 18    Date for PT Re-Evaluation 08/02/23    Authorization Type [place] MEDICAID [employer] [place]    PT Start Time 0806    PT Stop Time 0845    PT Time Calculation (min) 39 min    Activity Tolerance Patient tolerated treatment well    Behavior During Therapy [facility] for tasks assessed/performed                       Past Medical History:  Diagnosis Date   Anxiety    Cancer (HCC)    ovarian 2001 and 2010   Depression    Essential hypertension 05/04/2022   Herniated disc, cervical 2011   Hypertension    Migraine    Pityriasis rosea    Uncontrolled type 2 diabetes mellitus with hyperglycemia (HCC) 05/04/2022   URI, acute 08/17/2022   Past Surgical History:  Procedure Laterality Date   ABDOMINAL HYSTERECTOMY     OVARIAN CANCER     Patient Active Problem List   Diagnosis Date Noted   Back pain 03/18/2023   Abnormal Pap smear of cervix 12/27/2022   Tobacco use 08/17/2022   Depression 08/17/2022   Healthcare maintenance 07/04/2022   Migraine 07/04/2022   History of CVA (cerebrovascular accident) 05/04/2022   History of ovarian cancer 05/04/2022   Uncontrolled type 2 diabetes mellitus with hyperglycemia (HCC) 05/04/2022   Essential hypertension 05/04/2022   Malignant (primary) neoplasm, unspecified (HCC) 05/24/2021   Other specified disorders of eustachian tube, bilateral 05/24/2021   Allergy, unspecified, initial encounter 05/24/2021    PCP: Chauncey Mann, DO  REFERRING PROVIDER: London Sheer, MD  REFERRING DIAG: M54.50 (ICD-10-CM) - Low back pain, unspecified back pain laterality, unspecified chronicity, unspecified whether sciatica presen   Rationale for Evaluation and Treatment:  Rehabilitation  THERAPY DIAG:  Other low back pain - Plan: PT plan of care cert/re-cert  Sciatica, left side - Plan: PT plan of care cert/re-cert  Sciatica, right side - Plan: PT plan of care cert/re-cert  ONSET DATE: 30 years ago  SUBJECTIVE:                                                                                                                                                                                           SUBJECTIVE STATEMENT: Pt reports her low back overall is feeling better, approx  40%  PERTINENT HISTORY:  High BMI, DM- uncontrolled, Stroke  PAIN:  Are you having pain? Yes: NPRS scale: 4/10. Pain range on eval: 5-8/10 Pain location: Low back to hips and bilateral knees Pain description: Throbbing, achy Aggravating factors: Prolonged standing, walking, sitting Relieving factors: Stretching, ocasional medications  PRECAUTIONS: None  RED FLAGS: None   WEIGHT BEARING RESTRICTIONS: No  FALLS:  Has patient fallen in last 6 months?  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 To mange pain better.  NEXT MD VISIT: 10/7 Dr. Christell Constant  OBJECTIVE:   DIAGNOSTIC FINDINGS:  XRs of the lumbar spine from 04/01/2023 were independently reviewed and  interpreted, showing disc height loss at L4/5. No other significant  degenerative changes. No fracture or dislocation seen.  No evidence of  instability on flexion/extension views.  Lordotic alignment.   PATIENT SURVEYS:  FOTO: Perceived function 45%, predicted 52%   SCREENING FOR RED FLAGS: Bowel or bladder incontinence: No Spinal tumors: No Cauda equina syndrome: No Compression fracture: No  COGNITION: Overall cognitive status: Within  functional limits for tasks assessed     SENSATION: WFL  MUSCLE LENGTH: Hamstrings: Right WNLs deg; Left WNLs deg Maisie Fus test: Right Tight deg; Left Tight deg  POSTURE: rounded shoulders, forward head, and increased lumbar lordosis  PALPATION: Repot of pressure to midline low back, paraspinal, PSIs, and gluteals  LUMBAR ROM:   AROM eval  Flexion Full, relief, use hands to walk up to return to standing  Extension Markedly limited, pain  Right lateral flexion Min limited, no pain  Left lateral flexion Min limited, pain R  Right rotation Full, no pain  Left rotation Full, pain R   (Blank rows = not tested)  LOWER EXTREMITY ROM:     WNLs Active  Right eval Left eval  Hip flexion    Hip extension    Hip abduction    Hip adduction    Hip internal rotation    Hip external rotation    Knee flexion    Knee extension    Ankle dorsiflexion    Ankle plantarflexion    Ankle inversion    Ankle eversion     (Blank rows = not tested)  LOWER EXTREMITY MMT:    Grossly 4+ to 5/5 and equal. Myotome screen neg  Weak core MMT Right eval Left eval  Hip flexion    Hip extension    Hip abduction    Hip adduction    Hip internal rotation    Hip external rotation    Knee flexion    Knee extension    Ankle dorsiflexion    Ankle plantarflexion    Ankle inversion    Ankle eversion     (Blank rows = not tested)  LUMBAR SPECIAL TESTS:  Straight leg raise test: Negative, Slump test: Negative, SI Compression/distraction test: Negative, and FABER test: Negative  FUNCTIONAL TESTS:  NT  GAIT: Distance walked: 200' Assistive device utilized: None Level of assistance: Complete Independence Comments: WNLs  TODAY'S TREATMENT: [facility] Adult PT Treatment:  DATE: 06/14/23 Therapeutic Exercise: Supine Posterior Pelvic Tilt  x 10  Marching with PPT 2x5 SLR c PPT using pilates circle  2x8 Supine Bridge c hip abd set c BluTB x10 Shoulder  rows x15 GTB Shoulder ext x15 GTB Therapeutic Activity: FOTO completed and results reviewed   [facility] Adult PT Treatment:                                                DATE: 06/11/23 Therapeutic Exercise: Supine Posterior Pelvic Tilt  x 10  Marching with PPT 2x5 SLR c PPT using pilates circle  2x8 Supine Bridge c hip abd set c BluTB Prone on pillows hip ext x10 each  [facility] Adult PT Treatment:                                                DATE: 06/07/23 Therapeutic Exercise: L stretch forward and laterally S/L open books x8 Shoulder rows x15 GTB Shoulder ext x15 GTB Slow marching on airex for core stability Manual Therapy: STM c MTPR to the R upper lumbar paraspinals Self Care: Instruction in and pt retuned demonstration for tennis ball massage on wall to low back  PATIENT EDUCATION:  Education details: Eval findings, POC, HEP, self care  Person educated: Patient Education method: Explanation, Demonstration, Tactile cues, Verbal cues, and Handouts Education comprehension: verbalized understanding, returned demonstration, verbal cues required, tactile cues required, and needs further education  HOME EXERCISE PROGRAM:  Access Code: [email or web address] URL: [email or web address] Date: 05/22/2023 Prepared by: Karie Mainland  Exercises - Supine Bridge  - 2 x daily - 7 x weekly - 1 sets - 10-15 reps - 5 hold - Supine Posterior Pelvic Tilt  - 2 x daily - 7 x weekly - 1 sets - 10-15 reps - 3 hold - Hooklying Clamshell with Resistance  - 2 x daily - 7 x weekly - 1 sets - 10-15 reps - 3 hold - Sit to Stand Without Arm Support  - 1 x daily - 7 x weekly - 2 sets - 10 reps - Standing Shoulder Row with Anchored Resistance  - 1 x daily - 7 x weekly - 2 sets - 10 reps - 5 hold - Shoulder extension with resistance - Neutral  - 1 x daily - 7 x weekly - 2 sets - 10 reps - 5 hold - Beginner Clam  - 1 x daily - 7 x weekly - 2 sets - 10 reps - 5 hold - Sidelying Hip Abduction  - 1 x daily - 7 x  weekly - 2 sets - 10 reps - 5 hold   ASSESSMENT:  CLINICAL IMPRESSION: Over the course of PT, pt has has made progress re: pain level, trunk mobility, and strength with most goals improved. Pt will continue to benefit from PT 1w6 to improve her core strength for better trunk stability.  OBJECTIVE IMPAIRMENTS: difficulty walking, decreased ROM, decreased strength, obesity, and pain.   ACTIVITY LIMITATIONS: carrying, lifting, bending, sitting, standing, squatting, sleeping, bathing, reach over head, hygiene/grooming, and locomotion level  PARTICIPATION LIMITATIONS: meal prep, cleaning, laundry, shopping, community activity, occupation, and yard work  PERSONAL FACTORS: Fitness, Past/current experiences, Time since onset of injury/illness/exacerbation, and 1 comorbidity: high BMI  are also  affecting patient's functional outcome.   REHAB POTENTIAL: Good  CLINICAL DECISION MAKING: Evolving/moderate complexity  EVALUATION COMPLEXITY: Moderate   GOALS:  SHORT TERM GOALS: Target date: 05/17/23  Pt will be Ind in an initial HEP  Baseline: 10/3: min cues  Goal status: ongoing   2.  Pt will voice understanding of measures to assist in pain reduction Baseline: started Goal status: MET   LONG TERM GOALS: Target date: 08/02/23  Pt will be Ind in a final HEP to maintain achieved LOF  Baseline:  Goal status: 06/14/23: Ongoing  2.  Improve trunk extension ROM to a min limitation for improved function Baseline: markedly imited 06/05/23: Mod limitation Goal status: IMPROVED  3.  Improve back/core strength to where the pt can sustain a bridge for 60 sec and forward plank from toes for 30 sec Baseline:  06/05/23: Bridge 32 sec; plank from knees 5 sec 06/14/23: 30" Goal status: ONGOING for bridge. Deferred for plank  4.  Pt will report a 50% improvement in low back pain with daily and work related activities for improved function and QOL Baseline: 5-8/10 05/29/23: 30%  improvement 06/14/23: 40% improvement Goal status: Improved , but not met  5.  Pt's FOTO score will improved to the predicted value of 52% as indication of improved function  Baseline: 45%, 05/24/23: 44% 06/14/23: 44% Goal status: Ongoing  PLAN:  PT FREQUENCY: 2x/week  PT DURATION: 6 weeks  PLANNED INTERVENTIONS: Therapeutic exercises, Therapeutic activity, Neuromuscular re-education, Balance training, Gait training, Patient/Family education, Self Care, Joint mobilization, Aquatic Therapy, Dry Needling, Electrical stimulation, Spinal mobilization, Cryotherapy, Moist heat, Taping, Traction, Ultrasound, Ionotophoresis 4mg /ml Dexamethasone, Manual therapy, and Re-evaluation.  PLAN FOR NEXT SESSION: standing balance and core/hip strength  Chaney Maclaren MS, PT 06/14/23 9:11 AM

## 2023-06-14 ENCOUNTER — Ambulatory Visit: Payer: Medicaid Other

## 2023-06-14 ENCOUNTER — Ambulatory Visit: Payer: Medicaid Other | Admitting: Student

## 2023-06-14 ENCOUNTER — Encounter: Payer: Self-pay | Admitting: Student

## 2023-06-14 VITALS — BP 137/81 | HR 72 | Temp 97.8°F | Ht 62.0 in | Wt 193.8 lb

## 2023-06-14 DIAGNOSIS — F32A Depression, unspecified: Secondary | ICD-10-CM

## 2023-06-14 DIAGNOSIS — M5459 Other low back pain: Secondary | ICD-10-CM

## 2023-06-14 DIAGNOSIS — Z7985 Long-term (current) use of injectable non-insulin antidiabetic drugs: Secondary | ICD-10-CM

## 2023-06-14 DIAGNOSIS — F330 Major depressive disorder, recurrent, mild: Secondary | ICD-10-CM

## 2023-06-14 DIAGNOSIS — M5432 Sciatica, left side: Secondary | ICD-10-CM

## 2023-06-14 DIAGNOSIS — E1165 Type 2 diabetes mellitus with hyperglycemia: Secondary | ICD-10-CM

## 2023-06-14 DIAGNOSIS — Z7984 Long term (current) use of oral hypoglycemic drugs: Secondary | ICD-10-CM | POA: Diagnosis not present

## 2023-06-14 DIAGNOSIS — Z0000 Encounter for general adult medical examination without abnormal findings: Secondary | ICD-10-CM

## 2023-06-14 DIAGNOSIS — I10 Essential (primary) hypertension: Secondary | ICD-10-CM | POA: Diagnosis not present

## 2023-06-14 DIAGNOSIS — M5431 Sciatica, right side: Secondary | ICD-10-CM | POA: Diagnosis not present

## 2023-06-14 LAB — POCT GLYCOSYLATED HEMOGLOBIN (HGB A1C): Hemoglobin A1C: 5.2 % (ref 4.0–5.6)

## 2023-06-14 LAB — GLUCOSE, CAPILLARY: Glucose-Capillary: 81 mg/dL (ref 70–99)

## 2023-06-14 NOTE — Progress Notes (Signed)
Established Patient Office Visit  Subjective   Patient ID: Regina Cantu, female    DOB: 1981-02-05  Age: 42 y.o. MRN: [id number]  Chief Complaint  Patient presents with   Medication Refill   Diabetes    Patient is a 42 yo with a past medical history stated below who presents today for follow-up for HTN, diabetes, depression, and healthcare maintenance. Please see problem based assessment and plan for additional details.     Past Medical History:  Diagnosis Date   Anxiety    Cancer (HCC)    ovarian 2001 and 2010   Depression    Essential hypertension 05/04/2022   Herniated disc, cervical 2011   Hypertension    Migraine    Pityriasis rosea    Uncontrolled type 2 diabetes mellitus with hyperglycemia (HCC) 05/04/2022   URI, acute 08/17/2022      Review of Systems  Constitutional:  Negative for chills and fever.  Cardiovascular:  Negative for chest pain, palpitations and leg swelling.  Gastrointestinal:  Negative for abdominal pain.  Genitourinary:  Negative for dysuria.  Neurological:  Negative for dizziness.      Objective:     BP 137/81 (BP Location: Right Arm, Patient Position: Sitting, Cuff Size: Normal)   Pulse 72   Temp 97.8 F (36.6 C) (Oral)   Ht 5\' 2"  (1.575 m)   Wt 193 lb 12.8 oz (87.9 kg)   SpO2 100%   BMI 35.45 kg/m  BP Readings from Last 3 Encounters:  06/14/23 137/81  04/15/23 101/66  04/01/23 119/71   Wt Readings from Last 3 Encounters:  06/14/23 193 lb 12.8 oz (87.9 kg)  05/01/23 194 lb 4.8 oz (88.1 kg)  04/15/23 193 lb 6.4 oz (87.7 kg)   Physical Exam HENT:     Head: Normocephalic and atraumatic.     Mouth/Throat:     Mouth: Mucous membranes are moist.  Cardiovascular:     Rate and Rhythm: Normal rate and regular rhythm.     Pulses: Normal pulses.     Heart sounds: Normal heart sounds.  Pulmonary:     Effort: Pulmonary effort is normal.     Breath sounds: Normal breath sounds.  Abdominal:     General: Bowel sounds are  normal.     Palpations: Abdomen is soft.  Skin:    General: Skin is warm.  Neurological:     General: No focal deficit present.     Mental Status: She is alert.  Psychiatric:        Mood and Affect: Mood normal.        Behavior: Behavior normal.    Results for orders placed or performed in visit on 06/14/23  Glucose, capillary  Result Value Ref Range   Glucose-Capillary 81 70 - 99 mg/dL  POC Hbg U1L  Result Value Ref Range   Hemoglobin A1C 5.2 4.0 - 5.6 %   HbA1c POC (<> result, manual entry)     HbA1c, POC (prediabetic range)     HbA1c, POC (controlled diabetic range)      Last hemoglobin A1c Lab Results  Component Value Date   HGBA1C 5.2 06/14/2023    The ASCVD Risk score (Arnett DK, et al., 2019) failed to calculate for the following reasons:   The patient has a prior MI or stroke diagnosis    Assessment & Plan:   Problem List Items Addressed This Visit     Uncontrolled type 2 diabetes mellitus with hyperglycemia (HCC) - Primary  Hgb A1c 5.2% today and CBG 81. Taking farxiga 5 mg daily and Trulicty 0.75 mg weekly injections on Thursdays. Patient has been feeling well other than intermittent episodes feeling like she may have low blood glucose. Does not have continuous glucose monitor due to insurance/cost limitations. Lupita Leash previously gave patient a sample Dexcom. Patient will call insurance given that CGM was previously approved for 1 year. Discussed possible cost ~78 for 2 15-day CGMs at [facility]. Patient does not like pricking finger for CBG, making it difficult to assess what glucose levels are during those episodes. Patient also endorses an irregular eating schedule given busy lifestyle. Will try to eat meals more regularly. Discussed possibly discontinuing Marcelline Deist and going up on Trulicity dose if microalbumin/creatinine urine test is not concerning for protein in urine. Patient agreeable.  - Continue farxiga 5 mg daily and Trulicty 0.75 mg weekly injections for now,  will call patient to adjust current regimen when microalbumin/creatinine urine lab results are available - Follow up in 3 months or earlier as needed      Relevant Orders   POC Hbg A1C (Completed)   Microalbumin / Creatinine Urine Ratio   Essential hypertension    Patient taking losartan-hydrochlorothiazide 50-12.5 mg daily and amlodipine 5 mg daily, tolerating well. BP today 137/81 which is slightly elevated from past BP pressures in the office, but patient states it has been a hectic morning. Denies any CP, headache, or swelling. Does endorse some lightheadedness from time to time but states it may be due to low glucose levels rather than her blood pressure. CV exam today unremarkale. Serum Cr WNL CMP from August visit, consider checking at next appointment. No medication changes to her anti-hypertensives at this time.  - Continue losartan-hydrochlorothiazide 50-12.5 mg daily and amlodipine 5 mg daily      Healthcare maintenance    Patient up to date on pap smear, due May 2025 (history of hysterectomy). Mammogram overdue, patient agreeable to referral.  - Placed referral for mammogram, follow up as needed      Relevant Orders   MM 3D SCREENING MAMMOGRAM BILATERAL BREAST   Depression    Patient reports mood is stable. Taking venlafaxine 150 mg with breakfast, tolerating well. Finished up sessions with Marena Chancy, has been receiving support from the [employer] where she sees [place] Arts administrator) and Tammy Sours (peer support doctor). Reports sessions are going well. No medication changes today. - Continue venlafaxine 150 mg daily        Return in about 3 months (around 09/14/2023) for Diabetes, HTN, depression follow up .  Patient seen with Dr. Lafonda Mosses.   Andreya Lacks Colbert Coyer, MD

## 2023-06-14 NOTE — Patient Instructions (Signed)
Thank you, [name] for allowing [place] to provide your care today. Today we discussed your diabetes, blood pressure, depression, and health maintenance.    I have ordered the following labs for you:   Lab Orders         Microalbumin / Creatinine Urine Ratio         Glucose, capillary         POC Hbg A1C      Tests ordered today:  - None  Referrals ordered today:   Referral Orders  No referral(s) requested today     I have ordered the following medication/changed the following medications:   Stop the following medications: There are no discontinued medications.   Start the following medications: No orders of the defined types were placed in this encounter.    Follow up: 3 months   Remember:   - I will call you with the results of your urine lab. If there is no protein in your urine we will STOP the [place] and go UP on the Trulicity injection from 0.75 mg weekly to 1.5 mg weekly.   - You will call your insurance company to help with the continuous glucose monitor.   - You will get a call to schedule your mammogram.   - We will see you in 3 months, no other changes were made to your medications today.  Should you have any questions or concerns please call the internal medicine clinic at [phone].     Paris [place] Colbert Coyer, MD PGY-1 Internal Medicine Teaching Progam [facility]

## 2023-06-15 LAB — MICROALBUMIN / CREATININE URINE RATIO
Creatinine, Urine: 154.7 mg/dL
Microalb/Creat Ratio: 11 mg/g{creat} (ref 0–29)
Microalbumin, Urine: 17.5 ug/mL

## 2023-06-15 NOTE — Assessment & Plan Note (Signed)
Patient reports mood is stable. Taking venlafaxine 150 mg with breakfast, tolerating well. Finished up sessions with Marena Chancy, has been receiving support from the [employer] where she sees [place] Arts administrator) and Tammy Sours (peer support doctor). Reports sessions are going well. No medication changes today. - Continue venlafaxine 150 mg daily

## 2023-06-15 NOTE — Assessment & Plan Note (Addendum)
Hgb A1c 5.2% today and CBG 81. Taking farxiga 5 mg daily and Trulicty 0.75 mg weekly injections on Thursdays. Patient has been feeling well other than intermittent episodes feeling like she may have low blood glucose. Does not have continuous glucose monitor due to insurance/cost limitations. Lupita Leash previously gave patient a sample Dexcom. Patient will call insurance given that CGM was previously approved for 1 year. Discussed possible cost ~78 for 2 15-day CGMs [facility]. Patient does not like pricking finger for CBG, making it difficult to assess what glucose levels are during those episodes. Patient also endorses an irregular eating schedule given busy lifestyle. Will try to eat meals more regularly. Discussed possibly discontinuing Marcelline Deist and going up on Trulicity dose if microalbumin/creatinine urine test is not concerning for protein in urine. Patient agreeable.  - Continue farxiga 5 mg daily and Trulicty 0.75 mg weekly injections for now, will call patient to adjust current regimen when microalbumin/creatinine urine lab results are available - Follow up in 3 months or earlier as needed

## 2023-06-15 NOTE — Assessment & Plan Note (Signed)
Patient up to date on pap smear, due May 2025 (history of hysterectomy). Mammogram overdue, patient agreeable to referral.  - Placed referral for mammogram, follow up as needed

## 2023-06-15 NOTE — Assessment & Plan Note (Signed)
Patient taking losartan-hydrochlorothiazide 50-12.5 mg daily and amlodipine 5 mg daily, tolerating well. BP today 137/81 which is slightly elevated from past BP pressures in the office, but patient states it has been a hectic morning. Denies any CP, headache, or swelling. Does endorse some lightheadedness from time to time but states it may be due to low glucose levels rather than her blood pressure. CV exam today unremarkale. Serum Cr WNL CMP from August visit, consider checking at next appointment. No medication changes to her anti-hypertensives at this time.  - Continue losartan-hydrochlorothiazide 50-12.5 mg daily and amlodipine 5 mg daily

## 2023-06-18 NOTE — Progress Notes (Signed)
Internal Medicine Clinic Attending  I was physically present during the key portions of the resident provided service and participated in the medical decision making of patient's management care. I reviewed pertinent patient test results.  The assessment, diagnosis, and plan were formulated together and I agree with the documentation in the resident's note.  Mercie Eon, MD

## 2023-06-25 ENCOUNTER — Other Ambulatory Visit (HOSPITAL_COMMUNITY): Payer: Self-pay

## 2023-06-25 ENCOUNTER — Other Ambulatory Visit: Payer: Self-pay | Admitting: Student

## 2023-06-25 DIAGNOSIS — F330 Major depressive disorder, recurrent, mild: Secondary | ICD-10-CM

## 2023-06-25 MED ORDER — VENLAFAXINE HCL ER 150 MG PO CP24
150.0000 mg | ORAL_CAPSULE | Freq: Every day | ORAL | 1 refills | Status: DC
  Filled 2023-06-25: qty 30, 30d supply, fill #0
  Filled 2023-07-07 – 2023-08-01 (×2): qty 30, 30d supply, fill #1

## 2023-06-26 ENCOUNTER — Ambulatory Visit: Payer: Medicaid Other

## 2023-06-27 ENCOUNTER — Encounter (HOSPITAL_BASED_OUTPATIENT_CLINIC_OR_DEPARTMENT_OTHER): Payer: Self-pay | Admitting: Radiology

## 2023-06-27 ENCOUNTER — Ambulatory Visit (HOSPITAL_BASED_OUTPATIENT_CLINIC_OR_DEPARTMENT_OTHER)
Admission: RE | Admit: 2023-06-27 | Discharge: 2023-06-27 | Disposition: A | Discharge status: Home / Self Care | Payer: Medicaid Other | Source: Ambulatory Visit | Attending: Internal Medicine | Admitting: Internal Medicine

## 2023-06-27 DIAGNOSIS — Z0000 Encounter for general adult medical examination without abnormal findings: Secondary | ICD-10-CM | POA: Diagnosis not present

## 2023-06-27 DIAGNOSIS — Z1231 Encounter for screening mammogram for malignant neoplasm of breast: Secondary | ICD-10-CM | POA: Diagnosis not present

## 2023-07-01 ENCOUNTER — Other Ambulatory Visit (HOSPITAL_COMMUNITY): Payer: Self-pay

## 2023-07-01 ENCOUNTER — Telehealth: Payer: Self-pay

## 2023-07-01 NOTE — Telephone Encounter (Signed)
Prior Authorization for patient (Nurtec 75MG  dispersible tablets) came through on cover my meds was submitted with last office notes awaiting approval or denial.  ZDG:[id number]

## 2023-07-01 NOTE — Telephone Encounter (Signed)
Decision:Denied Regina Cantu (Key: [id number]) PA Case ID #: [id number] Need Help? Call us at [phone] Outcome Denied today by [employer] 2017 Denied Drug Nurtec 75MG  dispersible tablets ePA cloud logo Form [employer] Electronic Prior Authorization Form

## 2023-07-01 NOTE — Therapy (Unsigned)
OUTPATIENT PHYSICAL THERAPY THORACOLUMBAR NOTE/Re-Cert   Patient Name: Mandy Guzinski MRN: [id number] DOB:November 13, 1980, 42 y.o., female Today's Date: 07/01/2023  END OF SESSION:              Past Medical History:  Diagnosis Date   Anxiety    Cancer (HCC)    ovarian 2001 and 2010   Depression    Essential hypertension 05/04/2022   Herniated disc, cervical 2011   Hypertension    Migraine    Pityriasis rosea    Uncontrolled type 2 diabetes mellitus with hyperglycemia (HCC) 05/04/2022   URI, acute 08/17/2022   Past Surgical History:  Procedure Laterality Date   ABDOMINAL HYSTERECTOMY     OVARIAN CANCER     Patient Active Problem List   Diagnosis Date Noted   Back pain 03/18/2023   Abnormal Pap smear of cervix 12/27/2022   Tobacco use 08/17/2022   Depression 08/17/2022   Healthcare maintenance 07/04/2022   Migraine 07/04/2022   History of CVA (cerebrovascular accident) 05/04/2022   History of ovarian cancer 05/04/2022   Uncontrolled type 2 diabetes mellitus with hyperglycemia (HCC) 05/04/2022   Essential hypertension 05/04/2022   Malignant (primary) neoplasm, unspecified (HCC) 05/24/2021   Other specified disorders of eustachian tube, bilateral 05/24/2021   Allergy, unspecified, initial encounter 05/24/2021    PCP: Chauncey Mann, DO  REFERRING PROVIDER: London Sheer, MD  REFERRING DIAG: M54.50 (ICD-10-CM) - Low back pain, unspecified back pain laterality, unspecified chronicity, unspecified whether sciatica presen   Rationale for Evaluation and Treatment: Rehabilitation  THERAPY DIAG:  No diagnosis found.  ONSET DATE: 30 years ago  SUBJECTIVE:                                                                                                                                                                                           SUBJECTIVE STATEMENT: Pt reports her low back overall is feeling better, approx 40%  PERTINENT HISTORY:  High  BMI, DM- uncontrolled, Stroke  PAIN:  Are you having pain? Yes: NPRS scale: 4/10. Pain range on eval: 5-8/10 Pain location: Low back to hips and bilateral knees Pain description: Throbbing, achy Aggravating factors: Prolonged standing, walking, sitting Relieving factors: Stretching, ocasional medications  PRECAUTIONS: None  RED FLAGS: None   WEIGHT BEARING RESTRICTIONS: No  FALLS:  Has patient fallen in last 6 months?  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 To mange pain better.  NEXT MD VISIT: 10/7 Dr. Christell Constant  OBJECTIVE:   DIAGNOSTIC FINDINGS:  XRs of the lumbar spine from 04/01/2023 were independently reviewed and  interpreted, showing disc height loss at L4/5. No other significant  degenerative changes. No fracture or dislocation seen.  No evidence of  instability on flexion/extension views.  Lordotic alignment.   PATIENT SURVEYS:  FOTO: Perceived function 45%, predicted 52%   SCREENING FOR RED FLAGS: Bowel or bladder incontinence: No Spinal tumors: No Cauda equina syndrome: No Compression fracture: No  COGNITION: Overall cognitive status: Within functional limits for tasks assessed     SENSATION: WFL  MUSCLE LENGTH: Hamstrings: Right WNLs deg; Left WNLs deg Maisie Fus test: Right Tight deg; Left Tight deg  POSTURE: rounded shoulders, forward head, and increased lumbar lordosis  PALPATION: Repot of pressure to midline low back, paraspinal, PSIs, and gluteals  LUMBAR ROM:   AROM eval  Flexion Full, relief, use hands to walk up to return to standing  Extension Markedly limited, pain  Right lateral flexion Min limited, no pain  Left lateral flexion Min limited, pain R  Right rotation  Full, no pain  Left rotation Full, pain R   (Blank rows = not tested)  LOWER EXTREMITY ROM:     WNLs Active  Right eval Left eval  Hip flexion    Hip extension    Hip abduction    Hip adduction    Hip internal rotation    Hip external rotation    Knee flexion    Knee extension    Ankle dorsiflexion    Ankle plantarflexion    Ankle inversion    Ankle eversion     (Blank rows = not tested)  LOWER EXTREMITY MMT:    Grossly 4+ to 5/5 and equal. Myotome screen neg  Weak core MMT Right eval Left eval  Hip flexion    Hip extension    Hip abduction    Hip adduction    Hip internal rotation    Hip external rotation    Knee flexion    Knee extension    Ankle dorsiflexion    Ankle plantarflexion    Ankle inversion    Ankle eversion     (Blank rows = not tested)  LUMBAR SPECIAL TESTS:  Straight leg raise test: Negative, Slump test: Negative, SI Compression/distraction test: Negative, and FABER test: Negative  FUNCTIONAL TESTS:  NT  GAIT: Distance walked: 200' Assistive device utilized: None Level of assistance: Complete Independence Comments: WNLs  TODAY'S TREATMENT:  [facility] Adult PT Treatment:                                                DATE: 07/01/23 Therapeutic Exercise: *** Manual Therapy: *** Neuromuscular re-ed: *** Therapeutic Activity: *** Modalities: *** Self Care: ***  [facility] Adult PT Treatment:                                                DATE: 06/26/23 Therapeutic Exercise: Supine Posterior Pelvic Tilt  x 10  Marching with PPT 2x5 SLR c PPT using pilates circle  2x8 Supine Bridge c hip abd set c BluTB x10 Shoulder rows x15 GTB Shoulder ext x15  GTB L stretch forward and laterally S/L open books x8 Shoulder rows x15 GTB Shoulder ext x15 GTB Slow marching on airex for core stability Manual Therapy: *** Neuromuscular re-ed: *** Therapeutic Activity: *** Modalities: *** Self Care: ***   [facility] Adult PT Treatment:                                                 DATE: 06/14/23 Therapeutic Exercise: Supine Posterior Pelvic Tilt  x 10  Marching with PPT 2x5 SLR c PPT using pilates circle  2x8 Supine Bridge c hip abd set c BluTB x10 Shoulder rows x15 GTB Shoulder ext x15 GTB Therapeutic Activity: FOTO completed and results reviewed   [facility] Adult PT Treatment:                                                DATE: 06/11/23 Therapeutic Exercise: Supine Posterior Pelvic Tilt  x 10  Marching with PPT 2x5 SLR c PPT using pilates circle  2x8 Supine Bridge c hip abd set c BluTB Prone on pillows hip ext x10 each  [facility] Adult PT Treatment:                                                DATE: 06/07/23 Therapeutic Exercise: L stretch forward and laterally S/L open books x8 Shoulder rows x15 GTB Shoulder ext x15 GTB Slow marching on airex for core stability Manual Therapy: STM c MTPR to the R upper lumbar paraspinals Self Care: Instruction in and pt retuned demonstration for tennis ball massage on wall to low back  PATIENT EDUCATION:  Education details: Eval findings, POC, HEP, self care  Person educated: Patient Education method: Explanation, Demonstration, Tactile cues, Verbal cues, and Handouts Education comprehension: verbalized understanding, returned demonstration, verbal cues required, tactile cues required, and needs further education  HOME EXERCISE PROGRAM:  Access Code: [email or web address] URL: [email or web address] Date: 05/22/2023 Prepared by: Karie Mainland  Exercises - Supine Bridge  - 2 x daily - 7 x weekly - 1 sets - 10-15 reps - 5 hold - Supine Posterior Pelvic Tilt  - 2 x daily - 7 x weekly - 1 sets - 10-15 reps - 3 hold - Hooklying Clamshell with Resistance  - 2 x daily - 7 x weekly - 1 sets - 10-15 reps - 3 hold - Sit to Stand Without Arm Support  - 1 x daily - 7 x weekly - 2 sets - 10 reps - Standing Shoulder Row with Anchored Resistance  - 1 x daily - 7 x weekly - 2 sets - 10 reps - 5 hold -  Shoulder extension with resistance - Neutral  - 1 x daily - 7 x weekly - 2 sets - 10 reps - 5 hold - Beginner Clam  - 1 x daily - 7 x weekly - 2 sets - 10 reps - 5 hold - Sidelying Hip Abduction  - 1 x daily - 7 x weekly - 2 sets - 10 reps - 5 hold   ASSESSMENT:  CLINICAL IMPRESSION: Over the course of PT, pt has has made progress  re: pain level, trunk mobility, and strength with most goals improved. Pt will continue to benefit from PT 1w6 to improve her core strength for better trunk stability.  OBJECTIVE IMPAIRMENTS: difficulty walking, decreased ROM, decreased strength, obesity, and pain.   ACTIVITY LIMITATIONS: carrying, lifting, bending, sitting, standing, squatting, sleeping, bathing, reach over head, hygiene/grooming, and locomotion level  PARTICIPATION LIMITATIONS: meal prep, cleaning, laundry, shopping, community activity, occupation, and yard work  PERSONAL FACTORS: Fitness, Past/current experiences, Time since onset of injury/illness/exacerbation, and 1 comorbidity: high BMI  are also affecting patient's functional outcome.   REHAB POTENTIAL: Good  CLINICAL DECISION MAKING: Evolving/moderate complexity  EVALUATION COMPLEXITY: Moderate   GOALS:  SHORT TERM GOALS: Target date: 05/17/23  Pt will be Ind in an initial HEP  Baseline: 10/3: min cues  Goal status: ongoing   2.  Pt will voice understanding of measures to assist in pain reduction Baseline: started Goal status: MET   LONG TERM GOALS: Target date: 08/02/23  Pt will be Ind in a final HEP to maintain achieved LOF  Baseline:  Goal status: 06/14/23: Ongoing  2.  Improve trunk extension ROM to a min limitation for improved function Baseline: markedly imited 06/05/23: Mod limitation Goal status: IMPROVED  3.  Improve back/core strength to where the pt can sustain a bridge for 60 sec and forward plank from toes for 30 sec Baseline:  06/05/23: Bridge 32 sec; plank from knees 5 sec 06/14/23: 30" Goal status:  ONGOING for bridge. Deferred for plank  4.  Pt will report a 50% improvement in low back pain with daily and work related activities for improved function and QOL Baseline: 5-8/10 05/29/23: 30% improvement 06/14/23: 40% improvement Goal status: Improved , but not met  5.  Pt's FOTO score will improved to the predicted value of 52% as indication of improved function  Baseline: 45%, 05/24/23: 44% 06/14/23: 44% Goal status: Ongoing  PLAN:  PT FREQUENCY: 2x/week  PT DURATION: 6 weeks  PLANNED INTERVENTIONS: Therapeutic exercises, Therapeutic activity, Neuromuscular re-education, Balance training, Gait training, Patient/Family education, Self Care, Joint mobilization, Aquatic Therapy, Dry Needling, Electrical stimulation, Spinal mobilization, Cryotherapy, Moist heat, Taping, Traction, Ultrasound, Ionotophoresis 4mg /ml Dexamethasone, Manual therapy, and Re-evaluation.  PLAN FOR NEXT SESSION: standing balance and core/hip strength  Joellyn Rued MS, PT 07/01/23 4:05 PM

## 2023-07-02 ENCOUNTER — Encounter: Payer: Self-pay | Admitting: Physical Therapy

## 2023-07-02 ENCOUNTER — Ambulatory Visit: Payer: Medicaid Other | Admitting: Physical Therapy

## 2023-07-02 DIAGNOSIS — M5431 Sciatica, right side: Secondary | ICD-10-CM

## 2023-07-02 DIAGNOSIS — M5432 Sciatica, left side: Secondary | ICD-10-CM | POA: Diagnosis not present

## 2023-07-02 DIAGNOSIS — M5459 Other low back pain: Secondary | ICD-10-CM

## 2023-07-07 ENCOUNTER — Other Ambulatory Visit (HOSPITAL_COMMUNITY): Payer: Self-pay

## 2023-07-08 ENCOUNTER — Other Ambulatory Visit (HOSPITAL_COMMUNITY): Payer: Self-pay

## 2023-07-09 NOTE — Therapy (Unsigned)
OUTPATIENT PHYSICAL THERAPY THORACOLUMBAR NOTE   Patient Name: Regina Cantu MRN: [id number] DOB:30-Jul-1981, 42 y.o., female Today's Date: 07/10/2023  END OF SESSION:  PT End of Session - 07/10/23 1022     Visit Number 14    Number of Visits 18    Date for PT Re-Evaluation 08/02/23    Authorization Type [place] MEDICAID [employer] [place]    PT Start Time 1020    PT Stop Time 1101    PT Time Calculation (min) 41 min    Activity Tolerance Patient tolerated treatment well    Behavior During Therapy WFL for tasks assessed/performed                Past Medical History:  Diagnosis Date   Anxiety    Cancer (HCC)    ovarian 2001 and 2010   Depression    Essential hypertension 05/04/2022   Herniated disc, cervical 2011   Hypertension    Migraine    Pityriasis rosea    Uncontrolled type 2 diabetes mellitus with hyperglycemia (HCC) 05/04/2022   URI, acute 08/17/2022   Past Surgical History:  Procedure Laterality Date   ABDOMINAL HYSTERECTOMY     OVARIAN CANCER     Patient Active Problem List   Diagnosis Date Noted   Back pain 03/18/2023   Abnormal Pap smear of cervix 12/27/2022   Tobacco use 08/17/2022   Depression 08/17/2022   Healthcare maintenance 07/04/2022   Migraine 07/04/2022   History of CVA (cerebrovascular accident) 05/04/2022   History of ovarian cancer 05/04/2022   Uncontrolled type 2 diabetes mellitus with hyperglycemia (HCC) 05/04/2022   Essential hypertension 05/04/2022   Malignant (primary) neoplasm, unspecified (HCC) 05/24/2021   Other specified disorders of eustachian tube, bilateral 05/24/2021   Allergy, unspecified, initial encounter 05/24/2021    PCP: Chauncey Mann, DO  REFERRING PROVIDER: London Sheer, MD  REFERRING DIAG: M54.50 (ICD-10-CM) - Low back pain, unspecified back pain laterality, unspecified chronicity, unspecified whether sciatica presen   Rationale for Evaluation and Treatment: Rehabilitation  THERAPY DIAG:   Other low back pain  Sciatica, left side  Sciatica, right side  ONSET DATE: 30 years ago  SUBJECTIVE:                                                                                                                                                                                           SUBJECTIVE STATEMENT: "I think I jammed my hip" Shoulders, L hip. 7/10.  The exercises are excruciating.  I don't take time to rest. I need the PT to help me feel if I'm doing it right. Sees Dr. Christell Constant  08/01/23.   Pt woke up with severe pain in bilateral shoulders patient was unable to use her left arm at all in order to get out of bed.  She reports her knees have been popping quite a bit, she is not wearing the brace but did use compression stockings which did help yesterday overall her back is a little bit better reports her shoulder is more painful than anything today at 8/10.  She plans to call her doctor and get a appointment with the orthopedic Dr. Christell Constant.  He has not taken any medicine today for this issue  PERTINENT HISTORY:  High BMI, DM- uncontrolled, Stroke  PAIN:  Are you having pain? Yes: NPRS scale: 7/10 Pain location: Low back to hips and bilateral knees Pain description: Throbbing, achy Aggravating factors: Prolonged standing, walking, sitting Relieving factors: Stretching, ocasional medications  07/02/23:  Shoulder pain 8/10 Popping, unstable    PRECAUTIONS: None  RED FLAGS: None   WEIGHT BEARING RESTRICTIONS: No  FALLS:  Has patient fallen in last 6 months?  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 To mange pain better.  NEXT MD VISIT: 10/7 Dr. Christell Constant  OBJECTIVE:   DIAGNOSTIC FINDINGS:  XRs of the  lumbar spine from 04/01/2023 were independently reviewed and  interpreted, showing disc height loss at L4/5. No other significant  degenerative changes. No fracture or dislocation seen.  No evidence of  instability on flexion/extension views.  Lordotic alignment.   PATIENT SURVEYS:  FOTO: Perceived function 45%, predicted 52%   SCREENING FOR RED FLAGS: Bowel or bladder incontinence: No Spinal tumors: No Cauda equina syndrome: No Compression fracture: No  COGNITION: Overall cognitive status: Within functional limits for tasks assessed     SENSATION: WFL  MUSCLE LENGTH: Hamstrings: Right WNLs deg; Left WNLs deg Maisie Fus test: Right Tight deg; Left Tight deg  POSTURE: rounded shoulders, forward head, and increased lumbar lordosis  PALPATION: Repot of pressure to midline low back, paraspinal, PSIs, and gluteals  LUMBAR ROM:   AROM eval  Flexion Full, relief, use hands to walk up to return to standing  Extension Markedly limited, pain  Right lateral flexion Min limited, no pain  Left lateral flexion Min limited, pain R  Right rotation Full, no pain  Left rotation Full, pain R   (Blank rows = not tested)  LOWER EXTREMITY ROM:     WNLs Active  Right eval Left eval  Hip flexion    Hip extension    Hip abduction    Hip adduction    Hip internal rotation    Hip external rotation    Knee flexion    Knee extension    Ankle dorsiflexion    Ankle plantarflexion    Ankle inversion    Ankle eversion     (Blank rows = not tested)  LOWER EXTREMITY MMT:    Grossly 4+ to 5/5 and equal. Myotome screen neg  Weak core MMT Right eval Left eval  Hip flexion    Hip extension    Hip abduction    Hip adduction    Hip internal rotation    Hip external rotation    Knee flexion    Knee extension    Ankle dorsiflexion    Ankle plantarflexion  Ankle inversion    Ankle eversion     (Blank rows = not tested)  LUMBAR SPECIAL TESTS:  Straight leg raise test: Negative, Slump  test: Negative, SI Compression/distraction test: Negative, and FABER test: Negative  FUNCTIONAL TESTS:  NT  GAIT: Distance walked: 200' Assistive device utilized: None Level of assistance: Complete Independence Comments: WNLs  TODAY'S TREATMENT:   [facility] Adult PT Treatment:                                                DATE: 07/10/23 Therapeutic Exercise: Leg lengthener LLE x 10  Knee to chest  x 2  Sit to stand x 15  Step ups x  15 with opp knee lift (less hip pain than she got here) (6 inch)  Reverse step taps (6 inch)  Hip abd off step x 15  Sidelying L Ql stretch Manual Therapy: Bilateral patellar taping today using McConnell tape pulling patella medially. Showed patient how to do this on her own LAD LLE  Sidelying pressure to L QL during stretch position     [facility] Adult PT Treatment:                                                DATE: 07/02/23 Therapeutic Exercise: Supine Posterior Pelvic Tilt  x 10  Marching with PPT 2x5 Knee extension for quad warm up with towel under knee  SLR x 10 toes up , x 10 toe out  Slow marching on airex for core stability Tandem stance on Airex  Narrow stance EC Airex  EO head turns Airex  Manual therapy:  Bilateral patellar taping today using McConnell tape pulling patella medially.   [facility] Adult PT Treatment:                                                DATE: 06/14/23 Therapeutic Exercise: Supine Posterior Pelvic Tilt  x 10  Marching with PPT 2x5 SLR c PPT using pilates circle  2x8 Supine Bridge c hip abd set c BluTB x10 Shoulder rows x15 GTB Shoulder ext x15 GTB Therapeutic Activity: FOTO completed and results reviewed   [facility] Adult PT Treatment:                                                DATE: 06/11/23 Therapeutic Exercise: Supine Posterior Pelvic Tilt  x 10  Marching with PPT 2x5 SLR c PPT using pilates circle  2x8 Supine Bridge c hip abd set c BluTB Prone on pillows hip ext x10 each  [facility] Adult PT Treatment:                                                 DATE: 06/07/23 Therapeutic Exercise: L stretch forward and laterally S/L open books x8 Shoulder rows x15 GTB Shoulder ext x15  GTB Slow marching on airex for core stability Manual Therapy: STM c MTPR to the R upper lumbar paraspinals Self Care: Instruction in and pt retuned demonstration for tennis ball massage on wall to low back  PATIENT EDUCATION:  Education details: taping, crepitus  Person educated: Patient Education method: Explanation, Demonstration, Tactile cues, Verbal cues, and Handouts Education comprehension: verbalized understanding, returned demonstration, verbal cues required, tactile cues required, and needs further education  HOME EXERCISE PROGRAM:  Access Code: [email or web address] URL: [email or web address] Date: 05/22/2023 Prepared by: Karie Mainland  Exercises - Supine Bridge  - 2 x daily - 7 x weekly - 1 sets - 10-15 reps - 5 hold - Supine Posterior Pelvic Tilt  - 2 x daily - 7 x weekly - 1 sets - 10-15 reps - 3 hold - Hooklying Clamshell with Resistance  - 2 x daily - 7 x weekly - 1 sets - 10-15 reps - 3 hold - Sit to Stand Without Arm Support  - 1 x daily - 7 x weekly - 2 sets - 10 reps - Standing Shoulder Row with Anchored Resistance  - 1 x daily - 7 x weekly - 2 sets - 10 reps - 5 hold - Shoulder extension with resistance - Neutral  - 1 x daily - 7 x weekly - 2 sets - 10 reps - 5 hold - Beginner Clam  - 1 x daily - 7 x weekly - 2 sets - 10 reps - 5 hold - Sidelying Hip Abduction  - 1 x daily - 7 x weekly - 2 sets - 10 reps - 5 hold -QL   ASSESSMENT:  CLINICAL IMPRESSION: Patient cont to have L sided back and hip pain today, 10/10 at times.  Reduced to 4/10 after basic stretching and McConnell tape to knees. Pt found a position of stretch and release to L lumbar in sidelying. L QL in spasm, released with lengthening exercise. Cont POC.    OBJECTIVE IMPAIRMENTS: difficulty walking, decreased ROM, decreased  strength, obesity, and pain.   ACTIVITY LIMITATIONS: carrying, lifting, bending, sitting, standing, squatting, sleeping, bathing, reach over head, hygiene/grooming, and locomotion level  PARTICIPATION LIMITATIONS: meal prep, cleaning, laundry, shopping, community activity, occupation, and yard work  PERSONAL FACTORS: Fitness, Past/current experiences, Time since onset of injury/illness/exacerbation, and 1 comorbidity: high BMI  are also affecting patient's functional outcome.   REHAB POTENTIAL: Good  CLINICAL DECISION MAKING: Evolving/moderate complexity  EVALUATION COMPLEXITY: Moderate   GOALS:  SHORT TERM GOALS: Target date: 05/17/23  Pt will be Ind in an initial HEP  Baseline: 10/3: min cues  Goal status: ongoing   2.  Pt will voice understanding of measures to assist in pain reduction Baseline: started Goal status: MET   LONG TERM GOALS: Target date: 08/02/23  Pt will be Ind in a final HEP to maintain achieved LOF  Baseline:  Goal status: 06/14/23: Ongoing  2.  Improve trunk extension ROM to a min limitation for improved function Baseline: markedly imited 06/05/23: Mod limitation Goal status: IMPROVED  3.  Improve back/core strength to where the pt can sustain a bridge for 60 sec and forward plank from toes for 30 sec Baseline:  06/05/23: Bridge 32 sec; plank from knees 5 sec 06/14/23: 30" Goal status: ONGOING for bridge. Deferred for plank  4.  Pt will report a 50% improvement in low back pain with daily and work related activities for improved function and QOL Baseline: 5-8/10 05/29/23: 30% improvement 06/14/23: 40% improvement Goal status: Improved , but not met  5.  Pt's FOTO score will improved to the predicted value of 52% as indication of improved function  Baseline: 45%, 05/24/23: 44% 06/14/23: 44% Goal status: Ongoing  PLAN:  PT FREQUENCY: 2x/week  PT DURATION: 6 weeks  PLANNED INTERVENTIONS: Therapeutic exercises, Therapeutic activity,  Neuromuscular re-education, Balance training, Gait training, Patient/Family education, Self Care, Joint mobilization, Aquatic Therapy, Dry Needling, Electrical stimulation, Spinal mobilization, Cryotherapy, Moist heat, Taping, Traction, Ultrasound, Ionotophoresis 4mg /ml Dexamethasone, Manual therapy, and Re-evaluation.  PLAN FOR NEXT SESSION: tape knees? Core strength, L QL manual?   Karie Mainland, PT 07/10/23 11:06 AM Phone: [phone] Fax: [phone]

## 2023-07-10 ENCOUNTER — Ambulatory Visit: Payer: Medicaid Other | Attending: Orthopedic Surgery | Admitting: Physical Therapy

## 2023-07-10 ENCOUNTER — Encounter: Payer: Self-pay | Admitting: Physical Therapy

## 2023-07-10 DIAGNOSIS — M5459 Other low back pain: Secondary | ICD-10-CM | POA: Diagnosis not present

## 2023-07-10 DIAGNOSIS — M5431 Sciatica, right side: Secondary | ICD-10-CM | POA: Insufficient documentation

## 2023-07-10 DIAGNOSIS — M5432 Sciatica, left side: Secondary | ICD-10-CM | POA: Insufficient documentation

## 2023-07-17 ENCOUNTER — Encounter: Payer: Self-pay | Admitting: Physical Therapy

## 2023-07-17 ENCOUNTER — Ambulatory Visit: Payer: Medicaid Other | Admitting: Physical Therapy

## 2023-07-17 DIAGNOSIS — M5431 Sciatica, right side: Secondary | ICD-10-CM

## 2023-07-17 DIAGNOSIS — M5432 Sciatica, left side: Secondary | ICD-10-CM | POA: Diagnosis not present

## 2023-07-17 DIAGNOSIS — M5459 Other low back pain: Secondary | ICD-10-CM

## 2023-07-17 NOTE — Therapy (Signed)
OUTPATIENT PHYSICAL THERAPY THORACOLUMBAR NOTE   Patient Name: Regina Cantu MRN: [id number] DOB:11/13/1980, 42 y.o., female Today's Date: 07/17/2023  END OF SESSION:  PT End of Session - 07/17/23 0938     Visit Number 15    Number of Visits 18    Date for PT Re-Evaluation 08/02/23    Authorization Type [place] MEDICAID [employer] OF [place]    PT Start Time 0935    PT Stop Time 1019    PT Time Calculation (min) 44 min    Activity Tolerance Patient tolerated treatment well    Behavior During Therapy [facility] for tasks assessed/performed                 Past Medical History:  Diagnosis Date   Anxiety    Cancer (HCC)    ovarian 2001 and 2010   Depression    Essential hypertension 05/04/2022   Herniated disc, cervical 2011   Hypertension    Migraine    Pityriasis rosea    Uncontrolled type 2 diabetes mellitus with hyperglycemia (HCC) 05/04/2022   URI, acute 08/17/2022   Past Surgical History:  Procedure Laterality Date   ABDOMINAL HYSTERECTOMY     OVARIAN CANCER     Patient Active Problem List   Diagnosis Date Noted   Back pain 03/18/2023   Abnormal Pap smear of cervix 12/27/2022   Tobacco use 08/17/2022   Depression 08/17/2022   Healthcare maintenance 07/04/2022   Migraine 07/04/2022   History of CVA (cerebrovascular accident) 05/04/2022   History of ovarian cancer 05/04/2022   Uncontrolled type 2 diabetes mellitus with hyperglycemia (HCC) 05/04/2022   Essential hypertension 05/04/2022   Malignant (primary) neoplasm, unspecified (HCC) 05/24/2021   Other specified disorders of eustachian tube, bilateral 05/24/2021   Allergy, unspecified, initial encounter 05/24/2021    PCP: Chauncey Mann, DO  REFERRING PROVIDER: London Sheer, MD  REFERRING DIAG: M54.50 (ICD-10-CM) - Low back pain, unspecified back pain laterality, unspecified chronicity, unspecified whether sciatica presen   Rationale for Evaluation and Treatment: Rehabilitation  THERAPY  DIAG:  Other low back pain  Sciatica, right side  Sciatica, left side  ONSET DATE: 30 years ago  SUBJECTIVE:                                                                                                                                                                                           SUBJECTIVE STATEMENT: The stretches have been helping the hip a lot.   I bought some extra strength Tylenol and that helped the L hip pain .  Today she reports 9/10 pain in her spine.  The weather  and the pressure has made everything hurt. Taping her knees every other day and that helps the knees.  They aren't popping 4/10.   PERTINENT HISTORY:  High BMI, DM- uncontrolled, Stroke  PAIN:  Are you having pain? Yes: NPRS scale: 9/10 Pain location: Low back to hips and bilateral knees Pain description: Throbbing, achy Aggravating factors: Prolonged standing, walking, sitting Relieving factors: Stretching, ocasional medications   07/02/23:  Shoulder pain 8/10 Popping, unstable    PRECAUTIONS: None  RED FLAGS: None   WEIGHT BEARING RESTRICTIONS: No  FALLS:  Has patient fallen in last 6 months?  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 To mange pain better.  NEXT MD VISIT: 10/7 Dr. Christell Constant  OBJECTIVE:   DIAGNOSTIC FINDINGS:  XRs of the lumbar spine from 04/01/2023 were independently reviewed and  interpreted, showing disc height loss at L4/5. No other significant  degenerative changes. No fracture or dislocation seen.  No evidence of  instability on flexion/extension views.  Lordotic alignment.   PATIENT SURVEYS:  FOTO: Perceived function 45%, predicted 52%   SCREENING FOR RED FLAGS: Bowel or bladder  incontinence: No Spinal tumors: No Cauda equina syndrome: No Compression fracture: No  COGNITION: Overall cognitive status: Within functional limits for tasks assessed     SENSATION: WFL  MUSCLE LENGTH: Hamstrings: Right WNLs deg; Left WNLs deg Maisie Fus test: Right Tight deg; Left Tight deg  POSTURE: rounded shoulders, forward head, and increased lumbar lordosis  PALPATION: Repot of pressure to midline low back, paraspinal, PSIs, and gluteals  LUMBAR ROM:   AROM eval  Flexion Full, relief, use hands to walk up to return to standing  Extension Markedly limited, pain  Right lateral flexion Min limited, no pain  Left lateral flexion Min limited, pain R  Right rotation Full, no pain  Left rotation Full, pain R   (Blank rows = not tested)  LOWER EXTREMITY ROM:     WNLs Active  Right eval Left eval  Hip flexion    Hip extension    Hip abduction    Hip adduction    Hip internal rotation    Hip external rotation    Knee flexion    Knee extension    Ankle dorsiflexion    Ankle plantarflexion    Ankle inversion    Ankle eversion     (Blank rows = not tested)  LOWER EXTREMITY MMT:    Grossly 4+ to 5/5 and equal. Myotome screen neg  Weak core MMT Right eval Left eval  Hip flexion    Hip extension    Hip abduction    Hip adduction    Hip internal rotation    Hip external rotation    Knee flexion    Knee extension    Ankle dorsiflexion    Ankle plantarflexion    Ankle inversion    Ankle eversion     (Blank rows = not tested)  LUMBAR SPECIAL TESTS:  Straight leg raise test: Negative, Slump test: Negative, SI Compression/distraction test: Negative, and FABER test: Negative  FUNCTIONAL TESTS:  NT  GAIT: Distance walked: 200' Assistive device utilized: None Level of assistance: Complete Independence Comments: WNLs  TODAY'S TREATMENT:  [facility] Adult PT Treatment:  DATE: 07/17/23 Therapeutic Exercise: Supine  stretching LTR x 10 Bridge wide x 10  Bridge with ball x 10 (for alignment)  Ball squeeze with knee extension x 10 each  SLR flexion and abduction x 10 each side (small ROM)  Bridge with march holding 10 lbs 90/90 core hold 30 sec (towel under low back)  Added 10 lbs x 30 sec  Added alt UE lift x 10  Alt knee extension x 10  Dead bug x 10 each side  Squat x 10 to mat table  Standing blue band shoulder extension hold with high march  Palloff press double blue anti-rot Added rotation away x 10       [facility] Adult PT Treatment:                                                DATE: 07/10/23 Therapeutic Exercise: Leg lengthener LLE x 10  Knee to chest  x 2  Sit to stand x 15  Step ups x  15 with opp knee lift (less hip pain than she got here) (6 inch)  Reverse step taps (6 inch)  Hip abd off step x 15  Sidelying L Ql stretch Manual Therapy: Bilateral patellar taping today using McConnell tape pulling patella medially. Showed patient how to do this on her own LAD LLE  Sidelying pressure to L QL during stretch position     [facility] Adult PT Treatment:                                                DATE: 07/02/23 Therapeutic Exercise: Supine Posterior Pelvic Tilt  x 10  Marching with PPT 2x5 Knee extension for quad warm up with towel under knee  SLR x 10 toes up , x 10 toe out  Slow marching on airex for core stability Tandem stance on Airex  Narrow stance EC Airex  EO head turns Airex  Manual therapy:  Bilateral patellar taping today using McConnell tape pulling patella medially.   [facility] Adult PT Treatment:                                                DATE: 06/14/23 Therapeutic Exercise: Supine Posterior Pelvic Tilt  x 10  Marching with PPT 2x5 SLR c PPT using pilates circle  2x8 Supine Bridge c hip abd set c BluTB x10 Shoulder rows x15 GTB Shoulder ext x15 GTB Therapeutic Activity: FOTO completed and results reviewed   [facility] Adult PT Treatment:                                                 DATE: 06/11/23 Therapeutic Exercise: Supine Posterior Pelvic Tilt  x 10  Marching with PPT 2x5 SLR c PPT using pilates circle  2x8 Supine Bridge c hip abd set c BluTB Prone on pillows hip ext x10 each  [facility] Adult PT Treatment:  DATE: 06/07/23 Therapeutic Exercise: L stretch forward and laterally S/L open books x8 Shoulder rows x15 GTB Shoulder ext x15 GTB Slow marching on airex for core stability Manual Therapy: STM c MTPR to the R upper lumbar paraspinals Self Care: Instruction in and pt retuned demonstration for tennis ball massage on wall to low back  PATIENT EDUCATION:  Education details: hypermobility syndrome and stability  Person educated: Patient Education method: Explanation, Demonstration, Tactile cues, Verbal cues, and Handouts Education comprehension: verbalized understanding, returned demonstration, verbal cues required, tactile cues required, and needs further education  HOME EXERCISE PROGRAM:  Access Code: [email or web address] URL: [email or web address] Date: 05/22/2023 Prepared by: Karie Mainland  Exercises - Supine Bridge  - 2 x daily - 7 x weekly - 1 sets - 10-15 reps - 5 hold - Supine Posterior Pelvic Tilt  - 2 x daily - 7 x weekly - 1 sets - 10-15 reps - 3 hold - Hooklying Clamshell with Resistance  - 2 x daily - 7 x weekly - 1 sets - 10-15 reps - 3 hold - Sit to Stand Without Arm Support  - 1 x daily - 7 x weekly - 2 sets - 10 reps - Standing Shoulder Row with Anchored Resistance  - 1 x daily - 7 x weekly - 2 sets - 10 reps - 5 hold - Shoulder extension with resistance - Neutral  - 1 x daily - 7 x weekly - 2 sets - 10 reps - 5 hold - Beginner Clam  - 1 x daily - 7 x weekly - 2 sets - 10 reps - 5 hold - Sidelying Hip Abduction  - 1 x daily - 7 x weekly - 2 sets - 10 reps - 5 hold -QL   ASSESSMENT:  CLINICAL IMPRESSION: Patient continues to have high levels of pain despite tolerating  exercises on mat and standing well.  She has multiple areas of hypermobility (spine, shoulders, hips, knees) and needs cues to control her ROM and body position.  Her pain went to 5/10 after the session.  Spent time educating on causes of pain in joint hypermobility and the need for muscular support to improve function.     OBJECTIVE IMPAIRMENTS: difficulty walking, decreased ROM, decreased strength, obesity, and pain.   ACTIVITY LIMITATIONS: carrying, lifting, bending, sitting, standing, squatting, sleeping, bathing, reach over head, hygiene/grooming, and locomotion level  PARTICIPATION LIMITATIONS: meal prep, cleaning, laundry, shopping, community activity, occupation, and yard work  PERSONAL FACTORS: Fitness, Past/current experiences, Time since onset of injury/illness/exacerbation, and 1 comorbidity: high BMI  are also affecting patient's functional outcome.   REHAB POTENTIAL: Good  CLINICAL DECISION MAKING: Evolving/moderate complexity  EVALUATION COMPLEXITY: Moderate   GOALS:  SHORT TERM GOALS: Target date: 05/17/23  Pt will be Ind in an initial HEP  Baseline: 10/3: min cues  Goal status: MET   2.  Pt will voice understanding of measures to assist in pain reduction Baseline: started Goal status: MET   LONG TERM GOALS: Target date: 08/02/23  Pt will be Ind in a final HEP to maintain achieved LOF  Baseline:  Goal status: 06/14/23: Ongoing  2.  Improve trunk extension ROM to a min limitation for improved function Baseline: markedly imited 06/05/23: Mod limitation Goal status: IMPROVED  3.  Improve back/core strength to where the pt can sustain a bridge for 60 sec and forward plank from toes for 30 sec Baseline:  06/05/23: Bridge 32 sec; plank from knees 5 sec 06/14/23: 30" Goal status: ONGOING for bridge. Deferred for  plank  4.  Pt will report a 50% improvement in low back pain with daily and work related activities for improved function and QOL Baseline:  5-8/10 05/29/23: 30% improvement 06/14/23: 40% improvement Goal status: Improved , but not met  5.  Pt's FOTO score will improved to the predicted value of 52% as indication of improved function  Baseline: 45%, 05/24/23: 44% 06/14/23: 44% Goal status: Ongoing  PLAN:  PT FREQUENCY: 2x/week  PT DURATION: 6 weeks  PLANNED INTERVENTIONS: Therapeutic exercises, Therapeutic activity, Neuromuscular re-education, Balance training, Gait training, Patient/Family education, Self Care, Joint mobilization, Aquatic Therapy, Dry Needling, Electrical stimulation, Spinal mobilization, Cryotherapy, Moist heat, Taping, Traction, Ultrasound, Ionotophoresis 4mg /ml Dexamethasone, Manual therapy, and Re-evaluation.  PLAN FOR NEXT SESSION: Cont core and scapula stability   Karie Mainland, PT 07/17/23 10:39 AM Phone: [phone] Fax: [phone]

## 2023-07-22 ENCOUNTER — Other Ambulatory Visit (HOSPITAL_COMMUNITY): Payer: Self-pay

## 2023-07-22 ENCOUNTER — Other Ambulatory Visit: Payer: Self-pay | Admitting: Student

## 2023-07-22 MED ORDER — TRULICITY 0.75 MG/0.5ML ~~LOC~~ SOAJ
0.7500 mg | SUBCUTANEOUS | 2 refills | Status: DC
  Filled 2023-07-22: qty 2, 28d supply, fill #0
  Filled 2023-08-26: qty 2, 28d supply, fill #1
  Filled 2023-09-23: qty 2, 28d supply, fill #2

## 2023-07-23 ENCOUNTER — Other Ambulatory Visit: Payer: Self-pay

## 2023-07-24 ENCOUNTER — Encounter: Payer: Self-pay | Admitting: Physical Therapy

## 2023-07-24 ENCOUNTER — Ambulatory Visit: Payer: Medicaid Other | Admitting: Physical Therapy

## 2023-07-24 DIAGNOSIS — M5459 Other low back pain: Secondary | ICD-10-CM | POA: Diagnosis not present

## 2023-07-24 DIAGNOSIS — M5432 Sciatica, left side: Secondary | ICD-10-CM

## 2023-07-24 DIAGNOSIS — M5431 Sciatica, right side: Secondary | ICD-10-CM | POA: Diagnosis not present

## 2023-07-24 NOTE — Therapy (Signed)
OUTPATIENT PHYSICAL THERAPY THORACOLUMBAR NOTE   Patient Name: Regina Cantu MRN: [id number] DOB:1980/11/04, 42 y.o., female Today's Date: 07/24/2023  END OF SESSION:  PT End of Session - 07/24/23 0943     Visit Number 16    Number of Visits 18    Date for PT Re-Evaluation 08/02/23    Authorization Type [place] MEDICAID [employer] CARITAS OF     PT Start Time 0940   pt late   PT Stop Time 1018    PT Time Calculation (min) 38 min    Activity Tolerance Patient tolerated treatment well    Behavior During Therapy WFL for tasks assessed/performed                  Past Medical History:  Diagnosis Date   Anxiety    Cancer (HCC)    ovarian 2001 and 2010   Depression    Essential hypertension 05/04/2022   Herniated disc, cervical 2011   Hypertension    Migraine    Pityriasis rosea    Uncontrolled type 2 diabetes mellitus with hyperglycemia (HCC) 05/04/2022   URI, acute 08/17/2022   Past Surgical History:  Procedure Laterality Date   ABDOMINAL HYSTERECTOMY     OVARIAN CANCER     Patient Active Problem List   Diagnosis Date Noted   Back pain 03/18/2023   Abnormal Pap smear of cervix 12/27/2022   Tobacco use 08/17/2022   Depression 08/17/2022   Healthcare maintenance 07/04/2022   Migraine 07/04/2022   History of CVA (cerebrovascular accident) 05/04/2022   History of ovarian cancer 05/04/2022   Uncontrolled type 2 diabetes mellitus with hyperglycemia (HCC) 05/04/2022   Essential hypertension 05/04/2022   Malignant (primary) neoplasm, unspecified (HCC) 05/24/2021   Other specified disorders of eustachian tube, bilateral 05/24/2021   Allergy, unspecified, initial encounter 05/24/2021    PCP: Chauncey Mann, DO  REFERRING PROVIDER: London Sheer, MD  REFERRING DIAG: M54.50 (ICD-10-CM) - Low back pain, unspecified back pain laterality, unspecified chronicity, unspecified whether sciatica presen   Rationale for Evaluation and Treatment:  Rehabilitation  THERAPY DIAG:  Other low back pain  Sciatica, right side  Sciatica, left side  ONSET DATE: 30 years ago  SUBJECTIVE:                                                                                                                                                                                           SUBJECTIVE STATEMENT: Back pain is 4/10. Knee pain is 7/10.  I think it was my day to out tape on .  Pt tired. Pinched Rt side (back)   PERTINENT HISTORY:  High  BMI, DM- uncontrolled, Stroke  PAIN:  Are you having pain? Yes: NPRS scale:4/10 Pain location: Low back to hips and bilateral knees Pain description: Throbbing, achy Aggravating factors: Prolonged standing, walking, sitting Relieving factors: Stretching, ocasional medications   07/02/23:  Shoulder pain 8/10 Popping, unstable    PRECAUTIONS: None  RED FLAGS: None   WEIGHT BEARING RESTRICTIONS: No  FALLS:  Has patient fallen in last 6 months?  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 To mange pain better.  NEXT MD VISIT: 10/7 Dr. Christell Constant  OBJECTIVE:   DIAGNOSTIC FINDINGS:  XRs of the lumbar spine from 04/01/2023 were independently reviewed and  interpreted, showing disc height loss at L4/5. No other significant  degenerative changes. No fracture or dislocation seen.  No evidence of  instability on flexion/extension views.  Lordotic alignment.   PATIENT SURVEYS:  FOTO: Perceived function 45%, predicted 52%   SCREENING FOR RED FLAGS: Bowel or bladder incontinence: No Spinal tumors: No Cauda equina syndrome: No Compression fracture: No  COGNITION: Overall cognitive status: Within functional limits for tasks  assessed     SENSATION: WFL  MUSCLE LENGTH: Hamstrings: Right WNLs deg; Left WNLs deg Maisie Fus test: Right Tight deg; Left Tight deg  POSTURE: rounded shoulders, forward head, and increased lumbar lordosis  PALPATION: Report of pressure to midline low back, paraspinal, PSIs, and gluteals  LUMBAR ROM:   AROM eval  Flexion Full, relief, use hands to walk up to return to standing  Extension Markedly limited, pain  Right lateral flexion Min limited, no pain  Left lateral flexion Min limited, pain R  Right rotation Full, no pain  Left rotation Full, pain R   (Blank rows = not tested)  LOWER EXTREMITY ROM:     WNLs Active  Right eval Left eval  Hip flexion    Hip extension    Hip abduction    Hip adduction    Hip internal rotation    Hip external rotation    Knee flexion    Knee extension    Ankle dorsiflexion    Ankle plantarflexion    Ankle inversion    Ankle eversion     (Blank rows = not tested)  LOWER EXTREMITY MMT:    Grossly 4+ to 5/5 and equal. Myotome screen neg  Weak core MMT Right eval Left eval  Hip flexion    Hip extension    Hip abduction    Hip adduction    Hip internal rotation    Hip external rotation    Knee flexion    Knee extension    Ankle dorsiflexion    Ankle plantarflexion    Ankle inversion    Ankle eversion     (Blank rows = not tested)  LUMBAR SPECIAL TESTS:  Straight leg raise test: Negative, Slump test: Negative, SI Compression/distraction test: Negative, and FABER test: Negative  FUNCTIONAL TESTS:  NT  GAIT: Distance walked: 200' Assistive device utilized: None Level of assistance: Complete Independence Comments: WNLs  TODAY'S TREATMENT:   [facility] Adult PT Treatment:  DATE: 07/24/23 Therapeutic Exercise: NuStep L6 UE and LE for 6 min  Ball squeeze with knee extension x 15  Ball bridge x 10  Bridge with march  Sidelying clam and hip abduction green band x 10 mod cues  for form  Leg press 1 plate x 15 double leg Single leg press 1 plate x 10, slow and increased effort  Manual Therapy: KT tape to L knee , patient performed with PT set up (no charge) < 3 min    [facility] Adult PT Treatment:                                                DATE: 07/17/23 Therapeutic Exercise: Supine stretching LTR x 10 Bridge wide x 10  Bridge with ball x 10 (for alignment)  Ball squeeze with knee extension x 10 each  SLR flexion and abduction x 10 each side (small ROM)  Bridge with march holding 10 lbs 90/90 core hold 30 sec (towel under low back)  Added 10 lbs x 30 sec  Added alt UE lift x 10  Alt knee extension x 10  Dead bug x 10 each side  Squat x 10 to mat table  Standing blue band shoulder extension hold with high march  Palloff press double blue anti-rot Added rotation away x 10       [facility] Adult PT Treatment:                                                DATE: 07/10/23 Therapeutic Exercise: Leg lengthener LLE x 10  Knee to chest  x 2  Sit to stand x 15  Step ups x  15 with opp knee lift (less hip pain than she got here) (6 inch)  Reverse step taps (6 inch)  Hip abd off step x 15  Sidelying L Ql stretch Manual Therapy: Bilateral patellar taping today using McConnell tape pulling patella medially. Showed patient how to do this on her own LAD LLE  Sidelying pressure to L QL during stretch position     [facility] Adult PT Treatment:                                                DATE: 07/02/23 Therapeutic Exercise: Supine Posterior Pelvic Tilt  x 10  Marching with PPT 2x5 Knee extension for quad warm up with towel under knee  SLR x 10 toes up , x 10 toe out  Slow marching on airex for core stability Tandem stance on Airex  Narrow stance EC Airex  EO head turns Airex  Manual therapy:  Bilateral patellar taping today using McConnell tape pulling patella medially.   [facility] Adult PT Treatment:                                                DATE:  06/14/23 Therapeutic Exercise: Supine Posterior Pelvic Tilt  x 10  Marching with PPT 2x5 SLR c PPT using  pilates circle  2x8 Supine Bridge c hip abd set c BluTB x10 Shoulder rows x15 GTB Shoulder ext x15 GTB Therapeutic Activity: FOTO completed and results reviewed   PATIENT EDUCATION:  Education details: hypermobility syndrome and stability  Person educated: Patient Education method: Explanation, Demonstration, Tactile cues, Verbal cues, and Handouts Education comprehension: verbalized understanding, returned demonstration, verbal cues required, tactile cues required, and needs further education  HOME EXERCISE PROGRAM:  Access Code: [email or web address] URL: [email or web address] Date: 05/22/2023 Prepared by: Karie Mainland  Exercises - Supine Bridge  - 2 x daily - 7 x weekly - 1 sets - 10-15 reps - 5 hold - Supine Posterior Pelvic Tilt  - 2 x daily - 7 x weekly - 1 sets - 10-15 reps - 3 hold - Hooklying Clamshell with Resistance  - 2 x daily - 7 x weekly - 1 sets - 10-15 reps - 3 hold - Sit to Stand Without Arm Support  - 1 x daily - 7 x weekly - 2 sets - 10 reps - Standing Shoulder Row with Anchored Resistance  - 1 x daily - 7 x weekly - 2 sets - 10 reps - 5 hold - Shoulder extension with resistance - Neutral  - 1 x daily - 7 x weekly - 2 sets - 10 reps - 5 hold - Beginner Clam  - 1 x daily - 7 x weekly - 2 sets - 10 reps - 5 hold - Sidelying Hip Abduction  - 1 x daily - 7 x weekly - 2 sets - 10 reps - 5 hold -QL   ASSESSMENT:  CLINICAL IMPRESSION:  Patient tolerated session for core and hip, quad strength without increased pain. She was able to tape her L knee with cue for the direction of pull.  She is looking into joining the pool or [employer] after PT finishes next week.     OBJECTIVE IMPAIRMENTS: difficulty walking, decreased ROM, decreased strength, obesity, and pain.   ACTIVITY LIMITATIONS: carrying, lifting, bending, sitting, standing, squatting, sleeping,  bathing, reach over head, hygiene/grooming, and locomotion level  PARTICIPATION LIMITATIONS: meal prep, cleaning, laundry, shopping, community activity, occupation, and yard work  PERSONAL FACTORS: Fitness, Past/current experiences, Time since onset of injury/illness/exacerbation, and 1 comorbidity: high BMI  are also affecting patient's functional outcome.   REHAB POTENTIAL: Good  CLINICAL DECISION MAKING: Evolving/moderate complexity  EVALUATION COMPLEXITY: Moderate   GOALS:  SHORT TERM GOALS: Target date: 05/17/23  Pt will be Ind in an initial HEP  Baseline: 10/3: min cues  Goal status: MET   2.  Pt will voice understanding of measures to assist in pain reduction Baseline: started Goal status: MET   LONG TERM GOALS: Target date: 08/02/23  Pt will be Ind in a final HEP to maintain achieved LOF  Baseline:  Goal status: 06/14/23: Ongoing  2.  Improve trunk extension ROM to a min limitation for improved function Baseline: markedly imited 06/05/23: Mod limitation Goal status: IMPROVED  3.  Improve back/core strength to where the pt can sustain a bridge for 60 sec and forward plank from toes for 30 sec Baseline:  06/05/23: Bridge 32 sec; plank from knees 5 sec 06/14/23: 30" Goal status: ONGOING for bridge. Deferred for plank  4.  Pt will report a 50% improvement in low back pain with daily and work related activities for improved function and QOL Baseline: 5-8/10 05/29/23: 30% improvement 06/14/23: 40% improvement Goal status: Improved , but not met  5.  Pt's FOTO score will improved to  the predicted value of 52% as indication of improved function  Baseline: 45%, 05/24/23: 44% 06/14/23: 44% Goal status: Ongoing  PLAN:  PT FREQUENCY: 2x/week  PT DURATION: 6 weeks  PLANNED INTERVENTIONS: Therapeutic exercises, Therapeutic activity, Neuromuscular re-education, Balance training, Gait training, Patient/Family education, Self Care, Joint mobilization, Aquatic Therapy,  Dry Needling, Electrical stimulation, Spinal mobilization, Cryotherapy, Moist heat, Taping, Traction, Ultrasound, Ionotophoresis 4mg /ml Dexamethasone, Manual therapy, and Re-evaluation.  PLAN FOR NEXT SESSION: Finish POC. DC, etc. Goals and full review of HEP for core.   Karie Mainland, PT 07/24/23 10:21 AM Phone: [phone] Fax: [phone]

## 2023-07-25 ENCOUNTER — Ambulatory Visit: Payer: Medicaid Other | Admitting: Physician Assistant

## 2023-07-25 DIAGNOSIS — Z23 Encounter for immunization: Secondary | ICD-10-CM | POA: Diagnosis not present

## 2023-07-25 NOTE — Progress Notes (Signed)
Hep B vaccine #3.

## 2023-07-28 NOTE — Therapy (Unsigned)
OUTPATIENT PHYSICAL THERAPY THORACOLUMBAR NOTE Discharge    PHYSICAL THERAPY DISCHARGE SUMMARY  Visits from Start of Care: 17  Current functional level related to goals / functional outcomes: See below    Remaining deficits: Core instability (min to mod)    Education / Equipment: HEP, lifting, core work, hypermobility    Patient agrees to discharge. Patient goals were partially met. Patient is being discharged due to maximized rehab potential   .   Patient Name: Regina Cantu MRN: [id number] DOB:11/09/1980, 42 y.o., female Today's Date: 07/29/2023  END OF SESSION:  PT End of Session - 07/29/23 0907     Visit Number 17    Number of Visits 18    Date for PT Re-Evaluation 08/02/23    Authorization Type [place] [employer] [place]    PT Start Time 0905   pt late   PT Stop Time 0930    PT Time Calculation (min) 25 min    Activity Tolerance Patient tolerated treatment well    Behavior During Therapy [facility] for tasks assessed/performed                   Past Medical History:  Diagnosis Date   Anxiety    Cancer (HCC)    ovarian 2001 and 2010   Depression    Essential hypertension 05/04/2022   Herniated disc, cervical 2011   Hypertension    Migraine    Pityriasis rosea    Uncontrolled type 2 diabetes mellitus with hyperglycemia (HCC) 05/04/2022   URI, acute 08/17/2022   Past Surgical History:  Procedure Laterality Date   ABDOMINAL HYSTERECTOMY     OVARIAN CANCER     Patient Active Problem List   Diagnosis Date Noted   Back pain 03/18/2023   Abnormal Pap smear of cervix 12/27/2022   Tobacco use 08/17/2022   Depression 08/17/2022   Healthcare maintenance 07/04/2022   Migraine 07/04/2022   History of CVA (cerebrovascular accident) 05/04/2022   History of ovarian cancer 05/04/2022   Uncontrolled type 2 diabetes mellitus with hyperglycemia (HCC) 05/04/2022   Essential hypertension 05/04/2022   Malignant (primary) neoplasm, unspecified  (HCC) 05/24/2021   Other specified disorders of eustachian tube, bilateral 05/24/2021   Allergy, unspecified, initial encounter 05/24/2021    PCP: Chauncey Mann, DO  REFERRING PROVIDER: London Sheer, MD  REFERRING DIAG: M54.50 (ICD-10-CM) - Low back pain, unspecified back pain laterality, unspecified chronicity, unspecified whether sciatica presen   Rationale for Evaluation and Treatment: Rehabilitation  THERAPY DIAG:  Other low back pain  Sciatica, left side  Sciatica, right side  ONSET DATE: 30 years ago  SUBJECTIVE:  SUBJECTIVE STATEMENT: Pt arrived late.  Stretched a little already.   PERTINENT HISTORY:  High BMI, DM- uncontrolled, Stroke  PAIN:  Are you having pain? Yes: NPRS scale:3/10 Pain location: Low back to hips and bilateral knees Pain description: Throbbing, achy Aggravating factors: Prolonged standing, walking, sitting Relieving factors: Stretching, ocasional medications   07/02/23:  Shoulder pain 8/10 Popping, unstable    PRECAUTIONS: None  RED FLAGS: None   WEIGHT BEARING RESTRICTIONS: No  FALLS:  Has patient fallen in last 6 months?  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 To mange pain better.  NEXT MD VISIT: 10/7 Dr. Christell Constant  OBJECTIVE:   DIAGNOSTIC FINDINGS:  XRs of the lumbar spine from 04/01/2023 were independently reviewed and  interpreted, showing disc height loss at L4/5. No other significant  degenerative changes. No fracture or dislocation seen.  No evidence of   instability on flexion/extension views.  Lordotic alignment.   PATIENT SURVEYS:  FOTO: Perceived function 45%, predicted 52%  07/29/23: 59%  SCREENING FOR RED FLAGS: Bowel or bladder incontinence: No Spinal tumors: No Cauda equina syndrome: No Compression fracture: No  COGNITION: Overall cognitive status: Within functional limits for tasks assessed     SENSATION: WFL  MUSCLE LENGTH: Hamstrings: Right WNLs deg; Left WNLs deg Maisie Fus test: Right Tight deg; Left Tight deg  POSTURE: rounded shoulders, forward head, and increased lumbar lordosis  PALPATION: Report of pressure to midline low back, paraspinal, PSIs, and gluteals  LUMBAR ROM:   AROM eval 07/29/23  Flexion Full, relief, use hands to walk up to return to standing Full, pain relief   Extension Markedly limited, pain Min limit, discomfort   Right lateral flexion Min limited, no pain WNL  Left lateral flexion Min limited, pain R WNL  Right rotation Full, no pain WNL  Left rotation Full, pain R WNL    (Blank rows = not tested)  LOWER EXTREMITY ROM:     WNLs Active  Right eval Left eval  Hip flexion    Hip extension    Hip abduction    Hip adduction    Hip internal rotation    Hip external rotation    Knee flexion    Knee extension    Ankle dorsiflexion    Ankle plantarflexion    Ankle inversion    Ankle eversion     (Blank rows = not tested)  LOWER EXTREMITY MMT:    Grossly 4+ to 5/5 and equal. Myotome screen neg  Weak core MMT Right eval Left eval  Hip flexion    Hip extension    Hip abduction    Hip adduction    Hip internal rotation    Hip external rotation    Knee flexion    Knee extension    Ankle dorsiflexion    Ankle plantarflexion    Ankle inversion    Ankle eversion     (Blank rows = not tested)  LUMBAR SPECIAL TESTS:  Straight leg raise test: Negative, Slump test: Negative, SI Compression/distraction test: Negative, and FABER test: Negative  FUNCTIONAL TESTS:   NT  GAIT: Distance walked: 200' Assistive device utilized: None Level of assistance: Complete Independence Comments: WNLs  TODAY'S TREATMENT:  [facility] Adult PT Treatment:  DATE: 07/30/23 Therapeutic Exercise: Wall squat- knee pain  Lateral step ups x 15 (8 inch step)  Lateral hip abd x 10  Step back off 8 inch step x 10 each  RDL x 15 lbs x 15  Bridge 60 sec  Therapeutic Activity: Goals, discharge and FOTO Need to always work on core   [facility] Adult PT Treatment:                                                DATE: 07/24/23 Therapeutic Exercise: NuStep L6 UE and LE for 6 min  Ball squeeze with knee extension x 15  Ball bridge x 10  Bridge with march  Sidelying clam and hip abduction green band x 10 mod cues for form  Leg press 1 plate x 15 double leg Single leg press 1 plate x 10, slow and increased effort  Manual Therapy: KT tape to L knee , patient performed with PT set up (no charge) < 3 min     [facility] Adult PT Treatment:                                                DATE: 07/17/23 Therapeutic Exercise: Supine stretching LTR x 10 Bridge wide x 10  Bridge with ball x 10 (for alignment)  Ball squeeze with knee extension x 10 each  SLR flexion and abduction x 10 each side (small ROM)  Bridge with march holding 10 lbs 90/90 core hold 30 sec (towel under low back)  Added 10 lbs x 30 sec  Added alt UE lift x 10  Alt knee extension x 10  Dead bug x 10 each side  Squat x 10 to mat table  Standing blue band shoulder extension hold with high march  Palloff press double blue anti-rot Added rotation away x 10       [facility] Adult PT Treatment:                                                DATE: 07/10/23 Therapeutic Exercise: Leg lengthener LLE x 10  Knee to chest  x 2  Sit to stand x 15  Step ups x  15 with opp knee lift (less hip pain than she got here) (6 inch)  Reverse step taps (6 inch)  Hip abd off step x 15  Sidelying L  Ql stretch Manual Therapy: Bilateral patellar taping today using McConnell tape pulling patella medially. Showed patient how to do this on her own LAD LLE  Sidelying pressure to L QL during stretch position     [facility] Adult PT Treatment:                                                DATE: 07/02/23 Therapeutic Exercise: Supine Posterior Pelvic Tilt  x 10  Marching with PPT 2x5 Knee extension for quad warm up with towel under knee  SLR x 10 toes up , x 10 toe  out  Slow marching on airex for core stability Tandem stance on Airex  Narrow stance EC Airex  EO head turns Airex  Manual therapy:  Bilateral patellar taping today using McConnell tape pulling patella medially.   [facility] Adult PT Treatment:                                                DATE: 06/14/23 Therapeutic Exercise: Supine Posterior Pelvic Tilt  x 10  Marching with PPT 2x5 SLR c PPT using pilates circle  2x8 Supine Bridge c hip abd set c BluTB x10 Shoulder rows x15 GTB Shoulder ext x15 GTB Therapeutic Activity: FOTO completed and results reviewed   PATIENT EDUCATION:  Education details: hypermobility syndrome and stability  Person educated: Patient Education method: Explanation, Demonstration, Tactile cues, Verbal cues, and Handouts Education comprehension: verbalized understanding, returned demonstration, verbal cues required, tactile cues required, and needs further education  HOME EXERCISE PROGRAM:  Access Code: [email or web address] URL: [email or web address] Date: 05/22/2023 Prepared by: Karie Mainland  Exercises - Supine Bridge  - 2 x daily - 7 x weekly - 1 sets - 10-15 reps - 5 hold - Supine Posterior Pelvic Tilt  - 2 x daily - 7 x weekly - 1 sets - 10-15 reps - 3 hold - Hooklying Clamshell with Resistance  - 2 x daily - 7 x weekly - 1 sets - 10-15 reps - 3 hold - Sit to Stand Without Arm Support  - 1 x daily - 7 x weekly - 2 sets - 10 reps - Standing Shoulder Row with Anchored Resistance  - 1 x daily -  7 x weekly - 2 sets - 10 reps - 5 hold - Shoulder extension with resistance - Neutral  - 1 x daily - 7 x weekly - 2 sets - 10 reps - 5 hold - Beginner Clam  - 1 x daily - 7 x weekly - 2 sets - 10 reps - 5 hold - Sidelying Hip Abduction  - 1 x daily - 7 x weekly - 2 sets - 10 reps - 5 hold -QL   ASSESSMENT:  CLINICAL IMPRESSION:  Patient has met or nearly met her goals for therapy.  She continues to have hypermobility within her spine but can manage her symptoms and understands her HEP.  She was educated on hinge patterns for lifting.  FOTO score surpassed her goal for DC.    OBJECTIVE IMPAIRMENTS: difficulty walking, decreased ROM, decreased strength, obesity, and pain.   ACTIVITY LIMITATIONS: carrying, lifting, bending, sitting, standing, squatting, sleeping, bathing, reach over head, hygiene/grooming, and locomotion level  PARTICIPATION LIMITATIONS: meal prep, cleaning, laundry, shopping, community activity, occupation, and yard work  PERSONAL FACTORS: Fitness, Past/current experiences, Time since onset of injury/illness/exacerbation, and 1 comorbidity: high BMI  are also affecting patient's functional outcome.   REHAB POTENTIAL: Good  CLINICAL DECISION MAKING: Evolving/moderate complexity  EVALUATION COMPLEXITY: Moderate   GOALS:  SHORT TERM GOALS: Target date: 05/17/23  Pt will be Ind in an initial HEP  Baseline: 10/3: min cues  Goal status: MET   2.  Pt will voice understanding of measures to assist in pain reduction Baseline: started Goal status: MET   LONG TERM GOALS: Target date: 08/02/23  Pt will be Ind in a final HEP to maintain achieved LOF  Baseline:  Goal status: 06/14/23:MET   2.  Improve trunk extension  ROM to a min limitation for improved function Baseline: markedly imited 06/05/23: Mod limitation 07/29/23: Min limitation  Goal status:MET   3.  Improve back/core strength to where the pt can sustain a bridge for 60 sec and forward plank from toes for  30 sec Baseline:  06/05/23: Bridge 32 sec; plank from knees 5 sec 06/14/23: 30" 07/29/23 Bridge 60 sec  Goal status: MET for bridge. Deferred for plank  4.  Pt will report a 50% improvement in low back pain with daily and work related activities for improved function and QOL Baseline: 5-8/10 05/29/23: 30% improvement 06/14/23: 40% improvement Goal status: Improved , but not met  5.  Pt's FOTO score will improved to the predicted value of 52% as indication of improved function  Baseline: 45%, 05/24/23: 44% 06/14/23: 44% 07/29/23: 59% Goal status: MET   PLAN:  PT FREQUENCY: 2x/week  PT DURATION: 6 weeks  PLANNED INTERVENTIONS: Therapeutic exercises, Therapeutic activity, Neuromuscular re-education, Balance training, Gait training, Patient/Family education, Self Care, Joint mobilization, Aquatic Therapy, Dry Needling, Electrical stimulation, Spinal mobilization, Cryotherapy, Moist heat, Taping, Traction, Ultrasound, Ionotophoresis 4mg /ml Dexamethasone, Manual therapy, and Re-evaluation.  PLAN FOR NEXT SESSION: DC  Karie Mainland, PT 07/29/23 10:29 AM Phone: [phone] Fax: [phone]

## 2023-07-29 ENCOUNTER — Ambulatory Visit: Payer: Medicaid Other | Admitting: Physical Therapy

## 2023-07-29 ENCOUNTER — Encounter: Payer: Self-pay | Admitting: Physical Therapy

## 2023-07-29 DIAGNOSIS — M5459 Other low back pain: Secondary | ICD-10-CM | POA: Diagnosis not present

## 2023-07-29 DIAGNOSIS — M5431 Sciatica, right side: Secondary | ICD-10-CM | POA: Diagnosis not present

## 2023-07-29 DIAGNOSIS — M5432 Sciatica, left side: Secondary | ICD-10-CM | POA: Diagnosis not present

## 2023-08-01 ENCOUNTER — Other Ambulatory Visit (INDEPENDENT_AMBULATORY_CARE_PROVIDER_SITE_OTHER): Payer: Self-pay

## 2023-08-01 ENCOUNTER — Other Ambulatory Visit (HOSPITAL_COMMUNITY): Payer: Self-pay

## 2023-08-01 ENCOUNTER — Ambulatory Visit: Payer: Medicaid Other | Admitting: Orthopedic Surgery

## 2023-08-01 DIAGNOSIS — M25561 Pain in right knee: Secondary | ICD-10-CM

## 2023-08-01 DIAGNOSIS — M25512 Pain in left shoulder: Secondary | ICD-10-CM

## 2023-08-01 MED ORDER — LIDOCAINE 5 % EX PTCH
1.0000 | MEDICATED_PATCH | CUTANEOUS | 0 refills | Status: DC
  Filled 2023-08-01 – 2023-08-05 (×2): qty 30, 30d supply, fill #0

## 2023-08-01 MED ORDER — METHOCARBAMOL 500 MG PO TABS
500.0000 mg | ORAL_TABLET | Freq: Three times a day (TID) | ORAL | 0 refills | Status: DC | PRN
  Filled 2023-08-01: qty 60, 20d supply, fill #0

## 2023-08-01 NOTE — Progress Notes (Signed)
Orthopedic Spine Surgery Office Note   Assessment: Patient is a 42 y.o. female with chronic low back pain. Has some physical exam findings point to SI joints as possible etiology. She also is reporting left shoulder pain which seems consistent with rotator cuff pathology. She has right knee pain as well, possible meniscus tear     Plan: -Patient has tried chiropractor, ibuprofen, PT -She should continue with her home exercise program for her low back pain. Also, prescribe lidocaine patches -For her shoulder pain, I told her to take tylenol. I also prescribed robaxin -If she is not doing any better at our next visit, will order an MRI of her shoulder to evaluate for rotator cuff tear -She can use the tylenol for her knee pain as well -If her LBP were to get worse, could consider diagnostic/therapeutic SI joint injections -Would need to be nicotine free prior to any elective spine surgery -Patient should return to office in 6 weeks, x-rays at next visit: none     Patient expressed understanding of the plan and all questions were answered to the patient's satisfaction.    ___________________________________________________________________________     History:   Patient is a 42 y.o. female who presents today for several issues. She is still having low back pain. It is still better since doing therapy but she still notices back pain. She does not have pain that radiates into either lower extremity. She also is reporting right knee pain and popping. Pain is mild in nature but she is concerned about the popping. This has been going on for several months. There was no trauma or injury that preceded the onset of pain. She has been able to ambulate without any assistive devices. She has not noticed any catching. She also is reporting left shoulder pain which is more significant. She has had this pain for a year and it has been getting progressively worse. She feels it deep within the shoulder joint. She  said it feels like it coming out of place at times. She notices pain particularly with overhead activity but she even has pain in the shoulder with something as simple as turning the steering wheel. She does not have pain at rest.    Treatments tried: chiropractor, ibuprofen, PT     Physical Exam:   General: no acute distress, appears stated age Neurologic: alert, answering questions appropriately, following commands Respiratory: unlabored breathing on room air, symmetric chest rise Psychiatric: appropriate affect, normal cadence to speech     MSK (spine):   -Strength exam                                                   Left                  Right EHL                              5/5                  5/5 TA                                 5/5  5/5 GSC                             5/5                  5/5 Knee extension            5/5                  5/5 Hip flexion                    5/5                  5/5   -Sensory exam                           Sensation intact to light touch in L3-S1 nerve distributions of bilateral lower extremities   -Straight leg raise: negative bilaterally -Clonus: no beats bilaterally   -Left shoulder exam: weakness and pain with Jobe, negative drop arm sign, negative belly press, no weakness with external rotation with arm at side -Right knee exam: range of motion from 0-120, negative lachman, negative posterior drawer knee stable to varus and valgus stress, no effusion palpated   Imaging: XRs of the lumbar spine from 04/01/2023 were previously independently reviewed and interpreted, showing disc height loss at L4/5. No other significant degenerative changes. No fracture or dislocation seen.  No evidence of instability on flexion/extension views.  Lordotic alignment.  XRs of the left shoulder from 08/01/2023 were independently reviewed and interpreted, showing no fracture or dislocation. No significant degenerative changes.     XRs  of the right knee from 08/01/2023 were independently reviewed and interpreted, showing enthesophytes at the patella. No significant joint space narrowing, subchondral sclerosis, or osteophytes seen within the 3 compartments. No fracture or dislocation seen.     Patient name: Regina Cantu Patient MRN: [id number] Date of visit: 08/01/23

## 2023-08-05 ENCOUNTER — Other Ambulatory Visit (HOSPITAL_COMMUNITY): Payer: Self-pay

## 2023-08-05 ENCOUNTER — Telehealth: Payer: Self-pay | Admitting: Physician Assistant

## 2023-08-05 NOTE — Telephone Encounter (Signed)
The patient called in because her PCP wants her to schedule her colonoscopy.

## 2023-08-06 ENCOUNTER — Telehealth: Payer: Self-pay

## 2023-08-06 ENCOUNTER — Other Ambulatory Visit (HOSPITAL_COMMUNITY): Payer: Self-pay

## 2023-08-06 ENCOUNTER — Other Ambulatory Visit: Payer: Self-pay

## 2023-08-06 DIAGNOSIS — Z1211 Encounter for screening for malignant neoplasm of colon: Secondary | ICD-10-CM

## 2023-08-06 MED ORDER — PEG 3350-KCL-NA BICARB-NACL 420 G PO SOLR
8000.0000 mL | Freq: Once | ORAL | 0 refills | Status: AC
  Filled 2023-08-06: qty 4000, 2d supply, fill #0
  Filled 2023-08-06: qty 4000, fill #0
  Filled 2023-08-08: qty 8000, 2d supply, fill #0

## 2023-08-06 NOTE — Telephone Encounter (Signed)
Spoke with patient -colonoscopy scheduled for 10/04/23 with Tobi Bastos and 2 day prep with [facility]. Instructions mailed to patient.

## 2023-08-08 ENCOUNTER — Other Ambulatory Visit (HOSPITAL_COMMUNITY): Payer: Self-pay

## 2023-08-09 ENCOUNTER — Telehealth: Payer: Self-pay

## 2023-08-09 ENCOUNTER — Ambulatory Visit: Payer: Medicaid Other | Admitting: Student

## 2023-08-09 ENCOUNTER — Emergency Department (HOSPITAL_BASED_OUTPATIENT_CLINIC_OR_DEPARTMENT_OTHER)
Admission: EM | Admit: 2023-08-09 | Discharge: 2023-08-09 | Disposition: A | Discharge status: Home / Self Care | Payer: Medicaid Other | Attending: Emergency Medicine | Admitting: Emergency Medicine

## 2023-08-09 ENCOUNTER — Other Ambulatory Visit: Payer: Self-pay

## 2023-08-09 ENCOUNTER — Encounter (HOSPITAL_BASED_OUTPATIENT_CLINIC_OR_DEPARTMENT_OTHER): Payer: Self-pay | Admitting: Emergency Medicine

## 2023-08-09 DIAGNOSIS — E1165 Type 2 diabetes mellitus with hyperglycemia: Secondary | ICD-10-CM

## 2023-08-09 DIAGNOSIS — Z91040 Latex allergy status: Secondary | ICD-10-CM | POA: Insufficient documentation

## 2023-08-09 DIAGNOSIS — R55 Syncope and collapse: Secondary | ICD-10-CM | POA: Insufficient documentation

## 2023-08-09 DIAGNOSIS — E119 Type 2 diabetes mellitus without complications: Secondary | ICD-10-CM | POA: Diagnosis not present

## 2023-08-09 DIAGNOSIS — Z20822 Contact with and (suspected) exposure to covid-19: Secondary | ICD-10-CM | POA: Insufficient documentation

## 2023-08-09 DIAGNOSIS — R159 Full incontinence of feces: Secondary | ICD-10-CM | POA: Diagnosis not present

## 2023-08-09 DIAGNOSIS — I10 Essential (primary) hypertension: Secondary | ICD-10-CM | POA: Diagnosis not present

## 2023-08-09 DIAGNOSIS — Z7982 Long term (current) use of aspirin: Secondary | ICD-10-CM | POA: Insufficient documentation

## 2023-08-09 DIAGNOSIS — R4020 Unspecified coma: Secondary | ICD-10-CM

## 2023-08-09 LAB — CBC
HCT: 46.7 % — ABNORMAL HIGH (ref 36.0–46.0)
Hemoglobin: 16 g/dL — ABNORMAL HIGH (ref 12.0–15.0)
MCH: 29.9 pg (ref 26.0–34.0)
MCHC: 34.3 g/dL (ref 30.0–36.0)
MCV: 87.3 fL (ref 80.0–100.0)
Platelets: 397 10*3/uL (ref 150–400)
RBC: 5.35 MIL/uL — ABNORMAL HIGH (ref 3.87–5.11)
RDW: 12.5 % (ref 11.5–15.5)
WBC: 9.8 10*3/uL (ref 4.0–10.5)
nRBC: 0 % (ref 0.0–0.2)

## 2023-08-09 LAB — BASIC METABOLIC PANEL
Anion gap: 9 (ref 5–15)
BUN: 15 mg/dL (ref 6–20)
CO2: 27 mmol/L (ref 22–32)
Calcium: 9.2 mg/dL (ref 8.9–10.3)
Chloride: 101 mmol/L (ref 98–111)
Creatinine, Ser: 0.76 mg/dL (ref 0.44–1.00)
GFR, Estimated: >60 mL/min (ref >60)
Glucose, Bld: 94 mg/dL (ref 70–99)
Potassium: 3.6 mmol/L (ref 3.5–5.1)
Sodium: 137 mmol/L (ref 135–145)

## 2023-08-09 LAB — RESP PANEL BY RT-PCR (RSV, FLU A&B, COVID)  RVPGX2
Influenza A by PCR: NEGATIVE
Influenza B by PCR: NEGATIVE
Resp Syncytial Virus by PCR: NEGATIVE
SARS Coronavirus 2 by RT PCR: NEGATIVE

## 2023-08-09 LAB — CBG MONITORING, ED: Glucose-Capillary: 114 mg/dL — ABNORMAL HIGH (ref 70–99)

## 2023-08-09 MED ORDER — ONDANSETRON HCL 4 MG/2ML IJ SOLN
4.0000 mg | Freq: Once | INTRAMUSCULAR | Status: AC
  Administered 2023-08-09: 4 mg via INTRAVENOUS
  Filled 2023-08-09: qty 2

## 2023-08-09 MED ORDER — ONDANSETRON HCL 4 MG PO TABS: 4.0000 mg | ORAL_TABLET | Freq: Three times a day (TID) | ORAL | 0 refills | Status: DC | PRN

## 2023-08-09 NOTE — Discharge Instructions (Addendum)
 Please make sure that you are eating at least 3 small meals regularly throughout the day.  Not eating meals can lead to low blood sugars, which can make you dizzy and lightheaded.

## 2023-08-09 NOTE — ED Notes (Signed)
 Patient given po fluids and graham crackers. Patient eating and drinking well at this time. Denies n/v

## 2023-08-09 NOTE — Assessment & Plan Note (Signed)
 Reports episode of hypoglycemia to 56 with lightheadedness. Currently taking Farxiga  and Trulicity . Has glucometer. No recent med changes. Not taking insulin  or glipizide . SGLT2 and GLP-1 should not cause hypoglycemia. Unclear of cause. Limited appetite and intake during work days but eats 3 meals on days off. Due to episodes of LOC c/f syncope for past week of unclear etiology, recommend patient to seek emergent care today for further evaluation.

## 2023-08-09 NOTE — Telephone Encounter (Signed)
 Spoke with patient has been having bouts of stool incontinence for 2 weeks. Glucose levels has been dropping.below 70. Sugars go up to above 400 after eating.  She eats enough to keep her sugars going.  Sugars drop when she is at work. Has had some dizziness as well.  Needs note as she has been out of work for a few days. Patient given a TeleHealth appointment for this afternoon.

## 2023-08-09 NOTE — Assessment & Plan Note (Signed)
 Currently on losartan-hydrochlorothiazide and amlodipine. Endorses dizziness and lightheadedness along with LOC c/f syncope. Has BP cuff at home but does not use. Advised to hold antihypertensives pending ED/UC evaluation today.

## 2023-08-09 NOTE — ED Triage Notes (Signed)
 Pt POV ambulatory to triage reporting multiple syncopal episodes at work and at home in the past week, called telehealth and told to come ED for evaluation. Axo x4, diabetic, cbg 114 in triage

## 2023-08-09 NOTE — ED Provider Notes (Signed)
 [facility] AT [facility] Provider Note   CSN: [id number] Arrival date & time: 08/09/23  1732     History  Chief Complaint  Patient presents with   Loss of Consciousness    Regina Cantu is a 43 y.o. female with a history of diabetes presenting to the ED with complaint of dizzy spells.  The patient reports over the past 5 days she has felt unwell, with some nausea and upset stomach.  She has had at work several times she became lightheaded and felt that she was going to pass out but denies to me complete loss of consciousness.  She says she has been skipping meals because she has no appetite, although she is diabetic and diabetic medications.  He says in the past she has gotten dizzy when her blood sugar drops low.  I reviewed her external records.  She was seen by cardiology and had an echocardiogram in September 2023 which showed a normal EF, grade 1 diastolic dysfunction, no significant valvular disease.  She also had a Holter monitor in 2023 which showed normal sinus rhythm with average heart rate 93 bpm, no arrhythmias.  HPI     Home Medications Prior to Admission medications   Medication Sig Start Date End Date Taking? Authorizing Provider  ondansetron  (ZOFRAN ) 4 MG tablet Take 1 tablet (4 mg total) by mouth every 8 (eight) hours as needed for up to 15 doses for nausea or vomiting. 08/09/23  Yes Cataleia Gade, Donnice PARAS, MD  amLODipine  (NORVASC ) 5 MG tablet Take 1 tablet (5 mg total) by mouth daily. 04/15/23   Gregary Sharper, MD  aspirin  EC 81 MG tablet Take 81 mg by mouth daily. Swallow whole.    [provider]  atorvastatin  (LIPITOR) 80 MG tablet Take 1 tablet (80 mg total) by mouth daily. 04/15/23   Gregary Sharper, MD  Continuous Glucose Receiver (DEXCOM G7 RECEIVER) DEVI Use as directed 03/21/23   Kandis Perkins, DO  Continuous Glucose Sensor (DEXCOM G7 SENSOR) MISC Place new sensor every 10 days. Use to monitor your blood sugar continuously.  03/21/23   Kandis Perkins, DO  Dulaglutide  (TRULICITY ) 0.75 MG/0.5ML SOAJ Inject 0.75 mg into the skin once a week. 07/22/23   Atway, Rayann N, DO  EPINEPHrine  0.3 mg/0.3 mL IJ SOAJ injection Inject 0.3 mg into the muscle as needed for anaphylaxis. 03/20/23   Kandis Perkins, DO  FARXIGA  5 MG TABS tablet Take 1 tablet (5 mg total) by mouth daily before breakfast. 04/15/23   Gregary Sharper, MD  lidocaine  (LIDODERM ) 5 % Place 1 patch onto the skin daily. Remove & Discard patch within 12 hours or as directed by MD 08/01/23   Georgina Sharper LABOR, MD  linaclotide  (LINZESS ) 145 MCG CAPS capsule Take 1 capsule (145 mcg total) by mouth daily before breakfast. 04/15/23   Gregary Sharper, MD  losartan -hydrochlorothiazide  (HYZAAR ) 50-12.5 MG tablet Take 1 tablet by mouth daily. 04/15/23   Gregary Sharper, MD  methocarbamol  (ROBAXIN ) 500 MG tablet Take 1 tablet (500 mg total) by mouth every 8 (eight) hours as needed (muscle spasms, pain). 08/01/23   Georgina Sharper LABOR, MD  omeprazole  (PRILOSEC) 20 MG capsule Take 1 capsule (20 mg total) by mouth daily. 04/15/23   Justus, Michael, MD  polyethylene glycol-electrolytes (NULYTELY) 420 g solution Take 4,000mls by mouth on day 1, then 4,000ml by mouth on day 2.  **Use as directed for your colonoscopy preparation instructions from md 08/06/23 08/10/23  [place], PA-C  Rimegepant Sulfate  (NURTEC)  75 MG TBDP Take 1/2 tablet (37.5 mg total) by mouth daily as needed 10/18/22   Atway, Rayann N, DO  topiramate  (TOPAMAX ) 25 MG tablet Take 1 tablet (25 mg total) by mouth at bedtime. 04/15/23   Gregary Sharper, MD  venlafaxine  XR (EFFEXOR -XR) 150 MG 24 hr capsule Take 1 capsule (150 mg total) by mouth daily with breakfast. 06/25/23   Atway, Rayann N, DO      Allergies    Bee venom, Mushroom extract complex (do not select), Wasp venom, Latex, and Penicillins    Review of Systems   Review of Systems  Physical Exam Updated Vital Signs BP 115/68   Pulse 85   Temp 97.9 F (36.6 C)   Resp  18   Ht 5' 2 (1.575 m)   SpO2 98%   BMI 35.45 kg/m  Physical Exam Constitutional:      General: She is not in acute distress. HENT:     Head: Normocephalic and atraumatic.  Eyes:     Conjunctiva/sclera: Conjunctivae normal.     Pupils: Pupils are equal, round, and reactive to light.  Cardiovascular:     Rate and Rhythm: Normal rate and regular rhythm.  Pulmonary:     Effort: Pulmonary effort is normal. No respiratory distress.  Abdominal:     General: There is no distension.     Tenderness: There is no abdominal tenderness.  Skin:    General: Skin is warm and dry.  Neurological:     General: No focal deficit present.     Mental Status: She is alert. Mental status is at baseline.  Psychiatric:        Mood and Affect: Mood normal.        Behavior: Behavior normal.     ED Results / Procedures / Treatments   Labs (all labs ordered are listed, but only abnormal results are displayed) Labs Reviewed  CBC - Abnormal; Notable for the following components:      Result Value   RBC 5.35 (*)    Hemoglobin 16.0 (*)    HCT 46.7 (*)    All other components within normal limits  CBG MONITORING, ED - Abnormal; Notable for the following components:   Glucose-Capillary 114 (*)    All other components within normal limits  RESP PANEL BY RT-PCR (RSV, FLU A&B, COVID)  [id number]  BASIC METABOLIC PANEL  CBG MONITORING, ED    EKG None  Radiology No results found.  Procedures Procedures    Medications Ordered in ED Medications  ondansetron  (ZOFRAN ) injection 4 mg (has no administration in time range)    ED Course/ Medical Decision Making/ A&P                                 Medical Decision Making Amount and/or Complexity of Data Reviewed Labs: ordered.  Risk Prescription drug management.   This is a well-appearing patient presenting to ED with complaint of near syncope.  Differential include hypoglycemia versus arrhythmia versus anemia versus viral URI versus  other.  Supplemental history is provided by the patient's husband at the bedside.  I reviewed her external records including cardiology outpatient workup, echocardiogram as noted above.  Patient was maintained on telemetry which I reviewed showing a sinus rhythm.  Her EKG per my interpretation shows normal sinus rhythm with no acute ischemic findings.  I personally reviewed and interpreted the patient's labs here, with no acute anemia, no leukocytosis,  BMP unremarkable.  We will add on a flu and COVID test.  Zofran  was given for nausea.  I suspect she likely has a viral illness this week that is reducing her appetite, she has not been eating regularly, which may be causing some hypoglycemic issues.  She is not hypoglycemic here but I did explain to her and her husband the importance of eating at least 3 regular small meals a day, and we will give her some food here in the ED prior to discharge.  With her cardiac workup performed in the past have a low suspicion for acute coronary or cardiac cause of her lightheadedness.  She does not appear clinically dehydrated.  At this time there is no indication for emergent hospitalization.  Low suspicion for sepsis, acute PE.        Final Clinical Impression(s) / ED Diagnoses Final diagnoses:  Near syncope    Rx / DC Orders ED Discharge Orders          Ordered    ondansetron  (ZOFRAN ) 4 MG tablet  Every 8 hours PRN        08/09/23 2101              Cottie Donnice PARAS, MD 08/09/23 2104

## 2023-08-09 NOTE — ED Notes (Signed)
Pt is aware that a urine sample is needed.  

## 2023-08-09 NOTE — Progress Notes (Addendum)
   CC: hypoglycemia, LOC c/f syncope, stool incontinence  This is a telephone encounter between [employer] and Regina Cantu on 08/09/2023 for hypoglycemia, LOC c/f syncope and stool incontinence. The visit was conducted with the patient located at home and Regina Cantu at [facility]. The patient's identity was confirmed using their DOB and current address. The patient has consented to being evaluated through a telephone encounter and understands the associated risks (an examination cannot be done and the patient may need to come in for an appointment) / benefits (allows the patient to remain at home, decreasing exposure to coronavirus). I personally spent 18 minutes on medical discussion.   HPI:  [name] is a 43 y.o. with PMH as below.   Please see A&P for assessment of the patient's acute and chronic medical conditions.   Past Medical History:  Diagnosis Date   Anxiety    Cancer (HCC)    ovarian 2001 and 2010   Depression    Essential hypertension 05/04/2022   Herniated disc, cervical 2011   Hypertension    Migraine    Pityriasis rosea    Uncontrolled type 2 diabetes mellitus with hyperglycemia (HCC) 05/04/2022   URI, acute 08/17/2022   Review of Systems:   Positive: dizziness, lightheadedness, LOC, loose stools  Negative: recent illness, abdominal pain, n/v, fall, acute back pain, rectal pain, numbness    Assessment & Plan:   Uncontrolled type 2 diabetes mellitus with hyperglycemia (HCC) Reports episode of hypoglycemia to 54 with lightheadedness. Currently taking Farxiga  and Trulicity . Has glucometer. No recent med changes. Not taking insulin  or glipizide . SGLT2 and GLP-1 should not cause hypoglycemia. Unclear of cause. Limited appetite and intake during work days but eats 3 meals on days off. Due to episodes of LOC c/f syncope for past week of unclear etiology, recommend patient to seek emergent care today for further evaluation.   LOC (loss of consciousness)  (HCC) Reports at least 4 episodes of LOC for past week. No specific time or trigger. One episode she reports sitting with phone in hand and next thing she is leaned over and phone not in hand. States about 10-20 mins of LOC, denies symptoms after regaining consciousness. No falls. No changes in sleep. No prior history of this. Limited appetite on work days and 3 meals on days off. No recent illness or sick contacts. Does endorse lightheadedness and/or dizziness which is acute as well. Concerned due to random occurrences and patient is driving.   Patient needs emergent evaluation for unclear cause of possible syncope. Hold home antihypertensives. Advised patient to seek emergent care today since it is weekend which patient verbalizes understanding of plan. Patient to call her friend after telehealth visit.   Essential hypertension Currently on losartan -hydrochlorothiazide  and amlodipine . Endorses dizziness and lightheadedness along with LOC c/f syncope. Has BP cuff at home but does not use. Advised to hold antihypertensives pending ED/UC evaluation today.   Incontinence of feces Reports stool incontinence for past 1-2 weeks. Loose stools not watery about 1-2 BM which is her normal. Takes Linzess  daily. No recent sick contacts with diarrhea. States she spoke with GI office and has colonoscopy scheduled in February. No recent med changes, no acute back pain or rectal pain. No saddle paresthesia. Has hx of urinary incontinence in past. Plans to f/u with GI.     Patient discussed with Dr. Karna Ozell Nearing, DO Internal Medicine Resident 08/09/23

## 2023-08-09 NOTE — Assessment & Plan Note (Signed)
 Reports stool incontinence for past 1-2 weeks. Loose stools not watery about 1-2 BM which is her normal. Takes Linzess  daily. No recent sick contacts with diarrhea. States she spoke with GI office and has colonoscopy scheduled in February. No recent med changes, no acute back pain or rectal pain. No saddle paresthesia. Has hx of urinary incontinence in past. Plans to f/u with GI.

## 2023-08-09 NOTE — Telephone Encounter (Signed)
 Requesting a letter for work. States she has not been to work since 08/02/2023. Pt states blood sugar has dropped below 70, and feeling dizzy.

## 2023-08-09 NOTE — Assessment & Plan Note (Addendum)
 Reports at least 4 episodes of LOC for past week. No specific time or trigger. One episode she reports sitting with phone in hand and next thing she is leaned over and phone not in hand. States about 10-20 mins of LOC, denies symptoms after regaining consciousness. No falls. No changes in sleep. No prior history of this. Limited appetite on work days and 3 meals on days off. No recent illness or sick contacts. Does endorse lightheadedness and/or dizziness which is acute as well. Concerned due to random occurrences and patient is driving.   Patient needs emergent evaluation for unclear cause of possible syncope. Hold home antihypertensives. Advised patient to seek emergent care today since it is weekend which patient verbalizes understanding of plan. Patient to call her friend after telehealth visit.

## 2023-08-09 NOTE — ED Notes (Signed)
 ED Provider at bedside.

## 2023-08-19 NOTE — Progress Notes (Signed)
Internal Medicine Clinic Attending  Case discussed with Dr. Zheng  At the time of the visit.  We reviewed the resident's history and exam and pertinent patient test results.  I agree with the assessment, diagnosis, and plan of care documented in the resident's note.  

## 2023-08-20 ENCOUNTER — Telehealth: Payer: Self-pay

## 2023-08-20 NOTE — Telephone Encounter (Signed)
 Pt called stating she had some fecal incontinence today. Pt is very upset and is scared to go to work today. She is having abdominal pain. I advised her you can speak with Regina Cantu and see if she recommends her move her colonoscopy to a sooner date.       Spoke with patient and she requested to have Colonoscopy sooner -she is scheduled 09/10/23 with Dr.wohl. she had some incontinence today and prefers to have it done as soon as she can- spoke with [facility] and requested patient move from 2/28 with Dr.Anna to 09/10/23 with Dr.Wohl.

## 2023-08-20 NOTE — Telephone Encounter (Signed)
 Pt called stating she had some fecal incontinence today. Pt is very upset and is scared to go to work today. She is having abdominal pain. I advised her you can speak with Regina Cantu and see if she recommends her move her colonoscopy to a sooner date.

## 2023-08-26 ENCOUNTER — Telehealth: Payer: Self-pay | Admitting: *Deleted

## 2023-08-26 NOTE — Telephone Encounter (Signed)
Patient left a voicemail because she was concern about her colonoscopy.  Suggested to patient that I will update her colonoscopy instructions with the 09/10/2023 date.  I will make it available on MyChart and mail her out a copy.

## 2023-08-28 ENCOUNTER — Other Ambulatory Visit: Payer: Self-pay | Admitting: Internal Medicine

## 2023-08-28 ENCOUNTER — Other Ambulatory Visit: Payer: Self-pay

## 2023-08-28 ENCOUNTER — Other Ambulatory Visit (HOSPITAL_COMMUNITY): Payer: Self-pay

## 2023-08-28 ENCOUNTER — Other Ambulatory Visit: Payer: Self-pay | Admitting: Student

## 2023-08-28 DIAGNOSIS — F330 Major depressive disorder, recurrent, mild: Secondary | ICD-10-CM

## 2023-08-28 DIAGNOSIS — G43719 Chronic migraine without aura, intractable, without status migrainosus: Secondary | ICD-10-CM

## 2023-08-28 MED ORDER — TOPIRAMATE 25 MG PO TABS
25.0000 mg | ORAL_TABLET | Freq: Every day | ORAL | 3 refills | Status: DC
  Filled 2023-08-28 – 2023-09-03 (×2): qty 30, 30d supply, fill #0

## 2023-08-28 MED ORDER — VENLAFAXINE HCL ER 150 MG PO CP24
150.0000 mg | ORAL_CAPSULE | Freq: Every day | ORAL | 3 refills | Status: DC
  Filled 2023-08-28: qty 30, 30d supply, fill #0
  Filled 2023-10-02 – 2023-10-11 (×2): qty 30, 30d supply, fill #1
  Filled 2023-10-30: qty 30, 30d supply, fill #2

## 2023-08-28 NOTE — Telephone Encounter (Signed)
Medication sent to pharmacy  

## 2023-08-29 ENCOUNTER — Ambulatory Visit: Payer: Medicaid Other | Admitting: Orthopedic Surgery

## 2023-08-29 ENCOUNTER — Encounter: Payer: Self-pay | Admitting: Orthopedic Surgery

## 2023-08-29 ENCOUNTER — Telehealth: Payer: Self-pay | Admitting: Orthopedic Surgery

## 2023-08-29 DIAGNOSIS — M75112 Incomplete rotator cuff tear or rupture of left shoulder, not specified as traumatic: Secondary | ICD-10-CM

## 2023-08-29 NOTE — Telephone Encounter (Signed)
I called and advised her that the order was placed and she states that they have called her and she goes on Wednesday. And she follows up with on 09/16/23

## 2023-08-29 NOTE — Telephone Encounter (Signed)
Pt states Dr Christell Constant was to send referral for MRI and. No referral in chart. Please call pt at [phone].

## 2023-09-03 ENCOUNTER — Telehealth: Payer: Self-pay

## 2023-09-03 ENCOUNTER — Other Ambulatory Visit: Payer: Self-pay

## 2023-09-03 ENCOUNTER — Other Ambulatory Visit (HOSPITAL_COMMUNITY): Payer: Self-pay

## 2023-09-03 NOTE — Telephone Encounter (Signed)
Prior Authorization for patient (Dexcom G7 Sensor) came through on cover my meds was submitted with last office notes and labs awaiting approval or denial.  [id number]

## 2023-09-04 ENCOUNTER — Telehealth: Payer: Self-pay

## 2023-09-04 ENCOUNTER — Other Ambulatory Visit (HOSPITAL_COMMUNITY): Payer: Self-pay

## 2023-09-04 ENCOUNTER — Ambulatory Visit
Admission: RE | Admit: 2023-09-04 | Discharge: 2023-09-04 | Disposition: A | Discharge status: Home / Self Care | Payer: Medicaid Other | Source: Ambulatory Visit | Attending: Orthopedic Surgery | Admitting: Orthopedic Surgery

## 2023-09-04 DIAGNOSIS — M75112 Incomplete rotator cuff tear or rupture of left shoulder, not specified as traumatic: Secondary | ICD-10-CM

## 2023-09-04 DIAGNOSIS — M19012 Primary osteoarthritis, left shoulder: Secondary | ICD-10-CM | POA: Diagnosis not present

## 2023-09-04 DIAGNOSIS — M25512 Pain in left shoulder: Secondary | ICD-10-CM | POA: Diagnosis not present

## 2023-09-04 DIAGNOSIS — G8929 Other chronic pain: Secondary | ICD-10-CM | POA: Diagnosis not present

## 2023-09-04 MED ORDER — FREESTYLE LIBRE 3 SENSOR MISC
1.0000 | 2 refills | Status: DC
  Filled 2023-09-04 – 2023-09-08 (×2): qty 2, 28d supply, fill #0

## 2023-09-04 NOTE — Addendum Note (Signed)
Addended by: Modena Slater on: 09/04/2023 09:30 AM   Modules accepted: Orders

## 2023-09-04 NOTE — Telephone Encounter (Signed)
Decision:Denied  Regina Cantu (Key: Peggyann Juba) PA Case ID #: [id number] Rx #: [id number] Need Help? Call us at [phone] Outcome Denied on January 28 by [employer] 2017 Denied Drug Dexcom G7 Sensor ePA cloud logo Form [employer] Electronic Prior Authorization Form Original Claim Info 75

## 2023-09-04 NOTE — Telephone Encounter (Signed)
Prior Authorization for patient (FreeStyle Libre 3 Sensor) came through on cover my meds was submitted with last office notes and labs awaiting approval or denial.  [id number]

## 2023-09-05 NOTE — Telephone Encounter (Signed)
Decision:Denied  Kenneth Stallworth (KeyBeverely Risen) PA Case ID #: [id number] Need Help? Call us at [phone] Outcome Denied on January 29 by [employer] 2017 Denied Drug FreeStyle [place] 3 Sensor ePA cloud logo Form [employer] [place] Electronic Prior Authorization Form    Durward Mallard are you able to help assist with patient getting a CBG monitor? I submitted a PA for Dexcom and it was denied as well.

## 2023-09-09 ENCOUNTER — Other Ambulatory Visit (HOSPITAL_COMMUNITY): Payer: Self-pay

## 2023-09-09 ENCOUNTER — Other Ambulatory Visit: Payer: Self-pay

## 2023-09-10 ENCOUNTER — Other Ambulatory Visit: Payer: Self-pay

## 2023-09-10 ENCOUNTER — Ambulatory Visit: Payer: Medicaid Other

## 2023-09-10 ENCOUNTER — Encounter
Admission: RE | Disposition: A | Discharge status: Home / Self Care | Payer: Self-pay | Source: Ambulatory Visit | Attending: Gastroenterology

## 2023-09-10 ENCOUNTER — Encounter: Payer: Self-pay | Admitting: Gastroenterology

## 2023-09-10 ENCOUNTER — Other Ambulatory Visit (HOSPITAL_COMMUNITY): Payer: Self-pay

## 2023-09-10 ENCOUNTER — Ambulatory Visit
Admission: RE | Admit: 2023-09-10 | Discharge: 2023-09-10 | Disposition: A | Discharge status: Home / Self Care | Payer: Medicaid Other | Source: Ambulatory Visit | Attending: Gastroenterology | Admitting: Gastroenterology

## 2023-09-10 DIAGNOSIS — Z833 Family history of diabetes mellitus: Secondary | ICD-10-CM | POA: Insufficient documentation

## 2023-09-10 DIAGNOSIS — E119 Type 2 diabetes mellitus without complications: Secondary | ICD-10-CM | POA: Insufficient documentation

## 2023-09-10 DIAGNOSIS — F419 Anxiety disorder, unspecified: Secondary | ICD-10-CM | POA: Insufficient documentation

## 2023-09-10 DIAGNOSIS — R519 Headache, unspecified: Secondary | ICD-10-CM | POA: Diagnosis not present

## 2023-09-10 DIAGNOSIS — Z79899 Other long term (current) drug therapy: Secondary | ICD-10-CM | POA: Diagnosis not present

## 2023-09-10 DIAGNOSIS — F17210 Nicotine dependence, cigarettes, uncomplicated: Secondary | ICD-10-CM | POA: Insufficient documentation

## 2023-09-10 DIAGNOSIS — Z1211 Encounter for screening for malignant neoplasm of colon: Secondary | ICD-10-CM

## 2023-09-10 DIAGNOSIS — K5900 Constipation, unspecified: Secondary | ICD-10-CM | POA: Insufficient documentation

## 2023-09-10 DIAGNOSIS — I10 Essential (primary) hypertension: Secondary | ICD-10-CM | POA: Diagnosis not present

## 2023-09-10 DIAGNOSIS — F32A Depression, unspecified: Secondary | ICD-10-CM | POA: Insufficient documentation

## 2023-09-10 DIAGNOSIS — Z7982 Long term (current) use of aspirin: Secondary | ICD-10-CM | POA: Diagnosis not present

## 2023-09-10 DIAGNOSIS — Z7984 Long term (current) use of oral hypoglycemic drugs: Secondary | ICD-10-CM | POA: Insufficient documentation

## 2023-09-10 HISTORY — PX: COLONOSCOPY WITH PROPOFOL: SHX5780

## 2023-09-10 LAB — GLUCOSE, CAPILLARY: Glucose-Capillary: 110 mg/dL — ABNORMAL HIGH (ref 70–99)

## 2023-09-10 SURGERY — COLONOSCOPY WITH PROPOFOL
Anesthesia: General

## 2023-09-10 MED ORDER — SIMETHICONE 40 MG/0.6ML PO SUSP
ORAL | Status: DC | PRN
  Administered 2023-09-10: 40 mg via ORAL

## 2023-09-10 MED ORDER — SODIUM CHLORIDE 0.9 % IV SOLN: INTRAVENOUS | Status: DC

## 2023-09-10 MED ORDER — PROPOFOL 500 MG/50ML IV EMUL
INTRAVENOUS | Status: DC | PRN
  Administered 2023-09-10: 30 mg via INTRAVENOUS
  Administered 2023-09-10: 150 ug/kg/min via INTRAVENOUS
  Administered 2023-09-10: 50 mg via INTRAVENOUS

## 2023-09-10 NOTE — Anesthesia Preprocedure Evaluation (Signed)
Anesthesia Evaluation  Patient identified by MRN, date of birth, ID band Patient awake    Reviewed: Allergy & Precautions, NPO status , Patient's Chart, lab work & pertinent test results  History of Anesthesia Complications Negative for: history of anesthetic complications  Airway Mallampati: II  TM Distance: >3 FB Neck ROM: full    Dental no notable dental hx.    Pulmonary neg pulmonary ROS, Current Smoker   Pulmonary exam normal        Cardiovascular hypertension, On Medications negative cardio ROS Normal cardiovascular exam     Neuro/Psych  Headaches PSYCHIATRIC DISORDERS Anxiety Depression       GI/Hepatic negative GI ROS, Neg liver ROS,,,  Endo/Other  negative endocrine ROSdiabetes, Poorly Controlled, Type 2, Oral Hypoglycemic Agents    Renal/GU negative Renal ROS  negative genitourinary   Musculoskeletal   Abdominal   Peds  Hematology negative hematology ROS (+)   Anesthesia Other Findings Past Medical History: No date: Anxiety No date: Cancer [facility])     Comment:  ovarian 2001 and 2010 No date: Depression 05/04/2022: Essential hypertension 2011: Herniated disc, cervical No date: Hypertension No date: Migraine No date: Pityriasis rosea 05/04/2022: Uncontrolled type 2 diabetes mellitus with hyperglycemia  (HCC) 08/17/2022: URI, acute  Past Surgical History: No date: ABDOMINAL HYSTERECTOMY No date: OVARIAN CANCER  BMI    Body Mass Index: 35.41 kg/m      Reproductive/Obstetrics negative OB ROS                             Anesthesia Physical Anesthesia Plan  ASA: 3  Anesthesia Plan: General   Post-op Pain Management: Minimal or no pain anticipated   Induction: Intravenous  PONV Risk Score and Plan: 2 and Propofol infusion and TIVA  Airway Management Planned: Natural Airway and Nasal Cannula  Additional Equipment:   Intra-op Plan:   Post-operative Plan:    Informed Consent: I have reviewed the patients History and Physical, chart, labs and discussed the procedure including the risks, benefits and alternatives for the proposed anesthesia with the patient or authorized representative who has indicated his/her understanding and acceptance.     Dental Advisory Given  Plan Discussed with: Anesthesiologist, CRNA and Surgeon  Anesthesia Plan Comments: (Patient consented for risks of anesthesia including but not limited to:  - adverse reactions to medications - risk of airway placement if required - damage to eyes, teeth, lips or other oral mucosa - nerve damage due to positioning  - sore throat or hoarseness - Damage to heart, brain, nerves, lungs, other parts of body or loss of life  Patient voiced understanding and assent.)       Anesthesia Quick Evaluation

## 2023-09-10 NOTE — Transfer of Care (Signed)
 Immediate Anesthesia Transfer of Care Note  Patient: Regina Cantu  Procedure(s) Performed: COLONOSCOPY WITH PROPOFOL   Patient Location: PACU  Anesthesia Type:General  Level of Consciousness: drowsy  Airway & Oxygen Therapy: Patient Spontanous Breathing  Post-op Assessment: Report given to RN and Post -op Vital signs reviewed and stable  Post vital signs: Reviewed and stable  Last Vitals:  Vitals Value Taken Time  BP 95/56 09/10/23 0846  Temp 36.4 C 09/10/23 0846  Pulse 72 09/10/23 0847  Resp 13 09/10/23 0847  SpO2 99 % 09/10/23 0847  Vitals shown include unfiled device data.  Last Pain:  Vitals:   09/10/23 0846  TempSrc: Temporal  PainSc: Asleep         Complications: There were no known notable events for this encounter.

## 2023-09-10 NOTE — Op Note (Addendum)
 [facility] Gastroenterology Patient Name: Regina Cantu Procedure Date: 09/10/2023 8:21 AM MRN: [id number] Account #: [id number] Date of Birth: 1980/12/10 Admit Type: Outpatient Age: 43 Room: [facility] ENDO ROOM 4 Gender: Female Note Status: Finalized Instrument Name: [id number] Procedure:             Colonoscopy Indications:           Constipation Providers:             Rogelia Copping MD, MD Referring MD:          Anna SAILOR. Atway (Referring MD) Medicines:             Propofol  per Anesthesia Complications:         No immediate complications. Procedure:             Pre-Anesthesia Assessment:                        - Prior to the procedure, a History and Physical was                         performed, and patient medications and allergies were                         reviewed. The patient's tolerance of previous                         anesthesia was also reviewed. The risks and benefits                         of the procedure and the sedation options and risks                         were discussed with the patient. All questions were                         answered, and informed consent was obtained. Prior                         Anticoagulants: The patient has taken no anticoagulant                         or antiplatelet agents. ASA Grade Assessment: II - A                         patient with mild systemic disease. After reviewing                         the risks and benefits, the patient was deemed in                         satisfactory condition to undergo the procedure.                        After obtaining informed consent, the colonoscope was                         passed under direct vision. Throughout the procedure,  the patient's blood pressure, pulse, and oxygen                         saturations were monitored continuously. The                         Colonoscope was introduced through the anus and                          advanced to the the cecum, identified by appendiceal                         orifice and ileocecal valve. The colonoscopy was                         performed without difficulty. The patient tolerated                         the procedure well. The quality of the bowel                         preparation was good. Findings:      The perianal and digital rectal examinations were normal.      The colon (entire examined portion) appeared normal. Impression:            - The entire examined colon is normal.                        - No specimens collected. Recommendation:        - Discharge patient to home.                        - Resume previous diet.                        - Continue present medications.                        - Repeat colonoscopy in 10 years for screening                         purposes. Procedure Code(s):     --- Professional ---                        [id number], Colonoscopy, flexible; diagnostic, including                         collection of specimen(s) by brushing or washing, when                         performed (separate procedure) Diagnosis Code(s):     --- Professional ---                        K59.00, Constipation, unspecified CPT copyright 2022 American Medical Association. All rights reserved. The codes documented in this report are preliminary and upon coder review may  be revised to meet current compliance requirements. Rogelia Copping MD, MD 09/10/2023 8:44:32 AM This report has been signed electronically. Number of Addenda: 0 Note Initiated On: 09/10/2023 8:21 AM Scope  Withdrawal Time: 0 hours 6 minutes 25 seconds  Total Procedure Duration: 0 hours 9 minutes 43 seconds  Estimated Blood Loss:  Estimated blood loss: none.      [facility]

## 2023-09-10 NOTE — H&P (Signed)
 [place]ina Copping, MD [facility] [address] [place], [place] [address] [phone] Fax : [phone]  Primary Care Physician:  Jimmy Anna SAILOR, DO Primary Gastroenterologist:  Dr. Copping  Pre-Procedure History & Physical: HPI:  [name] is a 43 y.o. female is here for an colonoscopy.   Past Medical History:  Diagnosis Date   Anxiety    Cancer (HCC)    ovarian 2001 and 2010   Depression    Essential hypertension 05/04/2022   Herniated disc, cervical 2011   Hypertension    Migraine    Pityriasis rosea    Uncontrolled type 2 diabetes mellitus with hyperglycemia (HCC) 05/04/2022   URI, acute 08/17/2022    Past Surgical History:  Procedure Laterality Date   ABDOMINAL HYSTERECTOMY     OVARIAN CANCER      Prior to Admission medications   Medication Sig Start Date End Date Taking? Authorizing Provider  amLODipine  (NORVASC ) 5 MG tablet Take 1 tablet (5 mg total) by mouth daily. 04/15/23  Yes Gregary Sharper, MD  aspirin  EC 81 MG tablet Take 81 mg by mouth daily. Swallow whole.   Yes [provider]  atorvastatin  (LIPITOR) 80 MG tablet Take 1 tablet (80 mg total) by mouth daily. 04/15/23  Yes Gregary Sharper, MD  linaclotide  (LINZESS ) 145 MCG CAPS capsule Take 1 capsule (145 mcg total) by mouth daily before breakfast. 04/15/23  Yes Gregary Sharper, MD  losartan -hydrochlorothiazide  (HYZAAR ) 50-12.5 MG tablet Take 1 tablet by mouth daily. 04/15/23  Yes Gregary Sharper, MD  methocarbamol  (ROBAXIN ) 500 MG tablet Take 1 tablet (500 mg total) by mouth every 8 (eight) hours as needed (muscle spasms, pain). 08/01/23  Yes Georgina Sharper LABOR, MD  omeprazole  (PRILOSEC) 20 MG capsule Take 1 capsule (20 mg total) by mouth daily. 04/15/23  Yes Gregary Sharper, MD  Rimegepant Sulfate  (NURTEC) 75 MG TBDP Take 1/2 tablet (37.5 mg total) by mouth daily as needed 10/18/22  Yes Atway, Rayann N, DO  topiramate  (TOPAMAX ) 25 MG tablet Take 1 tablet (25 mg total) by mouth at bedtime. 08/28/23   Yes Atway, Rayann N, DO  venlafaxine  XR (EFFEXOR -XR) 150 MG 24 hr capsule Take 1 capsule (150 mg total) by mouth daily with breakfast. 08/28/23  Yes Atway, Rayann N, DO  Continuous Glucose Receiver (DEXCOM G7 RECEIVER) DEVI Use as directed 03/21/23   Kandis Perkins, DO  Continuous Glucose Sensor (FREESTYLE LIBRE 3 SENSOR) MISC Place 1 sensor on the skin every 14 days. Use to check glucose continuously 09/04/23   Tobie Gaines, DO  Dulaglutide  (TRULICITY ) 0.75 MG/0.5ML SOAJ Inject 0.75 mg into the skin once a week. 07/22/23   Atway, Rayann N, DO  EPINEPHrine  0.3 mg/0.3 mL IJ SOAJ injection Inject 0.3 mg into the muscle as needed for anaphylaxis. 03/20/23   Kandis Perkins, DO  FARXIGA  5 MG TABS tablet Take 1 tablet (5 mg total) by mouth daily before breakfast. 04/15/23   Gregary Sharper, MD  lidocaine  (LIDODERM ) 5 % Place 1 patch onto the skin daily. Remove & Discard patch within 12 hours or as directed by MD 08/01/23   Georgina Sharper LABOR, MD  ondansetron  (ZOFRAN ) 4 MG tablet Take 1 tablet (4 mg total) by mouth every 8 (eight) hours as needed for up to 15 doses for nausea or vomiting. 08/09/23   Cottie Donnice PARAS, MD    Allergies as of 08/06/2023 - Review Complete 07/29/2023  Allergen Reaction Noted   Bee venom Anaphylaxis 06/01/2022   Mushroom extract complex (do not select)  Anaphylaxis 05/04/2022   Wasp venom Anaphylaxis 06/01/2022   Latex Rash 06/01/2022   Penicillins Hives, Nausea And Vomiting, and Rash 03/04/2013    Family History  Problem Relation Age of Onset   Hyperlipidemia Mother    Hypertension Mother    Diabetes Mother    Stroke Mother    Cerebral aneurysm Mother    Cerebral aneurysm Father    Cancer Father    Stroke Father    Breast cancer Maternal Aunt 15   Aneurysm Cousin    Heart Problems Cousin    Heart Problems Cousin    Heart Problems Cousin     Social History   Socioeconomic History   Marital status: Single    Spouse name: Not on file   Number of children: Not on file    Years of education: Not on file   Highest education level: Not on file  Occupational History   Not on file  Tobacco Use   Smoking status: Every Day    Current packs/day: 0.50    Average packs/day: 0.5 packs/day for 25.3 years (12.6 ttl pk-yrs)    Types: Cigarettes    Start date: 06/06/1998   Smokeless tobacco: Never   Tobacco comments:    3 cigs per day  Vaping Use   Vaping status: Never Used  Substance and Sexual Activity   Alcohol use: Not Currently    Comment: rare   Drug use: Yes    Types: Marijuana    Comment: uses to eat   Sexual activity: Not Currently    Partners: Male  Other Topics Concern   Not on file  Social History Narrative   Caffiene rare   Working:  not right now,  trying to get job   Lives with partner.   Social Drivers of Health   Financial Resource Strain: Medium Risk (09/13/2022)   Overall Financial Resource Strain (CARDIA)    Difficulty of Paying Living Expenses: Somewhat hard  Food Insecurity: Food Insecurity Present (09/13/2022)   Hunger Vital Sign    Worried About Running Out of Food in the Last Year: Often true    Ran Out of Food in the Last Year: Often true  Transportation Needs: No Transportation Needs (09/13/2022)   PRAPARE - Administrator, Civil Service (Medical): No    Lack of Transportation (Non-Medical): No  Physical Activity: Not on file  Stress: Stress Concern Present (09/13/2022)   [employer] of Occupational Health - Occupational Stress Questionnaire    Feeling of Stress : Very much  Social Connections: Moderately Integrated (09/13/2022)   Social Connection and Isolation Panel [NHANES]    Frequency of Communication with Friends and Family: Once a week    Frequency of Social Gatherings with Friends and Family: Once a week    Attends Religious Services: 1 to 4 times per year    Active Member of [employer] or Organizations: No    Attends Banker Meetings: 1 to 4 times per year    Marital Status: Living with partner   Intimate Partner Violence: Not At Risk (09/13/2022)   Humiliation, Afraid, Rape, and Kick questionnaire    Fear of Current or Ex-Partner: No    Emotionally Abused: No    Physically Abused: No    Sexually Abused: No    Review of Systems: See HPI, otherwise negative ROS  Physical Exam: BP 128/79   Pulse 71   Temp (!) 97.1 F (36.2 C) (Temporal)   Resp 18   Ht 5'  2 (1.575 m)   Wt 87.8 kg   SpO2 100%   BMI 35.41 kg/m  General:   Alert,  pleasant and cooperative in NAD Head:  Normocephalic and atraumatic. Neck:  Supple; no masses or thyromegaly. Lungs:  Clear throughout to auscultation.    Heart:  Regular rate and rhythm. Abdomen:  Soft, nontender and nondistended. Normal bowel sounds, without guarding, and without rebound.   Neurologic:  Alert and  oriented x4;  grossly normal neurologically.  Impression/Plan: Shaqueta Jazara Swiney is here for an colonoscopy to be performed for constipation   Risks, benefits, limitations, and alternatives regarding  colonoscopy have been reviewed with the patient.  Questions have been answered.  All parties agreeable.   [place] Copping, MD  09/10/2023, 8:47 AM

## 2023-09-10 NOTE — Anesthesia Postprocedure Evaluation (Signed)
 Anesthesia Post Note  Patient: Leniyah Onita Yerby  Procedure(s) Performed: COLONOSCOPY WITH PROPOFOL   Patient location during evaluation: Endoscopy Anesthesia Type: General Level of consciousness: awake and alert Pain management: pain level controlled Vital Signs Assessment: post-procedure vital signs reviewed and stable Respiratory status: spontaneous breathing, nonlabored ventilation, respiratory function stable and patient connected to nasal cannula oxygen Cardiovascular status: blood pressure returned to baseline and stable Postop Assessment: no apparent nausea or vomiting Anesthetic complications: no   There were no known notable events for this encounter.   Last Vitals:  Vitals:   09/10/23 0856 09/10/23 0906  BP: 132/67 122/71  Pulse:    Resp:    Temp:    SpO2:      Last Pain:  Vitals:   09/10/23 0906  TempSrc:   PainSc: 0-No pain                 Lendia LITTIE Mae

## 2023-09-11 ENCOUNTER — Encounter: Payer: Self-pay | Admitting: Gastroenterology

## 2023-09-12 ENCOUNTER — Telehealth: Payer: Self-pay

## 2023-09-12 NOTE — Telephone Encounter (Signed)
 Pharmacy Patient Advocate Encounter   Received notification from CoverMyMeds that prior authorization for FREESTYLE LIBRE 3 SENSOR is required/requested.   Insurance verification completed.   The patient is insured through [facility] .   PA required; PA submitted to above mentioned insurance via CoverMyMeds Key/confirmation #/[employer]. Status is pending  Patient will most likely need to be on insulin  to receive CGM

## 2023-09-13 ENCOUNTER — Ambulatory Visit: Payer: Medicaid Other | Admitting: Student

## 2023-09-13 ENCOUNTER — Other Ambulatory Visit (HOSPITAL_COMMUNITY): Payer: Self-pay

## 2023-09-13 VITALS — BP 121/75 | HR 89 | Temp 97.7°F | Ht 62.0 in | Wt 193.5 lb

## 2023-09-13 DIAGNOSIS — K219 Gastro-esophageal reflux disease without esophagitis: Secondary | ICD-10-CM | POA: Diagnosis not present

## 2023-09-13 DIAGNOSIS — I10 Essential (primary) hypertension: Secondary | ICD-10-CM

## 2023-09-13 DIAGNOSIS — G43909 Migraine, unspecified, not intractable, without status migrainosus: Secondary | ICD-10-CM

## 2023-09-13 DIAGNOSIS — G43719 Chronic migraine without aura, intractable, without status migrainosus: Secondary | ICD-10-CM

## 2023-09-13 MED ORDER — OMEPRAZOLE 20 MG PO CPDR
40.0000 mg | DELAYED_RELEASE_CAPSULE | Freq: Every day | ORAL | 2 refills | Status: DC
  Filled 2023-09-13 – 2023-09-16 (×2): qty 60, 30d supply, fill #0
  Filled 2023-10-11 – 2023-11-15 (×3): qty 60, 30d supply, fill #1
  Filled 2023-12-31: qty 60, 30d supply, fill #2

## 2023-09-13 MED ORDER — TOPIRAMATE 25 MG PO TABS
50.0000 mg | ORAL_TABLET | Freq: Every day | ORAL | 1 refills | Status: DC
  Filled 2023-09-13: qty 30, 15d supply, fill #0

## 2023-09-13 MED ORDER — ONDANSETRON HCL 4 MG PO TABS
4.0000 mg | ORAL_TABLET | Freq: Three times a day (TID) | ORAL | 0 refills | Status: DC | PRN
  Filled 2023-09-13: qty 15, 5d supply, fill #0

## 2023-09-13 MED ORDER — TOPIRAMATE 25 MG PO TABS
50.0000 mg | ORAL_TABLET | Freq: Every day | ORAL | 1 refills | Status: DC
  Filled 2023-09-13 – 2023-09-16 (×2): qty 60, 30d supply, fill #0
  Filled 2023-10-11 – 2023-10-14 (×2): qty 60, 30d supply, fill #1

## 2023-09-13 MED ORDER — NURTEC 75 MG PO TBDP
75.0000 mg | ORAL_TABLET | ORAL | 1 refills | Status: DC
  Filled 2023-09-13: qty 30, 60d supply, fill #0
  Filled 2023-09-16 – 2023-09-23 (×2): qty 15, 30d supply, fill #0
  Filled 2023-10-30: qty 15, 30d supply, fill #1

## 2023-09-13 NOTE — Telephone Encounter (Signed)
 Per Dr.Patel on 09/05/23 Diabetes is well-controlled.  Patient not on insulin therapy.  I doubt they will cover a CGM.  We do not have to pursue this further.

## 2023-09-13 NOTE — Progress Notes (Signed)
 Established Patient Office Visit  Subjective   Patient ID: [place], female    DOB: [birth year]  Age: 43 y.o. MRN: [id number]  Chief Complaint  Patient presents with   Follow-up    Routine office visit for dm    Patient is a 43 y.o. with a past medical history stated below who presents today for follow-up for HTN, migraines, and GERD. She was last seen at [facility] for a telehealth visit on 1/03. Please see problem based assessment and plan for additional details.     Past Medical History:  Diagnosis Date   Anxiety    Cancer (HCC)    ovarian 2001 and 2010   Depression    Essential hypertension 05/04/2022   Herniated disc, cervical 2011   Hypertension    Migraine    Pityriasis rosea    Uncontrolled type 2 diabetes mellitus with hyperglycemia (HCC) 05/04/2022   URI, acute 08/17/2022      Review of Systems  Respiratory:  Negative for shortness of breath.   Cardiovascular:  Negative for chest pain, palpitations and leg swelling.  Gastrointestinal:  Positive for constipation and nausea.       Gas, burping  Neurological:  Positive for headaches.      Objective:     BP 121/75 (BP Location: Right Arm, Patient Position: Sitting, Cuff Size: Normal)   Pulse 89   Temp 97.7 F (36.5 C) (Oral)   Ht 5' 2 (1.575 m)   Wt 193 lb 8 oz (87.8 kg)   SpO2 100%   BMI 35.39 kg/m  BP Readings from Last 3 Encounters:  09/13/23 121/75  09/10/23 122/71  08/09/23 111/74   Wt Readings from Last 3 Encounters:  09/13/23 193 lb 8 oz (87.8 kg)  09/10/23 193 lb 9.6 oz (87.8 kg)  06/14/23 193 lb 12.8 oz (87.9 kg)   Physical Exam HENT:     Head: Normocephalic and atraumatic.  Cardiovascular:     Rate and Rhythm: Normal rate and regular rhythm.  Pulmonary:     Effort: Pulmonary effort is normal.     Breath sounds: Normal breath sounds.  Abdominal:     General: Bowel sounds are normal. There is no distension.     Palpations: Abdomen is soft.     Tenderness: There is no  abdominal tenderness.  Musculoskeletal:        General: Normal range of motion.  Skin:    General: Skin is warm and dry.  Neurological:     General: No focal deficit present.     Mental Status: She is alert.  Psychiatric:        Mood and Affect: Mood normal.        Behavior: Behavior normal.    No results found for any visits on 09/13/23.  The ASCVD Risk score (Arnett DK, et al., 2019) failed to calculate for the following reasons:   Risk score cannot be calculated because patient has a medical history suggesting prior/existing ASCVD    Assessment & Plan:   Problem List Items Addressed This Visit     Essential hypertension - Primary   Patient's BP 121/75 today. Taking amlodipine  5 mg daily and losartan -hydrochlorothiazide  50-12.5 mg daily. Tolerating well. Denies any chest pain, shortness of breath, palpitations, or swelling. Last BMP 08/09/23 unremarkable.  Plan - Continue mlodipine 5 mg daily and losartan -hydrochlorothiazide  50-12.5 mg daily      Migraine   Patient continues to have breakthrough migraines while taking daily Nurtec 37.5 mg, patient reports  needing to take an additional dose to help abort her migraines. With patient having migraines almost daily, will change therapy back to one whole pill (75 mg) every other day, with the ability to take one pill days between for breakthrough migraine, not to exceed 75 mg in 24 hours. Patient also taking topiramate  25 mg daily at bedtime. Discussed increasing topiramate  to 50 mg daily and patient calling [facility], where she has previously followed with sleep medicine. Patient agreeable to plan.  Plan - Change from Nurtec 37.5 mg daily to 75 mg every other day for prevention, then patient can take 75 mg between treatment days for breakthrough migraines - Increase topiramate  from 25 mg daily at bedtime to 50 mg daily at bedtime - Patient to make appointment for migraines at [facility] - Return  to clinic in 6-8 weeks or sooner for follow up       Relevant Medications   Rimegepant Sulfate  (NURTEC) 75 MG TBDP   topiramate  (TOPAMAX ) 25 MG tablet   GERD (gastroesophageal reflux disease)   Patient reports gas, burping, nausea, and constipation. Has been experiencing this for months. Recently evaluated by [facility], had a colonoscopy on 2/4 with Dr. Jinny for constipation. Per procedure note, colon appeared normal and recommended repeat colonoscopy in 10 years for regular colon cancer screening. No EGD performed. Patient has been using Linzess  to help regulate bowel movements with some improvement, has also been taking omeprazole  20 mg daily for some time. Endorses improvement in nausea with Zofran . EKG 1/03 with QTc 415. Discussed increasing PPI dose, Zofran  refill, and reaching out to GI for possible EGD and GERD management given continuation of symptoms.   Plan - Increase pantoprazole  from 20 mg daily to 40 mg daily - Continue Linzess  145 mcg daily - Zofran  4 mg q8H as needed for nausea, refilled today  - Connect with [facility] for possible EGD and GERD management      Relevant Medications   ondansetron  (ZOFRAN ) 4 MG tablet   omeprazole  (PRILOSEC) 20 MG capsule   Patient discussed with Dr. Karna.  Return 6-8 weeks, for GERD and migraines.   Kenitra Leventhal Arellano Zameza, MD

## 2023-09-13 NOTE — Telephone Encounter (Signed)
 Pharmacy Patient Advocate Encounter  Received notification from [facility] that Prior Authorization for FREESTYLE LIBRE 3 SENSOR has been DENIED.  Full denial letter will be uploaded to the media tab. See denial reason below.  Patient must have insulin  dependent diabetes  PA #/Case ID/Reference #: [id number]

## 2023-09-13 NOTE — Patient Instructions (Signed)
 Thank you, [name] for allowing us  to provide your care today. Today we discussed your blood pressure, diabetes, reflux symptoms, and migraines.   I have ordered the following labs for you:  Lab Orders  No laboratory test(s) ordered today     Tests ordered today:  None  Referrals ordered today:   Referral Orders  No referral(s) requested today     I have ordered the following medication/changed the following medications:   Stop the following medications: Medications Discontinued During This Encounter  Medication Reason   Rimegepant Sulfate  (NURTEC) 75 MG TBDP    ondansetron  (ZOFRAN ) 4 MG tablet Reorder   topiramate  (TOPAMAX ) 25 MG tablet    topiramate  (TOPAMAX ) 25 MG tablet    omeprazole  (PRILOSEC) 20 MG capsule      Start the following medications: Meds ordered this encounter  Medications   Rimegepant Sulfate  (NURTEC) 75 MG TBDP    Sig: Take 1 tablet (75 mg total) by mouth every other day.    Dispense:  30 tablet    Refill:  1   ondansetron  (ZOFRAN ) 4 MG tablet    Sig: Take 1 tablet (4 mg total) by mouth every 8 (eight) hours as needed for up to 15 doses for nausea or vomiting.    Dispense:  15 tablet    Refill:  0   DISCONTD: topiramate  (TOPAMAX ) 25 MG tablet    Sig: Take 2 tablets (50 mg total) by mouth at bedtime.    Dispense:  30 tablet    Refill:  1   topiramate  (TOPAMAX ) 25 MG tablet    Sig: Take 2 tablets (50 mg total) by mouth at bedtime.    Dispense:  60 tablet    Refill:  1   omeprazole  (PRILOSEC) 20 MG capsule    Sig: Take 2 capsules (40 mg total) by mouth daily.    Dispense:  60 capsule    Refill:  2     Follow up: 6-8 weeks as needed for GERD and migraines   Remember:   FOR migraines:  - You will take Nurtec 75 mg (one whole pill) every other day for prevention. You cannot take more than 75 mg in a 24 hour period. You may take 1 pill (75 mg) if you have a migraine between the days when you take a medication. I have also  increased the dose of your Topiramate  from 25 mg at bedtime daily to 50 mg at bedtime daily. We will reach out to neurology to see if they can give you an appointment to discuss headaches.   FOR GERD/reflux:  - I have increased your pantoprazole  from 20 mg daily to 40 mg daily. I have sent in a refill for your Zofran . We will reach out to gastroenterology for a follow up appointment to discuss ongoing issues despite a normal colonoscopy and medication therapy.    Should you have any questions or concerns please call the internal medicine clinic at [phone].     Josealfredo Adkins Arellano Zameza, MD PGY-1 Internal Medicine Teaching Progam [facility]

## 2023-09-13 NOTE — Progress Notes (Deleted)
   Acute Office Visit  Subjective:     Patient ID: [place], female    DOB: [birth year], 43 y.o.   MRN: [id number]  No chief complaint on file.   HPI Patient is in today for ***  ROS      Objective:    There were no vitals taken for this visit. {Vitals History (Optional):23777}  Physical Exam  No results found for any visits on 09/13/23.      Assessment & Plan:   Problem List Items Addressed This Visit   None   No orders of the defined types were placed in this encounter.   No follow-ups on file.  Ruby Dilone Arellano Zameza, MD

## 2023-09-14 DIAGNOSIS — K219 Gastro-esophageal reflux disease without esophagitis: Secondary | ICD-10-CM | POA: Insufficient documentation

## 2023-09-14 NOTE — Assessment & Plan Note (Signed)
 Patient reports gas, burping, nausea, and constipation. Has been experiencing this for months. Recently evaluated by [facility], had a colonoscopy on 2/4 with Dr. Jinny for constipation. Per procedure note, colon appeared normal and recommended repeat colonoscopy in 10 years for regular colon cancer screening. No EGD performed. Patient has been using Linzess  to help regulate bowel movements with some improvement, has also been taking omeprazole  20 mg daily for some time. Endorses improvement in nausea with Zofran . EKG 1/03 with QTc 415. Discussed increasing PPI dose, Zofran  refill, and reaching out to GI for possible EGD and GERD management given continuation of symptoms.   Plan - Increase pantoprazole  from 20 mg daily to 40 mg daily - Continue Linzess  145 mcg daily - Zofran  4 mg q8H as needed for nausea, refilled today  - Connect with [facility] for possible EGD and GERD management

## 2023-09-14 NOTE — Assessment & Plan Note (Addendum)
 Patient continues to have breakthrough migraines while taking daily Nurtec 37.5 mg, patient reports needing to take an additional dose to help abort her migraines. With patient having migraines almost daily, will change therapy back to one whole pill (75 mg) every other day, with the ability to take one pill days between for breakthrough migraine, not to exceed 75 mg in 24 hours. Patient also taking topiramate  25 mg daily at bedtime. Discussed increasing topiramate  to 50 mg daily and patient calling [facility], where she has previously followed with sleep medicine. Patient agreeable to plan.  Plan - Change from Nurtec 37.5 mg daily to 75 mg every other day for prevention, then patient can take 75 mg between treatment days for breakthrough migraines - Increase topiramate  from 25 mg daily at bedtime to 50 mg daily at bedtime - Patient to make appointment for migraines at [facility] - Return to clinic in 6-8 weeks or sooner for follow up

## 2023-09-14 NOTE — Assessment & Plan Note (Signed)
 Patient's BP 121/75 today. Taking amlodipine  5 mg daily and losartan -hydrochlorothiazide  50-12.5 mg daily. Tolerating well. Denies any chest pain, shortness of breath, palpitations, or swelling. Last BMP 08/09/23 unremarkable.  Plan - Continue mlodipine 5 mg daily and losartan -hydrochlorothiazide  50-12.5 mg daily

## 2023-09-16 ENCOUNTER — Other Ambulatory Visit (HOSPITAL_COMMUNITY): Payer: Self-pay

## 2023-09-16 ENCOUNTER — Ambulatory Visit: Payer: Medicaid Other | Admitting: Orthopedic Surgery

## 2023-09-17 ENCOUNTER — Telehealth: Payer: Self-pay

## 2023-09-17 ENCOUNTER — Other Ambulatory Visit (HOSPITAL_COMMUNITY): Payer: Self-pay

## 2023-09-17 NOTE — Telephone Encounter (Signed)
Prior Authorization for patient (Nurtec 75MG  dispersible tablets) came through on cover my meds was submitted with last office notes awaiting approval or denial.  HQI:[id number]

## 2023-09-18 ENCOUNTER — Ambulatory Visit (INDEPENDENT_AMBULATORY_CARE_PROVIDER_SITE_OTHER): Payer: Medicaid Other | Admitting: Orthopedic Surgery

## 2023-09-18 DIAGNOSIS — M25512 Pain in left shoulder: Secondary | ICD-10-CM

## 2023-09-18 NOTE — Addendum Note (Signed)
Addended by: Dickie La on: 09/18/2023 10:21 AM   Modules accepted: Level of Service

## 2023-09-18 NOTE — Progress Notes (Signed)
 Internal Medicine Clinic Attending  Case discussed with the resident at the time of the visit.  We reviewed the resident's history and exam and pertinent patient test results.  I agree with the assessment, diagnosis, and plan of care documented in the resident's note.

## 2023-09-18 NOTE — Telephone Encounter (Signed)
Decision:  Talmadge Chad (KeyLorenza [place]) PA Case ID #: [id number] Rx #: [id number] Need Help? Call us at [phone] Outcome N/A on February 11 by [employer] 2017 No Authorization Required.Prior Authorization Not Required.'The medication you are requesting has an active authorization on file. The authorization end date is 03/14/2024. If the pharmacy is having issues processing the claim, please have them call Pharmacy Services for assistance at [phone]. Drug Nurtec 75MG  dispersible tablets ePA cloud logo Form [employer] Electronic Prior Authorization Form Original Claim Info 75

## 2023-09-18 NOTE — Progress Notes (Signed)
Orthopedic Surgery Progress Note   Assessment: Patient is a 43 y.o. female with left rotator cuff tendinopathy   Plan: -No operative plans -Recommended PT and NSAIDs. PT referral provided to her today -Weight bearing status: as tolerated, range of motion as tolerated -Follow-up on an as-needed basis  ___________________________________________________________________________  Subjective: Patient is still having left shoulder pain.  She notes that anytime she goes to elevate her arm.  She also notes that when she pushes a door open.  She does not have as much pain at rest.  She sometimes feels that the shoulder is going to pop out of place when she holds it in the wrong position or is doing stretching.  She has not developed any new symptoms in her left shoulder since she was last seen.   Physical Exam:  General: no acute distress, appears stated age Neurologic: alert, answering questions appropriately, following commands Respiratory: unlabored breathing on room air, symmetric chest rise Psychiatric: appropriate affect, normal cadence to speech  MSK:   -Left upper extremity  Pain with external rotation past 70 degrees, no pain with internal rotation, pain but no weakness with Jobe, no weakness with external rotation with arm at side, negative belly press  AIN/PIN/IO intact  Palpable radial pulse  Sensation intact to light touch in median/ulnar/radial/axillary nerve distributions  Hand warm and well perfused  Imaging: MRI of the left shoulder from 09/04/2023 was independently reviewed and interpreted, showing infraspinatus tendinopathy.  Increased signal near the footprint of the supraspinatus as well.  No rotator cuff tear seen.   Patient name: Regina [place] Patient MRN: [id number] Date: 09/18/23

## 2023-09-23 ENCOUNTER — Other Ambulatory Visit (HOSPITAL_COMMUNITY): Payer: Self-pay

## 2023-09-26 ENCOUNTER — Other Ambulatory Visit (HOSPITAL_COMMUNITY): Payer: Self-pay

## 2023-10-01 NOTE — Therapy (Signed)
 OUTPATIENT PHYSICAL THERAPY SHOULDER EVALUATION   Patient Name: Regina Cantu MRN: [id number] DOB:1980/12/06, 43 y.o., female Today's Date: 10/03/2023  END OF SESSION:  PT End of Session - 10/02/23 1328     Visit Number 1    Number of Visits 13    Date for PT Re-Evaluation 11/22/23    Authorization Type [place] MEDICA[employer] [place]    Authorization - Visit Number 1    Authorization - Number of Visits 27    PT Start Time 1330    PT Stop Time 1415    PT Time Calculation (min) 45 min    Activity Tolerance Patient tolerated treatment well    Behavior During Therapy [facility] for tasks assessed/performed             Past Medical History:  Diagnosis Date   Anxiety    Cancer (HCC)    ovarian 2001 and 2010   Depression    Essential hypertension 05/04/2022   Herniated disc, cervical 2011   Hypertension    Migraine    Pityriasis rosea    Uncontrolled type 2 diabetes mellitus with hyperglycemia (HCC) 05/04/2022   URI, acute 08/17/2022   Past Surgical History:  Procedure Laterality Date   ABDOMINAL HYSTERECTOMY     COLONOSCOPY WITH PROPOFOL N/A 09/10/2023   Procedure: COLONOSCOPY WITH PROPOFOL;  Surgeon: Midge Minium, MD;  Location: [facility] ENDOSCOPY;  Service: Endoscopy;  Laterality: N/A;   OVARIAN CANCER     Patient Active Problem List   Diagnosis Date Noted   GERD (gastroesophageal reflux disease) 09/14/2023   Constipation 09/10/2023   LOC (loss of consciousness) (HCC) 08/09/2023   Incontinence of feces 08/09/2023   Back pain 03/18/2023   Abnormal Pap smear of cervix 12/27/2022   Tobacco use 08/17/2022   Depression 08/17/2022   Healthcare maintenance 07/04/2022   Migraine 07/04/2022   History of CVA (cerebrovascular accident) 05/04/2022   History of ovarian cancer 05/04/2022   Uncontrolled type 2 diabetes mellitus with hyperglycemia (HCC) 05/04/2022   Essential hypertension 05/04/2022   Malignant (primary) neoplasm, unspecified (HCC) 05/24/2021   Other  specified disorders of eustachian tube, bilateral 05/24/2021   Allergy, unspecified, initial encounter 05/24/2021    PCP: Chauncey Mann, DO  REFERRING PROVIDER: London Sheer, MD   REFERRING DIAG: [id number] (ICD-10-CM) - Acute pain of left shoulder   THERAPY DIAG:  Chronic left shoulder pain  Muscle weakness (generalized)  Abnormal posture  Rationale for Evaluation and Treatment: Rehabilitation  ONSET DATE: 2 months ago  SUBJECTIVE:  SUBJECTIVE STATEMENT: Pt reports approx 2 months ago she was driving and trying to turn her car by palming the steering wheel with her L hand. She states the L shoulder locked up on her and she had to use her R hand to continue driving. Currently, the L shoulder is not as painful, but feels weak and "disconnected".  Hand dominance: Right  PERTINENT HISTORY: DM2, high BMI, Hx of cervical herniated disc 2011  PAIN:  Are you having pain? Yes: NPRS scale: 3/10. Pain range during week prior to start of PT: 2-8/10. Pain location: L shoulder and neck Pain description: sharp, bone is stuck, ache Aggravating factors: Forcing movement when stuck Relieving factors: Finding the right position for the L arm, cold pack, sling  PRECAUTIONS: None  RED FLAGS: None   WEIGHT BEARING RESTRICTIONS: No  FALLS:  Has patient fallen in last 6 months? No  LIVING ENVIRONMENT: Lives with: lives with their family Lives in: House/apartment Able to access home  OCCUPATION: Unemployed  PLOF: Independent  PATIENT GOALS:No appt scheduled  NEXT MD VISIT:   OBJECTIVE:  Note: Objective measures were completed at Evaluation unless otherwise noted.  DIAGNOSTIC FINDINGS:  MRI of the left shoulder from 09/04/2023 was independently reviewed and interpreted, showing infraspinatus  tendinopathy.  Increased signal near the footprint of the supraspinatus as well.  No rotator cuff tear seen.   PATIENT SURVEYS:  Quick Dash 42/55=75%  COGNITION: Overall cognitive status: Within functional limits for tasks assessed     SENSATION: WFL  POSTURE: Rounded shoulders  UPPER EXTREMITY ROM:   With all AROM L shoulder movements, the pt describe a burning sensation of the GH jt region.  With PROM for flexion and abd, pt reported pain near end range and N/T of her hand Active ROM Right eval Left eval  Shoulder flexion A 120 A 95, P   Shoulder extension    Shoulder abduction A 132 A 80  Shoulder adduction    Shoulder internal rotation A T11 A PSIS  Shoulder external rotation A T2 A UT  Elbow flexion    Elbow extension    Wrist flexion    Wrist extension    Wrist ulnar deviation    Wrist radial deviation    Wrist pronation    Wrist supination    (Blank rows = not tested)  UPPER EXTREMITY MMT:  R WNLs 4=-5/5. L Approx 3/5 with give way to pain for all movements MMT Right eval Left eval  Shoulder flexion  3/5  Shoulder extension  3/5  Shoulder abduction  3/5  Shoulder adduction    Shoulder internal rotation  3/5  Shoulder external rotation  3/5  Middle trapezius    Lower trapezius    Elbow flexion    Elbow extension    Wrist flexion    Wrist extension    Wrist ulnar deviation    Wrist radial deviation    Wrist pronation    Wrist supination    Grip strength (lbs)    (Blank rows = not tested)  SHOULDER SPECIAL TESTS: Impingement tests: Unable to assess properly due to L shoulder irritability SLAP lesions:  NT Instability tests:  NT Rotator cuff assessment: Unable to assess properly due to L shoulder irritability Adson's Test: Positive  JOINT MOBILITY TESTING:  Stable   PALPATION:  TTP to the peri-GH jt and upper trap  TREATMENT DATE:  [facility] Adult PT Treatment:                                                DATE: 10/02/23 Therapeutic Exercise: Developed, instructed in, and pt completed therex as noted in HEP  Self Care: Use of heating pad or cold pack for pain management  PATIENT EDUCATION: Education details: Eval findings, POC, HEP, self care  Person educated: Patient Education method: Explanation, Demonstration, Tactile cues, Verbal cues, and Handouts Education comprehension: verbalized understanding, returned demonstration, verbal cues required, and tactile cues required  HOME EXERCISE PROGRAM: Access Code: [email or web address] URL: [email or web address] Date: 10/02/2023 Prepared by: Joellyn Rued  Exercises - Seated Scalenes Stretch (Mirrored)  - 3 x daily - 7 x weekly - 1 sets - 3 reps - 20 hold - Standing Shoulder Row with Anchored Resistance  - 1 x daily - 7 x weekly - 2 sets - 10 reps - 3 hold  ASSESSMENT:  CLINICAL IMPRESSION: Patient is a 43 y.o. female who was seen today for physical therapy evaluation and treatment for M25.512 (ICD-10-CM) - Acute pain of left shoulder. Pt presents reporting a high level of L shoulder irritability with markedly decreased ROM and strength. A differential dx was hard to determine due ti irritability, but the Adson's test was positive indicating TOS. A HEP was initiated. Pt will benefit from skilled PT 2w6 to address impairments to optimize l shoulder/UE function with less pain.   OBJECTIVE IMPAIRMENTS: decreased ROM, decreased strength, impaired UE functional use, obesity, and pain.   ACTIVITY LIMITATIONS: carrying, lifting, sleeping, dressing, reach over head, hygiene/grooming, and caring for others  PARTICIPATION LIMITATIONS: meal prep, cleaning, laundry, driving, and shopping  PERSONAL FACTORS: Fitness, Past/current experiences, Time since onset of injury/illness/exacerbation, and 3+ comorbidities: DM2, high BMI, Hx of cervical herniated disc 2011  are  also affecting patient's functional outcome.   REHAB POTENTIAL: Good  CLINICAL DECISION MAKING: Evolving/moderate complexity  EVALUATION COMPLEXITY: Moderate   GOALS:   SHORT TERM GOALS = LTGs   LONG TERM GOALS: Target date: 11/22/23  Pt will be Ind in a final HEP to maintain achieved LOF  Baseline: started Goal status: INITIAL  2.  Pt will voice understanding of measures to assist in pain reduction  Baseline: started Goal status: INITIAL  3.  The L shoulder will demonstrated AROMs with 90% of the R for appropriate functional mobility Baseline: see flow sheet Goal status: INITIAL  4.  The L shoulder will demonstrate at least 4/5 strength for approiate functional use of the L shoulder/UE Baseline: see flow sheet Goal status: INITIAL  5.  The pt will be able to reach to cabinet repeatedly with at least 3#, not limited by pain   Baseline:  Goal status: INITIAL  6.   Pt's Quick Dash score will improve to 50% or less as indication of improved function  Baseline: 42/55= 75% Goal status: INITIAL  PLAN:  PT FREQUENCY: 2x/week  PT DURATION: 6 weeks  PLANNED INTERVENTIONS: 97164- PT Re-evaluation, 97110-Therapeutic exercises, 97530- Therapeutic activity, [id number]- Neuromuscular re-education, 97535- Self Care, 16109- Manual therapy, G0283- Electrical stimulation (unattended), 97035- Ultrasound, 60454- Ionotophoresis 4mg /ml Dexamethasone, Patient/Family education, Taping, Dry Needling, Joint mobilization, Cryotherapy, and Moist heat  PLAN FOR NEXT SESSION: Assess response to HEP; progress therex as indicated; use of modalities, manual therapy; and TPDN as indicated.   Kamirah Shugrue MS,  PT 10/03/23 6:01 AM

## 2023-10-02 ENCOUNTER — Ambulatory Visit: Payer: Medicaid Other | Attending: Orthopedic Surgery

## 2023-10-02 ENCOUNTER — Other Ambulatory Visit: Payer: Self-pay

## 2023-10-02 DIAGNOSIS — G8929 Other chronic pain: Secondary | ICD-10-CM | POA: Diagnosis not present

## 2023-10-02 DIAGNOSIS — M6281 Muscle weakness (generalized): Secondary | ICD-10-CM | POA: Insufficient documentation

## 2023-10-02 DIAGNOSIS — M25512 Pain in left shoulder: Secondary | ICD-10-CM | POA: Diagnosis not present

## 2023-10-02 DIAGNOSIS — R293 Abnormal posture: Secondary | ICD-10-CM | POA: Insufficient documentation

## 2023-10-09 ENCOUNTER — Ambulatory Visit: Payer: Medicaid Other | Attending: Orthopedic Surgery | Admitting: Physical Therapy

## 2023-10-09 ENCOUNTER — Telehealth: Payer: Self-pay | Admitting: Physical Therapy

## 2023-10-09 DIAGNOSIS — M25512 Pain in left shoulder: Secondary | ICD-10-CM | POA: Insufficient documentation

## 2023-10-09 DIAGNOSIS — G8929 Other chronic pain: Secondary | ICD-10-CM | POA: Insufficient documentation

## 2023-10-09 DIAGNOSIS — R293 Abnormal posture: Secondary | ICD-10-CM | POA: Insufficient documentation

## 2023-10-09 DIAGNOSIS — M6281 Muscle weakness (generalized): Secondary | ICD-10-CM | POA: Insufficient documentation

## 2023-10-09 NOTE — Therapy (Signed)
 OUTPATIENT PHYSICAL THERAPY SHOULDER TREATMENT   Patient Name: Regina Cantu MRN: [id number] DOB:Aug 04, 1981, 43 y.o., female Today's Date: 10/11/2023  END OF SESSION:  PT End of Session - 10/11/23 0808     Visit Number 2    Number of Visits 13    Date for PT Re-Evaluation 11/22/23    Authorization Type [place] MEDICAID [employer] OF [place]    Authorization - Visit Number 2    Authorization - Number of Visits 27    PT Start Time 0807    PT Stop Time 0845    PT Time Calculation (min) 38 min    Activity Tolerance Patient tolerated treatment well    Behavior During Therapy [facility] for tasks assessed/performed              Past Medical History:  Diagnosis Date   Anxiety    Cancer (HCC)    ovarian 2001 and 2010   Depression    Essential hypertension 05/04/2022   Herniated disc, cervical 2011   Hypertension    Migraine    Pityriasis rosea    Uncontrolled type 2 diabetes mellitus with hyperglycemia (HCC) 05/04/2022   URI, acute 08/17/2022   Past Surgical History:  Procedure Laterality Date   ABDOMINAL HYSTERECTOMY     COLONOSCOPY WITH PROPOFOL N/A 09/10/2023   Procedure: COLONOSCOPY WITH PROPOFOL;  Surgeon: Midge Minium, MD;  Location: [facility] ENDOSCOPY;  Service: Endoscopy;  Laterality: N/A;   OVARIAN CANCER     Patient Active Problem List   Diagnosis Date Noted   GERD (gastroesophageal reflux disease) 09/14/2023   Constipation 09/10/2023   LOC (loss of consciousness) (HCC) 08/09/2023   Incontinence of feces 08/09/2023   Back pain 03/18/2023   Abnormal Pap smear of cervix 12/27/2022   Tobacco use 08/17/2022   Depression 08/17/2022   Healthcare maintenance 07/04/2022   Migraine 07/04/2022   History of CVA (cerebrovascular accident) 05/04/2022   History of ovarian cancer 05/04/2022   Uncontrolled type 2 diabetes mellitus with hyperglycemia (HCC) 05/04/2022   Essential hypertension 05/04/2022   Malignant (primary) neoplasm, unspecified (HCC) 05/24/2021   Other  specified disorders of eustachian tube, bilateral 05/24/2021   Allergy, unspecified, initial encounter 05/24/2021    PCP: Chauncey Mann, DO  REFERRING PROVIDER: London Sheer, MD   REFERRING DIAG: [phone] (ICD-10-CM) - Acute pain of left shoulder   THERAPY DIAG:  Chronic left shoulder pain  Muscle weakness (generalized)  Rationale for Evaluation and Treatment: Rehabilitation  ONSET DATE: 2 months ago  SUBJECTIVE:  SUBJECTIVE STATEMENT: Pt reports the exercises aregoing slow and steady. Pt states her whole l arm went numb without a precepiating incident and it took 30 minutes for ithe feeling to come back with stretching her L arm out to the side.  EVAL: Pt reports approx 2 months ago she was driving and trying to turn her car by palming the steering wheel with her L hand. She states the L shoulder locked up on her and she had to use her R hand to continue driving. Currently, the L shoulder is not as painful, but feels weak and "disconnected".  Hand dominance: Right  PERTINENT HISTORY: DM2, high BMI, Hx of cervical herniated disc 2011  PAIN:  Are you having pain? Yes: NPRS scale: 3/10. Pain range during week prior to start of PT: 2-8/10. Pain location: L shoulder and neck Pain description: sharp, bone is stuck, ache Aggravating factors: Forcing movement when stuck Relieving factors: Finding the right position for the L arm, cold pack, sling  PRECAUTIONS: None  RED FLAGS: None   WEIGHT BEARING RESTRICTIONS: No  FALLS:  Has patient fallen in last 6 months? No  LIVING ENVIRONMENT: Lives with: lives with their family Lives in: House/apartment Able to access home  OCCUPATION: Unemployed  PLOF: Independent  PATIENT GOALS:No appt scheduled  NEXT MD VISIT:   OBJECTIVE:  Note:  Objective measures were completed at Evaluation unless otherwise noted.  DIAGNOSTIC FINDINGS:  MRI of the left shoulder from 09/04/2023 was independently reviewed and interpreted, showing infraspinatus tendinopathy.  Increased signal near the footprint of the supraspinatus as well.  No rotator cuff tear seen.   PATIENT SURVEYS:  Quick Dash 42/55=75%  COGNITION: Overall cognitive status: Within functional limits for tasks assessed     SENSATION: WFL  POSTURE: Rounded shoulders  UPPER EXTREMITY ROM:   With all AROM L shoulder movements, the pt describe a burning sensation of the GH jt region.  With PROM for flexion and abd, pt reported pain near end range and N/T of her hand Active ROM Right eval Left eval Lt  Shoulder flexion A 120 A 95, P  A 130  Shoulder extension     Shoulder abduction A 132 A 80 A 110  Shoulder adduction     Shoulder internal rotation A T11 A PSIS   Shoulder external rotation A T2 A UT   Elbow flexion     Elbow extension     Wrist flexion     Wrist extension     Wrist ulnar deviation     Wrist radial deviation     Wrist pronation     Wrist supination     (Blank rows = not tested)  UPPER EXTREMITY MMT:  R WNLs 4=-5/5. L Approx 3/5 with give way to pain for all movements MMT Right eval Left eval  Shoulder flexion  3/5  Shoulder extension  3/5  Shoulder abduction  3/5  Shoulder adduction    Shoulder internal rotation  3/5  Shoulder external rotation  3/5  Middle trapezius    Lower trapezius    Elbow flexion    Elbow extension    Wrist flexion    Wrist extension    Wrist ulnar deviation    Wrist radial deviation    Wrist pronation    Wrist supination    Grip strength (lbs)    (Blank rows = not tested)  SHOULDER SPECIAL TESTS: Impingement tests: Unable to assess properly due to L shoulder irritability SLAP lesions:  NT Instability tests:  NT Rotator cuff assessment: Unable to assess properly due to L shoulder irritability Adson's Test:  Positive  JOINT MOBILITY TESTING:  Stable   PALPATION:  TTP to the peri-GH jt and upper trap                                                                                                                             TREATMENT DATE:   [facility] Adult PT Treatment:                                                DATE: 10/11/23 Therapeutic Exercise: UBE 4 min backwards L1 Pectoral stretches doorway single arm 60d, 90d 1 min L scalene stretch x2 30" Shoulder rows 2x15 GTB Shoulder ext 2x15 GTB Shoulder ER 2x10 RTB Wall slides for flexion and scaption x10 each HEP updated  [facility] Adult PT Treatment:                                                DATE: 10/02/23 Therapeutic Exercise: Developed, instructed in, and pt completed therex as noted in HEP  Self Care: Use of heating pad or cold pack for pain management  PATIENT EDUCATION: Education details: Eval findings, POC, HEP, self care  Person educated: Patient Education method: Explanation, Demonstration, Tactile cues, Verbal cues, and Handouts Education comprehension: verbalized understanding, returned demonstration, verbal cues required, and tactile cues required  HOME EXERCISE PROGRAM: Access Code: [email or web address] URL: [email or web address] Date: 10/11/2023 Prepared by: Joellyn Rued  Exercises - Seated Scalenes Stretch (Mirrored)  - 3 x daily - 7 x weekly - 1 sets - 3 reps - 30 hold - Doorway Pec Stretch at 60 Degrees Abduction with Arm Straight (Mirrored)  - 1 x daily - 7 x weekly - 1 sets - 3 reps - 30 hold - Single Arm Doorway Pec Stretch at 90 Degrees Abduction (Mirrored)  - 1 x daily - 7 x weekly - 1 sets - 10 reps - 30 hold - Standing Shoulder Row with Anchored Resistance  - 1 x daily - 7 x weekly - 2 sets - 10 reps - 3 hold - Shoulder Extension with Resistance  - 1 x daily - 7 x weekly - 2 sets - 10 reps - 3 hold - Shoulder External Rotation with Anchored Resistance  - 1 x daily - 7 x weekly - 2 sets - 10 reps - Shoulder  Flexion Wall Slide with Towel  - 1 x daily - 7 x weekly - 1 sets - 10 reps - Shoulder Scaption Wall Slide with Towel  - 1 x daily - 7 x weekly - 1 sets - 10 reps  ASSESSMENT:  CLINICAL IMPRESSION: PT was completed for  therex to address L anterior chest and neck flexibility; and posterior chain and RC strengthening. Pt tolerated the prescribed therex without adverse effects. Reassessed AROM of the L shoulder which was much improved in comparison to the the eval. Pt will continue to benefit from skilled PT to address impairments for improved L shoulder function with minimized pain.    EVAL; Patient is a 43 y.o. female who was seen today for physical therapy evaluation and treatment for M25.512 (ICD-10-CM) - Acute pain of left shoulder. Pt presents reporting a high level of L shoulder irritability with markedly decreased ROM and strength. A differential dx was hard to determine due ti irritability, but the Adson's test was positive indicating TOS. A HEP was initiated. Pt will benefit from skilled PT 2w6 to address impairments to optimize l shoulder/UE function with less pain.   OBJECTIVE IMPAIRMENTS: decreased ROM, decreased strength, impaired UE functional use, obesity, and pain.   ACTIVITY LIMITATIONS: carrying, lifting, sleeping, dressing, reach over head, hygiene/grooming, and caring for others  PARTICIPATION LIMITATIONS: meal prep, cleaning, laundry, driving, and shopping  PERSONAL FACTORS: Fitness, Past/current experiences, Time since onset of injury/illness/exacerbation, and 3+ comorbidities: DM2, high BMI, Hx of cervical herniated disc 2011  are also affecting patient's functional outcome.   REHAB POTENTIAL: Good  CLINICAL DECISION MAKING: Evolving/moderate complexity  EVALUATION COMPLEXITY: Moderate   GOALS:   SHORT TERM GOALS = LTGs   LONG TERM GOALS: Target date: 11/22/23  Pt will be Ind in a final HEP to maintain achieved LOF  Baseline: started Goal status: INITIAL  2.   Pt will voice understanding of measures to assist in pain reduction  Baseline: started Goal status: INITIAL  3.  The L shoulder will demonstrated AROMs with 90% of the R for appropriate functional mobility Baseline: see flow sheet Goal status: INITIAL  4.  The L shoulder will demonstrate at least 4/5 strength for approiate functional use of the L shoulder/UE Baseline: see flow sheet Goal status: INITIAL  5.  The pt will be able to reach to cabinet repeatedly with at least 3#, not limited by pain   Baseline:  Goal status: INITIAL  6.   Pt's Quick Dash score will improve to 50% or less as indication of improved function  Baseline: 42/55= 75% Goal status: INITIAL  PLAN:  PT FREQUENCY: 2x/week  PT DURATION: 6 weeks  PLANNED INTERVENTIONS: 97164- PT Re-evaluation, 97110-Therapeutic exercises, 97530- Therapeutic activity, [id number]- Neuromuscular re-education, 97535- Self Care, 78295- Manual therapy, G0283- Electrical stimulation (unattended), 97035- Ultrasound, 62130- Ionotophoresis 4mg /ml Dexamethasone, Patient/Family education, Taping, Dry Needling, Joint mobilization, Cryotherapy, and Moist heat  PLAN FOR NEXT SESSION: Assess response to HEP; progress therex as indicated; use of modalities, manual therapy; and TPDN as indicated.   Simran Bomkamp MS, PT 10/11/23 10:05 AM

## 2023-10-09 NOTE — Therapy (Deleted)
 OUTPATIENT PHYSICAL THERAPY SHOULDER EVALUATION   Patient Name: Regina Cantu MRN: [id number] DOB:Nov 30, 1980, 43 y.o., female Today's Date: 10/09/2023  END OF SESSION:    Past Medical History:  Diagnosis Date   Anxiety    Cancer (HCC)    ovarian 2001 and 2010   Depression    Essential hypertension 05/04/2022   Herniated disc, cervical 2011   Hypertension    Migraine    Pityriasis rosea    Uncontrolled type 2 diabetes mellitus with hyperglycemia (HCC) 05/04/2022   URI, acute 08/17/2022   Past Surgical History:  Procedure Laterality Date   ABDOMINAL HYSTERECTOMY     COLONOSCOPY WITH PROPOFOL N/A 09/10/2023   Procedure: COLONOSCOPY WITH PROPOFOL;  Surgeon: Midge Minium, MD;  Location: [facility] ENDOSCOPY;  Service: Endoscopy;  Laterality: N/A;   OVARIAN CANCER     Patient Active Problem List   Diagnosis Date Noted   GERD (gastroesophageal reflux disease) 09/14/2023   Constipation 09/10/2023   LOC (loss of consciousness) (HCC) 08/09/2023   Incontinence of feces 08/09/2023   Back pain 03/18/2023   Abnormal Pap smear of cervix 12/27/2022   Tobacco use 08/17/2022   Depression 08/17/2022   Healthcare maintenance 07/04/2022   Migraine 07/04/2022   History of CVA (cerebrovascular accident) 05/04/2022   History of ovarian cancer 05/04/2022   Uncontrolled type 2 diabetes mellitus with hyperglycemia (HCC) 05/04/2022   Essential hypertension 05/04/2022   Malignant (primary) neoplasm, unspecified (HCC) 05/24/2021   Other specified disorders of eustachian tube, bilateral 05/24/2021   Allergy, unspecified, initial encounter 05/24/2021    PCP: Chauncey Mann, DO  REFERRING PROVIDER: London Sheer, MD   REFERRING DIAG: [id number] (ICD-10-CM) - Acute pain of left shoulder   THERAPY DIAG:  No diagnosis found.  Rationale for Evaluation and Treatment: Rehabilitation  ONSET DATE: 2 months ago  SUBJECTIVE:                                                                                                                                                                                       SUBJECTIVE STATEMENT: Pt reports approx 2 months ago she was driving and trying to turn her car by palming the steering wheel with her L hand. She states the L shoulder locked up on her and she had to use her R hand to continue driving. Currently, the L shoulder is not as painful, but feels weak and "disconnected".  Hand dominance: Right  PERTINENT HISTORY: DM2, high BMI, Hx of cervical herniated disc 2011  PAIN:  Are you having pain? Yes: NPRS scale: 3/10. Pain range during week prior to start of PT: 2-8/10. Pain location: L shoulder and neck Pain  description: sharp, bone is stuck, ache Aggravating factors: Forcing movement when stuck Relieving factors: Finding the right position for the L arm, cold pack, sling  PRECAUTIONS: None  RED FLAGS: None   WEIGHT BEARING RESTRICTIONS: No  FALLS:  Has patient fallen in last 6 months? No  LIVING ENVIRONMENT: Lives with: lives with their family Lives in: House/apartment Able to access home  OCCUPATION: Unemployed  PLOF: Independent  PATIENT GOALS:No appt scheduled  NEXT MD VISIT:   OBJECTIVE:  Note: Objective measures were completed at Evaluation unless otherwise noted.  DIAGNOSTIC FINDINGS:  MRI of the left shoulder from 09/04/2023 was independently reviewed and interpreted, showing infraspinatus tendinopathy.  Increased signal near the footprint of the supraspinatus as well.  No rotator cuff tear seen.   PATIENT SURVEYS:  Quick Dash 42/55=75%  COGNITION: Overall cognitive status: Within functional limits for tasks assessed     SENSATION: WFL  POSTURE: Rounded shoulders  UPPER EXTREMITY ROM:   With all AROM L shoulder movements, the pt describe a burning sensation of the GH jt region.  With PROM for flexion and abd, pt reported pain near end range and N/T of her hand Active ROM Right eval Left eval   Shoulder flexion A 120 A 95, P   Shoulder extension    Shoulder abduction A 132 A 80  Shoulder adduction    Shoulder internal rotation A T11 A PSIS  Shoulder external rotation A T2 A UT  Elbow flexion    Elbow extension    Wrist flexion    Wrist extension    Wrist ulnar deviation    Wrist radial deviation    Wrist pronation    Wrist supination    (Blank rows = not tested)  UPPER EXTREMITY MMT:  R WNLs 4=-5/5. L Approx 3/5 with give way to pain for all movements MMT Right eval Left eval  Shoulder flexion  3/5  Shoulder extension  3/5  Shoulder abduction  3/5  Shoulder adduction    Shoulder internal rotation  3/5  Shoulder external rotation  3/5  Middle trapezius    Lower trapezius    Elbow flexion    Elbow extension    Wrist flexion    Wrist extension    Wrist ulnar deviation    Wrist radial deviation    Wrist pronation    Wrist supination    Grip strength (lbs)    (Blank rows = not tested)  SHOULDER SPECIAL TESTS: Impingement tests: Unable to assess properly due to L shoulder irritability SLAP lesions:  NT Instability tests:  NT Rotator cuff assessment: Unable to assess properly due to L shoulder irritability Adson's Test: Positive  JOINT MOBILITY TESTING:  Stable   PALPATION:  TTP to the peri-GH jt and upper trap                                                                                                                             TREATMENT DATE:  [facility] Adult PT Treatment:                                                DATE: 10/09/23 Therapeutic Exercise: *** Manual Therapy: *** Neuromuscular re-ed: *** Therapeutic Activity: *** Modalities: *** Self Care: ***  [facility] Adult PT Treatment:                                                DATE: 10/02/23 Therapeutic Exercise: Developed, instructed in, and pt completed therex as noted in HEP  Self Care: Use of heating pad or cold pack for pain management  PATIENT EDUCATION: Education details: Eval  findings, POC, HEP, self care  Person educated: Patient Education method: Explanation, Demonstration, Tactile cues, Verbal cues, and Handouts Education comprehension: verbalized understanding, returned demonstration, verbal cues required, and tactile cues required  HOME EXERCISE PROGRAM: Access Code: [email or web address] URL: [email or web address] Date: 10/02/2023 Prepared by: Joellyn Rued  Exercises - Seated Scalenes Stretch (Mirrored)  - 3 x daily - 7 x weekly - 1 sets - 3 reps - 20 hold - Standing Shoulder Row with Anchored Resistance  - 1 x daily - 7 x weekly - 2 sets - 10 reps - 3 hold  ASSESSMENT:  CLINICAL IMPRESSION: Patient is a 43 y.o. female who was seen today for physical therapy evaluation and treatment for M25.512 (ICD-10-CM) - Acute pain of left shoulder. Pt presents reporting a high level of L shoulder irritability with markedly decreased ROM and strength. A differential dx was hard to determine due ti irritability, but the Adson's test was positive indicating TOS. A HEP was initiated. Pt will benefit from skilled PT 2w6 to address impairments to optimize l shoulder/UE function with less pain.   OBJECTIVE IMPAIRMENTS: decreased ROM, decreased strength, impaired UE functional use, obesity, and pain.   ACTIVITY LIMITATIONS: carrying, lifting, sleeping, dressing, reach over head, hygiene/grooming, and caring for others  PARTICIPATION LIMITATIONS: meal prep, cleaning, laundry, driving, and shopping  PERSONAL FACTORS: Fitness, Past/current experiences, Time since onset of injury/illness/exacerbation, and 3+ comorbidities: DM2, high BMI, Hx of cervical herniated disc 2011  are also affecting patient's functional outcome.   REHAB POTENTIAL: Good  CLINICAL DECISION MAKING: Evolving/moderate complexity  EVALUATION COMPLEXITY: Moderate   GOALS:   SHORT TERM GOALS = LTGs   LONG TERM GOALS: Target date: 11/22/23  Pt will be Ind in a final HEP to maintain achieved LOF   Baseline: started Goal status: INITIAL  2.  Pt will voice understanding of measures to assist in pain reduction  Baseline: started Goal status: INITIAL  3.  The L shoulder will demonstrated AROMs with 90% of the R for appropriate functional mobility Baseline: see flow sheet Goal status: INITIAL  4.  The L shoulder will demonstrate at least 4/5 strength for approiate functional use of the L shoulder/UE Baseline: see flow sheet Goal status: INITIAL  5.  The pt will be able to reach to cabinet repeatedly with at least 3#, not limited by pain   Baseline:  Goal status: INITIAL  6.   Pt's Quick Dash score will improve to 50% or less as indication of improved function  Baseline: 42/55= 75% Goal status: INITIAL  PLAN:  PT FREQUENCY: 2x/week  PT DURATION: 6  weeks  PLANNED INTERVENTIONS: 97164- PT Re-evaluation, 97110-Therapeutic exercises, 97530- Therapeutic activity, [id number]- Neuromuscular re-education, 97535- Self Care, 96045- Manual therapy, G0283- Electrical stimulation (unattended), 97035- Ultrasound, 40981- Ionotophoresis 4mg /ml Dexamethasone, Patient/Family education, Taping, Dry Needling, Joint mobilization, Cryotherapy, and Moist heat  PLAN FOR NEXT SESSION: Assess response to HEP; progress therex as indicated; use of modalities, manual therapy; and TPDN as indicated.   Allen Ralls MS, PT 10/09/23 7:47 AM

## 2023-10-09 NOTE — Telephone Encounter (Signed)
 Called patient regarding her missed appt today at 8:00 am.  Patient stated she called at 8:15 to report she woke up late.  This will be considered a late cancel/no show. She plans to attend her next appt on 10/11/23 at 8:00 am.   Karie Mainland, PT 10/09/23 11:35 AM Phone: [phone] Fax: [phone]

## 2023-10-11 ENCOUNTER — Ambulatory Visit: Payer: Medicaid Other

## 2023-10-11 ENCOUNTER — Other Ambulatory Visit (HOSPITAL_COMMUNITY): Payer: Self-pay

## 2023-10-11 DIAGNOSIS — M6281 Muscle weakness (generalized): Secondary | ICD-10-CM | POA: Diagnosis not present

## 2023-10-11 DIAGNOSIS — G8929 Other chronic pain: Secondary | ICD-10-CM | POA: Diagnosis not present

## 2023-10-11 DIAGNOSIS — R293 Abnormal posture: Secondary | ICD-10-CM | POA: Diagnosis not present

## 2023-10-11 DIAGNOSIS — M25512 Pain in left shoulder: Secondary | ICD-10-CM | POA: Diagnosis not present

## 2023-10-14 ENCOUNTER — Other Ambulatory Visit (HOSPITAL_COMMUNITY): Payer: Self-pay

## 2023-10-15 ENCOUNTER — Ambulatory Visit: Payer: Medicaid Other | Admitting: Physical Therapy

## 2023-10-15 ENCOUNTER — Ambulatory Visit: Admitting: Physical Therapy

## 2023-10-15 ENCOUNTER — Encounter: Payer: Self-pay | Admitting: Physical Therapy

## 2023-10-15 DIAGNOSIS — M6281 Muscle weakness (generalized): Secondary | ICD-10-CM

## 2023-10-15 DIAGNOSIS — M25512 Pain in left shoulder: Secondary | ICD-10-CM | POA: Diagnosis not present

## 2023-10-15 DIAGNOSIS — R293 Abnormal posture: Secondary | ICD-10-CM | POA: Diagnosis not present

## 2023-10-15 DIAGNOSIS — G8929 Other chronic pain: Secondary | ICD-10-CM | POA: Diagnosis not present

## 2023-10-15 NOTE — Therapy (Signed)
 OUTPATIENT PHYSICAL THERAPY SHOULDER TREATMENT   Patient Name: Regina Cantu MRN: [id number] DOB:04/14/1981, 43 y.o., female Today's Date: 10/15/2023  END OF SESSION:  PT End of Session - 10/15/23 1149     Visit Number 3    Number of Visits 13    Date for PT Re-Evaluation 11/22/23    Authorization Type [place] [employer]ICA[employer] [place]    Authorization - Visit Number 3    Authorization - Number of Visits 27    PT Start Time 1149    PT Stop Time 1230    PT Time Calculation (min) 41 min              Past Medical History:  Diagnosis Date   Anxiety    Cancer (HCC)    ovarian 2001 and 2010   Depression    Essential hypertension 05/04/2022   Herniated disc, cervical 2011   Hypertension    Migraine    Pityriasis rosea    Uncontrolled type 2 diabetes mellitus with hyperglycemia (HCC) 05/04/2022   URI, acute 08/17/2022   Past Surgical History:  Procedure Laterality Date   ABDOMINAL HYSTERECTOMY     COLONOSCOPY WITH PROPOFOL N/A 09/10/2023   Procedure: COLONOSCOPY WITH PROPOFOL;  Surgeon: Midge Minium, MD;  Location: [facility] ENDOSCOPY;  Service: Endoscopy;  Laterality: N/A;   OVARIAN CANCER     Patient Active Problem List   Diagnosis Date Noted   GERD (gastroesophageal reflux disease) 09/14/2023   Constipation 09/10/2023   LOC (loss of consciousness) (HCC) 08/09/2023   Incontinence of feces 08/09/2023   Back pain 03/18/2023   Abnormal Pap smear of cervix 12/27/2022   Tobacco use 08/17/2022   Depression 08/17/2022   Healthcare maintenance 07/04/2022   Migraine 07/04/2022   History of CVA (cerebrovascular accident) 05/04/2022   History of ovarian cancer 05/04/2022   Uncontrolled type 2 diabetes mellitus with hyperglycemia (HCC) 05/04/2022   Essential hypertension 05/04/2022   Malignant (primary) neoplasm, unspecified (HCC) 05/24/2021   Other specified disorders of eustachian tube, bilateral 05/24/2021   Allergy, unspecified, initial encounter 05/24/2021     PCP: Chauncey Mann, DO  REFERRING PROVIDER: London Sheer, MD   REFERRING DIAG: [phone] (ICD-10-CM) - Acute pain of left shoulder   THERAPY DIAG:  Chronic left shoulder pain  Muscle weakness (generalized)  Abnormal posture  Rationale for Evaluation and Treatment: Rehabilitation  ONSET DATE: 2 months ago  SUBJECTIVE:                                                                                                                                                                                      SUBJECTIVE STATEMENT: The shoulder  is annoying. Feels like a boulder on top of my shoulder. I do a lot of leaning on my left arm when I am writing     EVAL: Pt reports approx 2 months ago she was driving and trying to turn her car by palming the steering wheel with her L hand. She states the L shoulder locked up on her and she had to use her R hand to continue driving. Currently, the L shoulder is not as painful, but feels weak and "disconnected".  Hand dominance: Right  PERTINENT HISTORY: DM2, high BMI, Hx of cervical herniated disc 2011  PAIN:  Are you having pain? Yes: NPRS scale:4 /10.  Pain location: L shoulder and neck Pain description: sharp, bone is stuck, ache Aggravating factors: Forcing movement when stuck Relieving factors: Finding the right position for the L arm, cold pack, sling  PRECAUTIONS: None  RED FLAGS: None   WEIGHT BEARING RESTRICTIONS: No  FALLS:  Has patient fallen in last 6 months? No  LIVING ENVIRONMENT: Lives with: lives with their family Lives in: House/apartment Able to access home  OCCUPATION: Unemployed  PLOF: Independent  PATIENT GOALS:No appt scheduled  NEXT MD VISIT:   OBJECTIVE:  Note: Objective measures were completed at Evaluation unless otherwise noted.  DIAGNOSTIC FINDINGS:  MRI of the left shoulder from 09/04/2023 was independently reviewed and interpreted, showing infraspinatus tendinopathy.  Increased signal  near the footprint of the supraspinatus as well.  No rotator cuff tear seen.   PATIENT SURVEYS:  Quick Dash 42/55=75%  COGNITION: Overall cognitive status: Within functional limits for tasks assessed     SENSATION: WFL  POSTURE: Rounded shoulders  UPPER EXTREMITY ROM:   With all AROM L shoulder movements, the pt describe a burning sensation of the GH jt region.  With PROM for flexion and abd, pt reported pain near end range and N/T of her hand Active ROM Right eval Left eval Lt  Shoulder flexion A 120 A 95, P  A 130  Shoulder extension     Shoulder abduction A 132 A 80 A 110  Shoulder adduction     Shoulder internal rotation A T11 A PSIS   Shoulder external rotation A T2 A UT   Elbow flexion     Elbow extension     Wrist flexion     Wrist extension     Wrist ulnar deviation     Wrist radial deviation     Wrist pronation     Wrist supination     (Blank rows = not tested)  UPPER EXTREMITY MMT:  R WNLs 4=-5/5. L Approx 3/5 with give way to pain for all movements MMT Right eval Left eval  Shoulder flexion  3/5  Shoulder extension  3/5  Shoulder abduction  3/5  Shoulder adduction    Shoulder internal rotation  3/5  Shoulder external rotation  3/5  Middle trapezius    Lower trapezius    Elbow flexion    Elbow extension    Wrist flexion    Wrist extension    Wrist ulnar deviation    Wrist radial deviation    Wrist pronation    Wrist supination    Grip strength (lbs)    (Blank rows = not tested)  SHOULDER SPECIAL TESTS: Impingement tests: Unable to assess properly due to L shoulder irritability SLAP lesions:  NT Instability tests:  NT Rotator cuff assessment: Unable to assess properly due to L shoulder irritability Adson's Test: Positive  JOINT MOBILITY TESTING:  Stable  PALPATION:  TTP to the peri-GH jt and upper trap                                                                                                                             TREATMENT  DATE:  [facility] Adult PT Treatment:                                                DATE: 10/15/23  Therapeutic Exercise: Open books x 4 each way  Thoracic extension supine on FM and seated in chair Shoulder ER 2x10 RTB Shoulder ext GTB cues for posture Shoulder Row  GTB cues for posture  DNF 35 seconds   Manual Therapy: TPR left upper trap  Modalities: HMP x 10 minutes concurrent with Self Care  Self Care: Moist heat, posture correction, use of theracane for self TPR      [facility] Adult PT Treatment:                                                DATE: 10/11/23 Therapeutic Exercise: UBE 4 min backwards L1 Pectoral stretches doorway single arm 60d, 90d 1 min L scalene stretch x2 30" Shoulder rows 2x15 GTB Shoulder ext 2x15 GTB Shoulder ER 2x10 RTB Wall slides for flexion and scaption x10 each HEP updated  [facility] Adult PT Treatment:                                                DATE: 10/02/23 Therapeutic Exercise: Developed, instructed in, and pt completed therex as noted in HEP  Self Care: Use of heating pad or cold pack for pain management  PATIENT EDUCATION: Education details: Eval findings, POC, HEP, self care  Person educated: Patient Education method: Explanation, Demonstration, Tactile cues, Verbal cues, and Handouts Education comprehension: verbalized understanding, returned demonstration, verbal cues required, and tactile cues required  HOME EXERCISE PROGRAM: Access Code: [email or web address] URL: [email or web address] Date: 10/11/2023 Prepared by: Joellyn Rued  Exercises - Seated Scalenes Stretch (Mirrored)  - 3 x daily - 7 x weekly - 1 sets - 3 reps - 30 hold - Doorway Pec Stretch at 60 Degrees Abduction with Arm Straight (Mirrored)  - 1 x daily - 7 x weekly - 1 sets - 3 reps - 30 hold - Single Arm Doorway Pec Stretch at 90 Degrees Abduction (Mirrored)  - 1 x daily - 7 x weekly - 1 sets - 10 reps - 30 hold - Standing Shoulder Row with Anchored Resistance  - 1 x daily  - 7 x weekly - 2 sets -  10 reps - 3 hold - Shoulder Extension with Resistance  - 1 x daily - 7 x weekly - 2 sets - 10 reps - 3 hold - Shoulder External Rotation with Anchored Resistance  - 1 x daily - 7 x weekly - 2 sets - 10 reps - Shoulder Flexion Wall Slide with Towel  - 1 x daily - 7 x weekly - 1 sets - 10 reps - Shoulder Scaption Wall Slide with Towel  - 1 x daily - 7 x weekly - 1 sets - 10 reps Added 10/15/23  Sidelying Thoracic Rotation with Open Book  - 1 x daily - 7 x weekly - 1 sets - 10 reps - Seated Thoracic Lumbar Extension with Pectoralis Stretch  - 1 x daily - 7 x weekly - 1 sets - 10 reps  ASSESSMENT:  CLINICAL IMPRESSION: PT was completed for therex to address L anterior chest and neck flexibility; and posterior chain and RC strengthening. Pt tolerated the prescribed therex without adverse effects. Session focused on self Care including options for self TPR, postural corrections when writing, and benefit of heat application to reduce tension. Pt like thoracic rotation and extension stretches which were added to HEP. At end of session, pt reported decreased left upper trap tension and improved neck mobility.  Pt will continue to benefit from skilled PT to address impairments for improved L shoulder function with minimized pain.    EVAL; Patient is a 43 y.o. female who was seen today for physical therapy evaluation and treatment for M25.512 (ICD-10-CM) - Acute pain of left shoulder. Pt presents reporting a high level of L shoulder irritability with markedly decreased ROM and strength. A differential dx was hard to determine due ti irritability, but the Adson's test was positive indicating TOS. A HEP was initiated. Pt will benefit from skilled PT 2w6 to address impairments to optimize l shoulder/UE function with less pain.   OBJECTIVE IMPAIRMENTS: decreased ROM, decreased strength, impaired UE functional use, obesity, and pain.   ACTIVITY LIMITATIONS: carrying, lifting, sleeping,  dressing, reach over head, hygiene/grooming, and caring for others  PARTICIPATION LIMITATIONS: meal prep, cleaning, laundry, driving, and shopping  PERSONAL FACTORS: Fitness, Past/current experiences, Time since onset of injury/illness/exacerbation, and 3+ comorbidities: DM2, high BMI, Hx of cervical herniated disc 2011  are also affecting patient's functional outcome.   REHAB POTENTIAL: Good  CLINICAL DECISION MAKING: Evolving/moderate complexity  EVALUATION COMPLEXITY: Moderate   GOALS:   SHORT TERM GOALS = LTGs   LONG TERM GOALS: Target date: 11/22/23  Pt will be Ind in a final HEP to maintain achieved LOF  Baseline: started Goal status: INITIAL  2.  Pt will voice understanding of measures to assist in pain reduction  Baseline: started Goal status: INITIAL  3.  The L shoulder will demonstrated AROMs with 90% of the R for appropriate functional mobility Baseline: see flow sheet Goal status: INITIAL  4.  The L shoulder will demonstrate at least 4/5 strength for approiate functional use of the L shoulder/UE Baseline: see flow sheet Goal status: INITIAL  5.  The pt will be able to reach to cabinet repeatedly with at least 3#, not limited by pain   Baseline:  Goal status: INITIAL  6.   Pt's Quick Dash score will improve to 50% or less as indication of improved function  Baseline: 42/55= 75% Goal status: INITIAL  PLAN:  PT FREQUENCY: 2x/week  PT DURATION: 6 weeks  PLANNED INTERVENTIONS: 97164- PT Re-evaluation, 97110-Therapeutic exercises, 97530- Therapeutic activity, 97112-  Neuromuscular re-education, [phone]- Self Care, 60454- Manual therapy, G0283- Electrical stimulation (unattended), 97035- Ultrasound, [phone]- Ionotophoresis 4mg /ml Dexamethasone, Patient/Family education, Taping, Dry Needling, Joint mobilization, Cryotherapy, and Moist heat  PLAN FOR NEXT SESSION: Assess response to HEP; progress therex as indicated; use of modalities, manual therapy; and TPDN as  indicate  (FEARFUL OF NEEDLES)  Jannette Spanner, PTA 10/15/23 12:45 PM Phone: [phone] Fax: [phone]

## 2023-10-16 ENCOUNTER — Other Ambulatory Visit (HOSPITAL_COMMUNITY): Payer: Self-pay

## 2023-10-17 NOTE — Therapy (Signed)
 OUTPATIENT PHYSICAL THERAPY SHOULDER TREATMENT   Patient Name: Regina Cantu MRN: [id number] DOB:02-Jun-1981, 43 y.o., female Today's Date: 10/18/2023  END OF SESSION:  PT End of Session - 10/18/23 0932     Visit Number 4    Number of Visits 13    Date for PT Re-Evaluation 11/22/23    Authorization Type [place] MEDICAID [employer] OF [place]    Authorization - Visit Number 4    Authorization - Number of Visits 27    PT Start Time 0845    PT Stop Time 0930    PT Time Calculation (min) 45 min    Activity Tolerance Patient tolerated treatment well    Behavior During Therapy [facility] for tasks assessed/performed               Past Medical History:  Diagnosis Date   Anxiety    Cancer (HCC)    ovarian 2001 and 2010   Depression    Essential hypertension 05/04/2022   Herniated disc, cervical 2011   Hypertension    Migraine    Pityriasis rosea    Uncontrolled type 2 diabetes mellitus with hyperglycemia (HCC) 05/04/2022   URI, acute 08/17/2022   Past Surgical History:  Procedure Laterality Date   ABDOMINAL HYSTERECTOMY     COLONOSCOPY WITH PROPOFOL N/A 09/10/2023   Procedure: COLONOSCOPY WITH PROPOFOL;  Surgeon: Midge Minium, MD;  Location: [facility] ENDOSCOPY;  Service: Endoscopy;  Laterality: N/A;   OVARIAN CANCER     Patient Active Problem List   Diagnosis Date Noted   GERD (gastroesophageal reflux disease) 09/14/2023   Constipation 09/10/2023   LOC (loss of consciousness) (HCC) 08/09/2023   Incontinence of feces 08/09/2023   Back pain 03/18/2023   Abnormal Pap smear of cervix 12/27/2022   Tobacco use 08/17/2022   Depression 08/17/2022   Healthcare maintenance 07/04/2022   Migraine 07/04/2022   History of CVA (cerebrovascular accident) 05/04/2022   History of ovarian cancer 05/04/2022   Uncontrolled type 2 diabetes mellitus with hyperglycemia (HCC) 05/04/2022   Essential hypertension 05/04/2022   Malignant (primary) neoplasm, unspecified (HCC) 05/24/2021   Other  specified disorders of eustachian tube, bilateral 05/24/2021   Allergy, unspecified, initial encounter 05/24/2021    PCP: Chauncey Mann, DO  REFERRING PROVIDER: London Sheer, MD   REFERRING DIAG: [phone] (ICD-10-CM) - Acute pain of left shoulder   THERAPY DIAG:  Chronic left shoulder pain  Muscle weakness (generalized)  Abnormal posture  Rationale for Evaluation and Treatment: Rehabilitation  ONSET DATE: 2 months ago  SUBJECTIVE:  SUBJECTIVE STATEMENT: Pt reports liking the open book stretch  EVAL: Pt reports approx 2 months ago she was driving and trying to turn her car by palming the steering wheel with her L hand. She states the L shoulder locked up on her and she had to use her R hand to continue driving. Currently, the L shoulder is not as painful, but feels weak and "disconnected".  Hand dominance: Right  PERTINENT HISTORY: DM2, high BMI, Hx of cervical herniated disc 2011  PAIN:  Are you having pain? Yes: NPRS scale:2-3/10.  Pain location: L shoulder and neck Pain description: sharp, bone is stuck, ache Aggravating factors: Forcing movement when stuck Relieving factors: Finding the right position for the L arm, cold pack, sling  PRECAUTIONS: None  RED FLAGS: None   WEIGHT BEARING RESTRICTIONS: No  FALLS:  Has patient fallen in last 6 months? No  LIVING ENVIRONMENT: Lives with: lives with their family Lives in: House/apartment Able to access home  OCCUPATION: Unemployed  PLOF: Independent  PATIENT GOALS:No appt scheduled  NEXT MD VISIT:   OBJECTIVE:  Note: Objective measures were completed at Evaluation unless otherwise noted.  DIAGNOSTIC FINDINGS:  MRI of the left shoulder from 09/04/2023 was independently reviewed and interpreted, showing infraspinatus  tendinopathy.  Increased signal near the footprint of the supraspinatus as well.  No rotator cuff tear seen.   PATIENT SURVEYS:  Quick Dash 42/55=75%  COGNITION: Overall cognitive status: Within functional limits for tasks assessed     SENSATION: WFL  POSTURE: Rounded shoulders  UPPER EXTREMITY ROM:   With all AROM L shoulder movements, the pt describe a burning sensation of the GH jt region.  With PROM for flexion and abd, pt reported pain near end range and N/T of her hand Active ROM Right eval Left eval Lt  Shoulder flexion A 120 A 95, P  A 130  Shoulder extension     Shoulder abduction A 132 A 80 A 110  Shoulder adduction     Shoulder internal rotation A T11 A PSIS   Shoulder external rotation A T2 A UT   Elbow flexion     Elbow extension     Wrist flexion     Wrist extension     Wrist ulnar deviation     Wrist radial deviation     Wrist pronation     Wrist supination     (Blank rows = not tested)  UPPER EXTREMITY MMT:  R WNLs 4=-5/5. L Approx 3/5 with give way to pain for all movements MMT Right eval Left eval  Shoulder flexion  3/5  Shoulder extension  3/5  Shoulder abduction  3/5  Shoulder adduction    Shoulder internal rotation  3/5  Shoulder external rotation  3/5  Middle trapezius    Lower trapezius    Elbow flexion    Elbow extension    Wrist flexion    Wrist extension    Wrist ulnar deviation    Wrist radial deviation    Wrist pronation    Wrist supination    Grip strength (lbs)    (Blank rows = not tested)  SHOULDER SPECIAL TESTS: Impingement tests: Unable to assess properly due to L shoulder irritability SLAP lesions:  NT Instability tests:  NT Rotator cuff assessment: Unable to assess properly due to L shoulder irritability Adson's Test: Positive  JOINT MOBILITY TESTING:  Stable   PALPATION:  TTP to the peri-GH jt and upper trap  TREATMENT DATE:  [facility] Adult PT Treatment:                                                DATE: 10/18/22 Therapeutic Exercise: Open books x 5 each way  Thoracic extension FR, seated in chair Shoulder ER 2x10 RTB Shoulder ext 2x10 GTB  Shoulder Row 2x10 GTB  L scalene stretch x3 30" Manual Therapy: TPR left upper trap, levator and mid scap muscles Self Care: Use of tennis ball for mid back massage  [facility] Adult PT Treatment:                                                DATE: 10/15/23  Therapeutic Exercise: Open books x 4 each way  Thoracic extension supine on FM and seated in chair Shoulder ER 2x10 RTB Shoulder ext GTB cues for posture Shoulder Row  GTB cues for posture  DNF 35 seconds   Manual Therapy: TPR left upper trap  Modalities: HMP x 10 minutes concurrent with Self Care  Self Care: Moist heat, posture correction, use of theracane for self TPR      [facility] Adult PT Treatment:                                                DATE: 10/11/23 Therapeutic Exercise: UBE 4 min backwards L1 Pectoral stretches doorway single arm 60d, 90d 1 min L scalene stretch x2 30" Shoulder rows 2x15 GTB Shoulder ext 2x15 GTB Shoulder ER 2x10 RTB Wall slides for flexion and scaption x10 each HEP updated  [facility] Adult PT Treatment:                                                DATE: 10/02/23 Therapeutic Exercise: Developed, instructed in, and pt completed therex as noted in HEP  Self Care: Use of heating pad or cold pack for pain management  PATIENT EDUCATION: Education details: Eval findings, POC, HEP, self care  Person educated: Patient Education method: Explanation, Demonstration, Tactile cues, Verbal cues, and Handouts Education comprehension: verbalized understanding, returned demonstration, verbal cues required, and tactile cues required  HOME EXERCISE PROGRAM: Access Code: [email or web address] URL: [email or web address] Date: 10/11/2023 Prepared by: Joellyn Rued  Exercises - Seated Scalenes Stretch (Mirrored)  - 3 x daily - 7 x weekly - 1 sets - 3 reps - 30 hold - Doorway Pec Stretch at 60 Degrees Abduction with Arm Straight (Mirrored)  - 1 x daily - 7 x weekly - 1 sets - 3 reps - 30 hold - Single Arm Doorway Pec Stretch at 90 Degrees Abduction (Mirrored)  - 1 x daily - 7 x weekly - 1 sets - 10 reps - 30 hold - Standing Shoulder Row with Anchored Resistance  - 1 x daily - 7 x weekly - 2 sets - 10 reps - 3 hold - Shoulder Extension with Resistance  - 1 x daily - 7 x weekly - 2 sets - 10 reps -  3 hold - Shoulder External Rotation with Anchored Resistance  - 1 x daily - 7 x weekly - 2 sets - 10 reps - Shoulder Flexion Wall Slide with Towel  - 1 x daily - 7 x weekly - 1 sets - 10 reps - Shoulder Scaption Wall Slide with Towel  - 1 x daily - 7 x weekly - 1 sets - 10 reps Added 10/15/23  Sidelying Thoracic Rotation with Open Book  - 1 x daily - 7 x weekly - 1 sets - 10 reps - Seated Thoracic Lumbar Extension with Pectoralis Stretch  - 1 x daily - 7 x weekly - 1 sets - 10 reps  ASSESSMENT:  CLINICAL IMPRESSION: PT was continued for therex to address L anterior chest and neck flexibility; and posterior chain and RC strengthening. Pt tolerated the prescribed therex without adverse effects. Pt was able to self cue to keep shoulders "down" during therex. Pt voices good understanding of self care options to minimize symptoms. Pt is responding positively to PT intervention. Pt tolerated PT today without adverse effects   EVAL; Patient is a 43 y.o. female who was seen today for physical therapy evaluation and treatment for M25.512 (ICD-10-CM) - Acute pain of left shoulder. Pt presents reporting a high level of L shoulder irritability with markedly decreased ROM and strength. A differential dx was hard to determine due ti irritability, but the Adson's test was positive indicating TOS. A HEP was initiated. Pt will benefit from skilled PT 2w6 to address impairments  to optimize l shoulder/UE function with less pain.   OBJECTIVE IMPAIRMENTS: decreased ROM, decreased strength, impaired UE functional use, obesity, and pain.   ACTIVITY LIMITATIONS: carrying, lifting, sleeping, dressing, reach over head, hygiene/grooming, and caring for others  PARTICIPATION LIMITATIONS: meal prep, cleaning, laundry, driving, and shopping  PERSONAL FACTORS: Fitness, Past/current experiences, Time since onset of injury/illness/exacerbation, and 3+ comorbidities: DM2, high BMI, Hx of cervical herniated disc 2011  are also affecting patient's functional outcome.   REHAB POTENTIAL: Good  CLINICAL DECISION MAKING: Evolving/moderate complexity  EVALUATION COMPLEXITY: Moderate   GOALS:   SHORT TERM GOALS = LTGs   LONG TERM GOALS: Target date: 11/22/23  Pt will be Ind in a final HEP to maintain achieved LOF  Baseline: started 10/18/23: Proper tech with VC Goal status: ONGOING  2.  Pt will voice understanding of measures to assist in pain reduction  Baseline: started 10/18/23: Stretches, massage gun, heat Goal status: ONGOING  3.  The L shoulder will demonstrated AROMs with 90% of the R for appropriate functional mobility Baseline: see flow sheet Goal status: INITIAL  4.  The L shoulder will demonstrate at least 4/5 strength for approiate functional use of the L shoulder/UE Baseline: see flow sheet Goal status: INITIAL  5.  The pt will be able to reach to cabinet repeatedly with at least 3#, not limited by pain   Baseline:  Goal status: INITIAL  6.   Pt's Quick Dash score will improve to 50% or less as indication of improved function  Baseline: 42/55= 75% Goal status: INITIAL  PLAN:  PT FREQUENCY: 2x/week  PT DURATION: 6 weeks  PLANNED INTERVENTIONS: 97164- PT Re-evaluation, 97110-Therapeutic exercises, 97530- Therapeutic activity, [id number]- Neuromuscular re-education, 97535- Self Care, 52841- Manual therapy, G0283- Electrical stimulation (unattended),  97035- Ultrasound, 32440- Ionotophoresis 4mg /ml Dexamethasone, Patient/Family education, Taping, Dry Needling, Joint mobilization, Cryotherapy, and Moist heat  PLAN FOR NEXT SESSION: Assess response to HEP; progress therex as indicated; use of modalities, manual therapy; and  TPDN as indicate  (FEARFUL OF NEEDLES)  Joellyn Rued MS, PT 10/18/23 9:35 AM

## 2023-10-18 ENCOUNTER — Other Ambulatory Visit (HOSPITAL_COMMUNITY): Payer: Self-pay

## 2023-10-18 ENCOUNTER — Ambulatory Visit: Payer: Medicaid Other

## 2023-10-18 DIAGNOSIS — G8929 Other chronic pain: Secondary | ICD-10-CM

## 2023-10-18 DIAGNOSIS — R293 Abnormal posture: Secondary | ICD-10-CM

## 2023-10-18 DIAGNOSIS — M25512 Pain in left shoulder: Secondary | ICD-10-CM | POA: Diagnosis not present

## 2023-10-18 DIAGNOSIS — M6281 Muscle weakness (generalized): Secondary | ICD-10-CM

## 2023-10-22 ENCOUNTER — Ambulatory Visit: Payer: Medicaid Other

## 2023-10-22 DIAGNOSIS — R293 Abnormal posture: Secondary | ICD-10-CM

## 2023-10-22 DIAGNOSIS — M6281 Muscle weakness (generalized): Secondary | ICD-10-CM

## 2023-10-22 DIAGNOSIS — G8929 Other chronic pain: Secondary | ICD-10-CM

## 2023-10-22 DIAGNOSIS — M25512 Pain in left shoulder: Secondary | ICD-10-CM | POA: Diagnosis not present

## 2023-10-22 NOTE — Therapy (Signed)
 OUTPATIENT PHYSICAL THERAPY SHOULDER TREATMENT   Patient Name: Regina Cantu MRN: [id number] DOB:04-11-1981, 43 y.o., female Today's Date: 10/22/2023  END OF SESSION:  PT End of Session - 10/22/23 0856     Visit Number 5    Number of Visits 13    Date for PT Re-Evaluation 11/22/23    Authorization Type [place] MEDICAID [employer] CARIT[employer] OF [place]    Authorization - Visit Number 5    Authorization - Number of Visits 27    PT Start Time [phone]    PT Stop Time 0930    PT Time Calculation (min) 40 min    Activity Tolerance Patient tolerated treatment well    Behavior During Therapy [facility] for tasks assessed/performed                Past Medical History:  Diagnosis Date   Anxiety    Cancer (HCC)    ovarian 2001 and 2010   Depression    Essential hypertension 05/04/2022   Herniated disc, cervical 2011   Hypertension    Migraine    Pityriasis rosea    Uncontrolled type 2 diabetes mellitus with hyperglycemia (HCC) 05/04/2022   URI, acute 08/17/2022   Past Surgical History:  Procedure Laterality Date   ABDOMINAL HYSTERECTOMY     COLONOSCOPY WITH PROPOFOL N/A 09/10/2023   Procedure: COLONOSCOPY WITH PROPOFOL;  Surgeon: Midge Minium, MD;  Location: [facility] ENDOSCOPY;  Service: Endoscopy;  Laterality: N/A;   OVARIAN CANCER     Patient Active Problem List   Diagnosis Date Noted   GERD (gastroesophageal reflux disease) 09/14/2023   Constipation 09/10/2023   LOC (loss of consciousness) (HCC) 08/09/2023   Incontinence of feces 08/09/2023   Back pain 03/18/2023   Abnormal Pap smear of cervix 12/27/2022   Tobacco use 08/17/2022   Depression 08/17/2022   Healthcare maintenance 07/04/2022   Migraine 07/04/2022   History of CVA (cerebrovascular accident) 05/04/2022   History of ovarian cancer 05/04/2022   Uncontrolled type 2 diabetes mellitus with hyperglycemia (HCC) 05/04/2022   Essential hypertension 05/04/2022   Malignant (primary) neoplasm, unspecified (HCC) 05/24/2021   Other  specified disorders of eustachian tube, bilateral 05/24/2021   Allergy, unspecified, initial encounter 05/24/2021    PCP: Chauncey Mann, DO  REFERRING PROVIDER: London Sheer, MD   REFERRING DIAG: [phone] (ICD-10-CM) - Acute pain of left shoulder   THERAPY DIAG:  Chronic left shoulder pain  Muscle weakness (generalized)  Abnormal posture  Rationale for Evaluation and Treatment: Rehabilitation  ONSET DATE: 2 months ago  SUBJECTIVE:  SUBJECTIVE STATEMENT: Pt reports L shoulder is feeling better. The knot area is not feeling as tight.Marland Kitchen  EVAL: Pt reports approx 2 months ago she was driving and trying to turn her car by palming the steering wheel with her L hand. She states the L shoulder locked up on her and she had to use her R hand to continue driving. Currently, the L shoulder is not as painful, but feels weak and "disconnected".  Hand dominance: Right  PERTINENT HISTORY: DM2, high BMI, Hx of cervical herniated disc 2011  PAIN:  Are you having pain? Yes: NPRS scale:2-3/10. Pain range during week prior to start of PT: 2-8/10.  Pain location: L shoulder and neck Pain description: sharp, bone is stuck, ache Aggravating factors: Forcing movement when stuck Relieving factors: Finding the right position for the L arm, cold pack, sling  PRECAUTIONS: None  RED FLAGS: None   WEIGHT BEARING RESTRICTIONS: No  FALLS:  Has patient fallen in last 6 months? No  LIVING ENVIRONMENT: Lives with: lives with their family Lives in: House/apartment Able to access home  OCCUPATION: Unemployed  PLOF: Independent  PATIENT GOALS:No appt scheduled  NEXT MD VISIT:   OBJECTIVE:  Note: Objective measures were completed at Evaluation unless otherwise noted.  DIAGNOSTIC FINDINGS:  MRI of the left  shoulder from 09/04/2023 was independently reviewed and interpreted, showing infraspinatus tendinopathy.  Increased signal near the footprint of the supraspinatus as well.  No rotator cuff tear seen.   PATIENT SURVEYS:  Quick Dash 42/55=75%  COGNITION: Overall cognitive status: Within functional limits for tasks assessed     SENSATION: WFL  POSTURE: Rounded shoulders  UPPER EXTREMITY ROM:   With all AROM L shoulder movements, the pt describe a burning sensation of the GH jt region.  With PROM for flexion and abd, pt reported pain near end range and N/T of her hand Active ROM Right eval Left eval Lt Lt 10/22/23  Shoulder flexion A 120 A 95, P  A 130 A130  Shoulder extension      Shoulder abduction A 132 A 80 A 110 A120  Shoulder adduction      Shoulder internal rotation A T11 A PSIS    Shoulder external rotation A T2 A UT    Elbow flexion      Elbow extension      Wrist flexion      Wrist extension      Wrist ulnar deviation      Wrist radial deviation      Wrist pronation      Wrist supination      (Blank rows = not tested)  UPPER EXTREMITY MMT:  R WNLs 4=-5/5. L Approx 3/5 with give way to pain for all movements MMT Right eval Left eval Lt 10/22/23  Shoulder flexion  3/5 4/5  Shoulder extension  3/5 4/5  Shoulder abduction  3/5 4/5  Shoulder adduction     Shoulder internal rotation  3/5 4/5  Shoulder external rotation  3/5 4/5  Middle trapezius     Lower trapezius     Elbow flexion     Elbow extension     Wrist flexion     Wrist extension     Wrist ulnar deviation     Wrist radial deviation     Wrist pronation     Wrist supination     Grip strength (lbs)     (Blank rows = not tested)  SHOULDER SPECIAL TESTS: Impingement tests: Unable to assess properly due  to L shoulder irritability SLAP lesions:  NT Instability tests:  NT Rotator cuff assessment: Unable to assess properly due to L shoulder irritability Adson's Test: Positive  JOINT MOBILITY TESTING:   Stable   PALPATION:  TTP to the peri-GH jt and upper trap                                                                                                                             TREATMENT DATE:  [facility] Adult PT Treatment:                                                DATE: 10/22/23 Therapeutic Exercise: UBE 1 min FWD, 3 mins BWD L1 Open books x 5 each way  L scalene stretch x3 30" L doorway at 60d and 90d Shoulder ER 2x10 RTB Shoulder ext 2x10 GTB  Shoulder Row 2x10 GTB  MMT ROM   [facility] Adult PT Treatment:                                                DATE: 10/18/22 Therapeutic Exercise: Open books x 5 each way  Thoracic extension FR, seated in chair Shoulder ER 2x10 RTB Shoulder ext 2x10 GTB  Shoulder Row 2x10 GTB  L scalene stretch x3 30" Manual Therapy: TPR left upper trap, levator and mid scap muscles Self Care: Use of tennis ball for mid back massage  [facility] Adult PT Treatment:                                                DATE: 10/15/23  Therapeutic Exercise: Open books x 4 each way  Thoracic extension supine on FM and seated in chair Shoulder ER 2x10 RTB Shoulder ext GTB cues for posture Shoulder Row  GTB cues for posture  DNF 35 seconds   Manual Therapy: TPR left upper trap  Modalities: HMP x 10 minutes concurrent with Self Care  Self Care: Moist heat, posture correction, use of theracane for self TPR     PATIENT EDUCATION: Education details: Eval findings, POC, HEP, self care  Person educated: Patient Education method: Explanation, Demonstration, Tactile cues, Verbal cues, and Handouts Education comprehension: verbalized understanding, returned demonstration, verbal cues required, and tactile cues required  HOME EXERCISE PROGRAM: Access Code: [email or web address] URL: [email or web address] Date: 10/11/2023 Prepared by: Joellyn Rued  Exercises - Seated Scalenes Stretch (Mirrored)  - 3 x daily - 7 x weekly - 1 sets - 3 reps - 30 hold - Doorway  Pec Stretch at 60 Degrees Abduction with Arm Straight (Mirrored)  -  1 x daily - 7 x weekly - 1 sets - 3 reps - 30 hold - Single Arm Doorway Pec Stretch at 90 Degrees Abduction (Mirrored)  - 1 x daily - 7 x weekly - 1 sets - 10 reps - 30 hold - Standing Shoulder Row with Anchored Resistance  - 1 x daily - 7 x weekly - 2 sets - 10 reps - 3 hold - Shoulder Extension with Resistance  - 1 x daily - 7 x weekly - 2 sets - 10 reps - 3 hold - Shoulder External Rotation with Anchored Resistance  - 1 x daily - 7 x weekly - 2 sets - 10 reps - Shoulder Flexion Wall Slide with Towel  - 1 x daily - 7 x weekly - 1 sets - 10 reps - Shoulder Scaption Wall Slide with Towel  - 1 x daily - 7 x weekly - 1 sets - 10 reps Added 10/15/23  Sidelying Thoracic Rotation with Open Book  - 1 x daily - 7 x weekly - 1 sets - 10 reps - Seated Thoracic Lumbar Extension with Pectoralis Stretch  - 1 x daily - 7 x weekly - 1 sets - 10 reps  ASSESSMENT:  CLINICAL IMPRESSION: Adson's test is positive for loss of pulse and N/T in L hand, Reverse Adson's is positive for N/T only. AROM, strength, and pain are  for the L shoulder is improved. Continued for therex to address L anterior chest and neck flexibility; and posterior chain and RC strengthening. Pt tolerated the prescribed therex without adverse effects. Pt will continue to benefit from skilled PT to address impairments for improved function. Will provided pt ed re: proper posture and computer set the next PT session.  EVAL; Patient is a 43 y.o. female who was seen today for physical therapy evaluation and treatment for M25.512 (ICD-10-CM) - Acute pain of left shoulder. Pt presents reporting a high level of L shoulder irritability with markedly decreased ROM and strength. A differential dx was hard to determine due ti irritability, but the Adson's test was positive indicating TOS. A HEP was initiated. Pt will benefit from skilled PT 2w6 to address impairments to optimize l shoulder/UE  function with less pain.   OBJECTIVE IMPAIRMENTS: decreased ROM, decreased strength, impaired UE functional use, obesity, and pain.   ACTIVITY LIMITATIONS: carrying, lifting, sleeping, dressing, reach over head, hygiene/grooming, and caring for others  PARTICIPATION LIMITATIONS: meal prep, cleaning, laundry, driving, and shopping  PERSONAL FACTORS: Fitness, Past/current experiences, Time since onset of injury/illness/exacerbation, and 3+ comorbidities: DM2, high BMI, Hx of cervical herniated disc 2011  are also affecting patient's functional outcome.   REHAB POTENTIAL: Good  CLINICAL DECISION MAKING: Evolving/moderate complexity  EVALUATION COMPLEXITY: Moderate   GOALS:   SHORT TERM GOALS = LTGs   LONG TERM GOALS: Target date: 11/22/23  Pt will be Ind in a final HEP to maintain achieved LOF  Baseline: started 10/18/23: Proper tech with VC Goal status: ONGOING  2.  Pt will voice understanding of measures to assist in pain reduction  Baseline: started 10/18/23: Stretches, massage gun, heat Goal status: ONGOING  3.  The L shoulder will demonstrated AROMs with 90% of the R for appropriate functional mobility Baseline: see flow sheet Goal status: INITIAL  4.  The L shoulder will demonstrate at least 4/5 strength for approiate functional use of the L shoulder/UE Baseline: see flow sheet Goal status: INITIAL  5.  The pt will be able to reach to cabinet repeatedly with at  least 3#, not limited by pain   Baseline:  Goal status: INITIAL  6.   Pt's Quick Dash score will improve to 50% or less as indication of improved function  Baseline: 42/55= 75% Goal status: INITIAL  PLAN:  PT FREQUENCY: 2x/week  PT DURATION: 6 weeks  PLANNED INTERVENTIONS: 97164- PT Re-evaluation, 97110-Therapeutic exercises, 97530- Therapeutic activity, 97112- Neuromuscular re-education, 97535- Self Care, 13244- Manual therapy, G0283- Electrical stimulation (unattended), 97035- Ultrasound, 01027-  Ionotophoresis 4mg /ml Dexamethasone, Patient/Family education, Taping, Dry Needling, Joint mobilization, Cryotherapy, and Moist heat  PLAN FOR NEXT SESSION: Assess response to HEP; progress therex as indicated; use of modalities, manual therapy; and TPDN as indicate  (FEARFUL OF NEEDLES)  Joellyn Rued MS, PT 10/22/23 9:35 AM

## 2023-10-25 ENCOUNTER — Ambulatory Visit: Payer: Medicaid Other

## 2023-10-30 ENCOUNTER — Other Ambulatory Visit: Payer: Self-pay | Admitting: Student

## 2023-10-30 ENCOUNTER — Other Ambulatory Visit: Payer: Self-pay | Admitting: Internal Medicine

## 2023-10-30 ENCOUNTER — Other Ambulatory Visit (HOSPITAL_COMMUNITY): Payer: Self-pay

## 2023-10-30 ENCOUNTER — Other Ambulatory Visit: Payer: Self-pay

## 2023-10-30 ENCOUNTER — Other Ambulatory Visit: Payer: Self-pay | Admitting: Orthopedic Surgery

## 2023-10-30 DIAGNOSIS — G43719 Chronic migraine without aura, intractable, without status migrainosus: Secondary | ICD-10-CM

## 2023-10-30 MED ORDER — METHOCARBAMOL 500 MG PO TABS
500.0000 mg | ORAL_TABLET | Freq: Three times a day (TID) | ORAL | 0 refills | Status: DC | PRN
  Filled 2023-10-30: qty 60, 20d supply, fill #0

## 2023-10-31 ENCOUNTER — Other Ambulatory Visit (HOSPITAL_COMMUNITY): Payer: Self-pay

## 2023-10-31 ENCOUNTER — Other Ambulatory Visit: Payer: Self-pay

## 2023-10-31 MED ORDER — LINACLOTIDE 145 MCG PO CAPS
145.0000 ug | ORAL_CAPSULE | Freq: Every day | ORAL | 5 refills | Status: DC
  Filled 2023-10-31: qty 30, 30d supply, fill #0
  Filled 2023-12-11: qty 30, 30d supply, fill #1
  Filled 2024-01-15: qty 30, 30d supply, fill #2
  Filled 2024-02-09: qty 30, 30d supply, fill #3
  Filled 2024-03-17: qty 30, 30d supply, fill #4

## 2023-10-31 MED ORDER — TRULICITY 0.75 MG/0.5ML ~~LOC~~ SOAJ
0.7500 mg | SUBCUTANEOUS | 2 refills | Status: DC
  Filled 2023-10-31: qty 2, 28d supply, fill #0
  Filled 2023-12-05: qty 2, 28d supply, fill #1
  Filled 2024-01-05: qty 2, 28d supply, fill #2

## 2023-10-31 MED FILL — Ondansetron HCl Tab 4 MG: ORAL | 5 days supply | Qty: 15 | Fill #0 | Status: AC

## 2023-10-31 MED FILL — Topiramate Tab 25 MG: ORAL | 30 days supply | Qty: 60 | Fill #0 | Status: CN

## 2023-10-31 NOTE — Telephone Encounter (Signed)
 Medication sent to pharmacy

## 2023-11-05 NOTE — Therapy (Signed)
 OUTPATIENT PHYSICAL THERAPY SHOULDER TREATMENT   Patient Name: Regina Cantu MRN: [id number] DOB:1981/06/21, 43 y.o., female Today's Date: 11/08/2023  END OF SESSION:  PT End of Session - 11/08/23 1155     Visit Number 6    Number of Visits 13    Date for PT Re-Evaluation 11/22/23    Authorization Type [place] MEDICA[employer] [place]    Authorization - Visit Number 6    Authorization - Number of Visits 27    PT Start Time 1152    PT Stop Time 1230    PT Time Calculation (min) 38 min    Activity Tolerance Patient tolerated treatment well    Behavior During Therapy [facility] for tasks assessed/performed                 Past Medical History:  Diagnosis Date   Anxiety    Cancer (HCC)    ovarian 2001 and 2010   Depression    Essential hypertension 05/04/2022   Herniated disc, cervical 2011   Hypertension    Migraine    Pityriasis rosea    Uncontrolled type 2 diabetes mellitus with hyperglycemia (HCC) 05/04/2022   URI, acute 08/17/2022   Past Surgical History:  Procedure Laterality Date   ABDOMINAL HYSTERECTOMY     COLONOSCOPY WITH PROPOFOL N/A 09/10/2023   Procedure: COLONOSCOPY WITH PROPOFOL;  Surgeon: Midge Minium, MD;  Location: [facility] ENDOSCOPY;  Service: Endoscopy;  Laterality: N/A;   OVARIAN CANCER     Patient Active Problem List   Diagnosis Date Noted   GERD (gastroesophageal reflux disease) 09/14/2023   Constipation 09/10/2023   LOC (loss of consciousness) (HCC) 08/09/2023   Incontinence of feces 08/09/2023   Back pain 03/18/2023   Abnormal Pap smear of cervix 12/27/2022   Tobacco use 08/17/2022   Depression 08/17/2022   Healthcare maintenance 07/04/2022   Migraine 07/04/2022   History of CVA (cerebrovascular accident) 05/04/2022   History of ovarian cancer 05/04/2022   Uncontrolled type 2 diabetes mellitus with hyperglycemia (HCC) 05/04/2022   Essential hypertension 05/04/2022   Malignant (primary) neoplasm, unspecified (HCC) 05/24/2021    Other specified disorders of eustachian tube, bilateral 05/24/2021   Allergy, unspecified, initial encounter 05/24/2021    PCP: Chauncey Mann, DO  REFERRING PROVIDER: London Sheer, MD   REFERRING DIAG: [phone] (ICD-10-CM) - Acute pain of left shoulder   THERAPY DIAG:  Chronic left shoulder pain  Muscle weakness (generalized)  Abnormal posture  Rationale for Evaluation and Treatment: Rehabilitation  ONSET DATE: 2 months ago  SUBJECTIVE:  SUBJECTIVE STATEMENT: Pt reports in certain positions her L shoulder feels like it gets stuck. The L arm feels heavy. Heat helps to relax it. N/T of the L arm is less frequent.  EVAL: Pt reports approx 2 months ago she was driving and trying to turn her car by palming the steering wheel with her L hand. She states the L shoulder locked up on her and she had to use her R hand to continue driving. Currently, the L shoulder is not as painful, but feels weak and "disconnected".  Hand dominance: Right  PERTINENT HISTORY: DM2, high BMI, Hx of cervical herniated disc 2011  PAIN:  Are you having pain? Yes: NPRS scale:0/10. Sensation of being heavy.Pain range during week prior to start of PT: 2-8/10.  Pain location: L shoulder and neck Pain description: sharp, bone is stuck, ache Aggravating factors: Forcing movement when stuck Relieving factors: Finding the right position for the L arm, cold pack, sling  PRECAUTIONS: None  RED FLAGS: None   WEIGHT BEARING RESTRICTIONS: No  FALLS:  Has patient fallen in last 6 months? No  LIVING ENVIRONMENT: Lives with: lives with their family Lives in: House/apartment Able to access home  OCCUPATION: Unemployed  PLOF: Independent  PATIENT GOALS:No appt scheduled  NEXT MD VISIT:   OBJECTIVE:  Note: Objective  measures were completed at Evaluation unless otherwise noted.  DIAGNOSTIC FINDINGS:  MRI of the left shoulder from 09/04/2023 was independently reviewed and interpreted, showing infraspinatus tendinopathy.  Increased signal near the footprint of the supraspinatus as well.  No rotator cuff tear seen.   PATIENT SURVEYS:  Quick Dash 42/55=75%  COGNITION: Overall cognitive status: Within functional limits for tasks assessed     SENSATION: WFL  POSTURE: Rounded shoulders  UPPER EXTREMITY ROM:   With all AROM L shoulder movements, the pt describe a burning sensation of the GH jt region.  With PROM for flexion and abd, pt reported pain near end range and N/T of her hand Active ROM Right eval Left eval Lt Lt 10/22/23  Shoulder flexion A 120 A 95, P  A 130 A130  Shoulder extension      Shoulder abduction A 132 A 80 A 110 A120  Shoulder adduction      Shoulder internal rotation A T11 A PSIS    Shoulder external rotation A T2 A UT    Elbow flexion      Elbow extension      Wrist flexion      Wrist extension      Wrist ulnar deviation      Wrist radial deviation      Wrist pronation      Wrist supination      (Blank rows = not tested)  UPPER EXTREMITY MMT:  R WNLs 4=-5/5. L Approx 3/5 with give way to pain for all movements MMT Right eval Left eval Lt 10/22/23  Shoulder flexion  3/5 4/5  Shoulder extension  3/5 4/5  Shoulder abduction  3/5 4/5  Shoulder adduction     Shoulder internal rotation  3/5 4/5  Shoulder external rotation  3/5 4/5  Middle trapezius     Lower trapezius     Elbow flexion     Elbow extension     Wrist flexion     Wrist extension     Wrist ulnar deviation     Wrist radial deviation     Wrist pronation     Wrist supination     Grip strength (lbs)     (  Blank rows = not tested)  SHOULDER SPECIAL TESTS: Impingement tests: Unable to assess properly due to L shoulder irritability SLAP lesions:  NT Instability tests:  NT Rotator cuff assessment:  Unable to assess properly due to L shoulder irritability Adson's Test: Positive  JOINT MOBILITY TESTING:  Stable   PALPATION:  TTP to the peri-GH jt and upper trap  TREATMENT DATE:  [facility] Adult PT Treatment:                                                DATE: 11/08/23 Therapeutic Exercise: UBE 1 min FWD, 3 mins BWD L1 Open books x 5 each way  PBall roll on wall Serratus lifts c pillow case x10 L scalene stretch x3 30" L doorway at 60d and 90d Shoulder ER 2x10 RTB Shouldr horz abd 2x10 Shoulder ext 2x10 GTB  Shoulder Row 2x10 GTB  Self Care Pt Ed for proper posture and computer set set up to minimize cervical, thoracic, shoulder strain/dysfunction  [facility] Adult PT Treatment:                                                DATE: 10/22/23 Therapeutic Exercise: UBE 1 min FWD, 3 mins BWD L1 Open books x 5 each way  L scalene stretch x3 30" L doorway at 60d and 90d Shoulder ER 2x10 RTB Shoulder ext 2x10 GTB  Shoulder Row 2x10 GTB  MMT ROM   [facility] Adult PT Treatment:                                                DATE: 10/18/22 Therapeutic Exercise: Open books x 5 each way  Thoracic extension FR, seated in chair Shoulder ER 2x10 RTB Shoulder ext 2x10 GTB  Shoulder Row 2x10 GTB  L scalene stretch x3 30" Manual Therapy: TPR left upper trap, levator and mid scap muscles Self Care: Use of tennis ball for mid back massage                                                                                                            PATIENT EDUCATION: Education details: Eval findings, POC, HEP, self care  Person educated: Patient Education method: Explanation, Demonstration, Tactile cues, Verbal cues, and Handouts Education comprehension: verbalized understanding, returned demonstration, verbal cues required, and tactile cues required  HOME EXERCISE PROGRAM: Access Code: [email or web address] URL: [email or web address] Date: 10/11/2023 Prepared by: Joellyn Rued  Exercises -  Seated Scalenes Stretch (Mirrored)  - 3 x daily - 7 x weekly - 1 sets - 3 reps - 30 hold - Doorway Pec Stretch at 60 Degrees Abduction with Arm Straight (Mirrored)  -  1 x daily - 7 x weekly - 1 sets - 3 reps - 30 hold - Single Arm Doorway Pec Stretch at 90 Degrees Abduction (Mirrored)  - 1 x daily - 7 x weekly - 1 sets - 10 reps - 30 hold - Standing Shoulder Row with Anchored Resistance  - 1 x daily - 7 x weekly - 2 sets - 10 reps - 3 hold - Shoulder Extension with Resistance  - 1 x daily - 7 x weekly - 2 sets - 10 reps - 3 hold - Shoulder External Rotation with Anchored Resistance  - 1 x daily - 7 x weekly - 2 sets - 10 reps - Shoulder Flexion Wall Slide with Towel  - 1 x daily - 7 x weekly - 1 sets - 10 reps - Shoulder Scaption Wall Slide with Towel  - 1 x daily - 7 x weekly - 1 sets - 10 reps Added 10/15/23  Sidelying Thoracic Rotation with Open Book  - 1 x daily - 7 x weekly - 1 sets - 10 reps - Seated Thoracic Lumbar Extension with Pectoralis Stretch  - 1 x daily - 7 x weekly - 1 sets - 10 reps  ASSESSMENT:  CLINICAL IMPRESSION: Continued PT for therex for anterior chest and neck flexibility; and for posterior chain and RC strengthening to improve L shoulder function. Additionally, Pt Ed and a handout was provided for proper posture and computer set up minimize cervical, thoracic, shoulder strain/dysfunction. Pt tolerated the prescribed therex without adverse effects. Pt reports frequency of L arm N/T is decreasing. Pt endorses [place] of massage, heat, and HEP to address her L shoulder symptoms. Pt will continue to benefit from skilled PT to address impairments for improved function.  EVAL; Patient is a 43 y.o. female who was seen today for physical therapy evaluation and treatment for M25.512 (ICD-10-CM) - Acute pain of left shoulder. Pt presents reporting a high level of L shoulder irritability with markedly decreased ROM and strength. A differential dx was hard to determine due ti irritability,  but the Adson's test was positive indicating TOS. A HEP was initiated. Pt will benefit from skilled PT 2w6 to address impairments to optimize l shoulder/UE function with less pain.   OBJECTIVE IMPAIRMENTS: decreased ROM, decreased strength, impaired UE functional use, obesity, and pain.   ACTIVITY LIMITATIONS: carrying, lifting, sleeping, dressing, reach over head, hygiene/grooming, and caring for others  PARTICIPATION LIMITATIONS: meal prep, cleaning, laundry, driving, and shopping  PERSONAL FACTORS: Fitness, Past/current experiences, Time since onset of injury/illness/exacerbation, and 3+ comorbidities: DM2, high BMI, Hx of cervical herniated disc 2011  are also affecting patient's functional outcome.   REHAB POTENTIAL: Good  CLINICAL DECISION MAKING: Evolving/moderate complexity  EVALUATION COMPLEXITY: Moderate   GOALS:   SHORT TERM GOALS = LTGs   LONG TERM GOALS: Target date: 11/22/23  Pt will be Ind in a final HEP to maintain achieved LOF  Baseline: started 10/18/23: Proper tech with VC Goal status: ONGOING  2.  Pt will voice understanding of measures to assist in pain reduction  Baseline: started 10/18/23: Stretches, massage gun, heat 11/08/23: HEP, massage gun, heat Goal status: MET  3.  The L shoulder will demonstrated AROMs with 90% of the R for appropriate functional mobility Baseline: see flow sheet Goal status: INITIAL  4.  The L shoulder will demonstrate at least 4/5 strength for approiate functional use of the L shoulder/UE Baseline: see flow sheet Goal status: INITIAL  5.  The pt will be able  to reach to cabinet repeatedly with at least 3#, not limited by pain   Baseline:  Goal status: INITIAL  6.   Pt's Quick Dash score will improve to 50% or less as indication of improved function  Baseline: 42/55= 75% Goal status: INITIAL  PLAN:  PT FREQUENCY: 2x/week  PT DURATION: 6 weeks  PLANNED INTERVENTIONS: 97164- PT Re-evaluation, 97110-Therapeutic  exercises, 97530- Therapeutic activity, 97112- Neuromuscular re-education, 97535- Self Care, 16109- Manual therapy, G0283- Electrical stimulation (unattended), 97035- Ultrasound, 60454- Ionotophoresis 4mg /ml Dexamethasone, Patient/Family education, Taping, Dry Needling, Joint mobilization, Cryotherapy, and Moist heat  PLAN FOR NEXT SESSION: Assess response to HEP; progress therex as indicated; use of modalities, manual therapy; and TPDN as indicate  (FEARFUL OF NEEDLES)  Joellyn Rued MS, PT 11/08/23 2:41 PM

## 2023-11-06 ENCOUNTER — Other Ambulatory Visit (HOSPITAL_COMMUNITY): Payer: Self-pay

## 2023-11-08 ENCOUNTER — Ambulatory Visit: Attending: Orthopedic Surgery

## 2023-11-08 DIAGNOSIS — M6281 Muscle weakness (generalized): Secondary | ICD-10-CM | POA: Diagnosis not present

## 2023-11-08 DIAGNOSIS — M25512 Pain in left shoulder: Secondary | ICD-10-CM | POA: Insufficient documentation

## 2023-11-08 DIAGNOSIS — R293 Abnormal posture: Secondary | ICD-10-CM | POA: Insufficient documentation

## 2023-11-08 DIAGNOSIS — G8929 Other chronic pain: Secondary | ICD-10-CM | POA: Insufficient documentation

## 2023-11-11 ENCOUNTER — Ambulatory Visit: Payer: Medicaid Other | Admitting: Student

## 2023-11-11 ENCOUNTER — Other Ambulatory Visit (HOSPITAL_COMMUNITY): Payer: Self-pay

## 2023-11-11 ENCOUNTER — Other Ambulatory Visit: Payer: Self-pay

## 2023-11-11 VITALS — BP 123/75 | HR 75 | Temp 97.5°F | Ht 62.0 in | Wt 202.5 lb

## 2023-11-11 DIAGNOSIS — F321 Major depressive disorder, single episode, moderate: Secondary | ICD-10-CM

## 2023-11-11 DIAGNOSIS — F331 Major depressive disorder, recurrent, moderate: Secondary | ICD-10-CM

## 2023-11-11 DIAGNOSIS — K219 Gastro-esophageal reflux disease without esophagitis: Secondary | ICD-10-CM | POA: Diagnosis not present

## 2023-11-11 DIAGNOSIS — E785 Hyperlipidemia, unspecified: Secondary | ICD-10-CM | POA: Diagnosis not present

## 2023-11-11 DIAGNOSIS — I10 Essential (primary) hypertension: Secondary | ICD-10-CM | POA: Diagnosis not present

## 2023-11-11 DIAGNOSIS — G43009 Migraine without aura, not intractable, without status migrainosus: Secondary | ICD-10-CM

## 2023-11-11 DIAGNOSIS — Z7985 Long-term (current) use of injectable non-insulin antidiabetic drugs: Secondary | ICD-10-CM | POA: Diagnosis not present

## 2023-11-11 DIAGNOSIS — F32A Depression, unspecified: Secondary | ICD-10-CM

## 2023-11-11 DIAGNOSIS — E119 Type 2 diabetes mellitus without complications: Secondary | ICD-10-CM

## 2023-11-11 DIAGNOSIS — Z7984 Long term (current) use of oral hypoglycemic drugs: Secondary | ICD-10-CM

## 2023-11-11 DIAGNOSIS — G43719 Chronic migraine without aura, intractable, without status migrainosus: Secondary | ICD-10-CM

## 2023-11-11 MED ORDER — ATORVASTATIN CALCIUM 80 MG PO TABS
80.0000 mg | ORAL_TABLET | Freq: Every day | ORAL | 3 refills | Status: DC
  Filled 2023-11-11: qty 90, 90d supply, fill #0
  Filled 2024-02-09: qty 90, 90d supply, fill #1

## 2023-11-11 MED ORDER — TOPIRAMATE 25 MG PO TABS
75.0000 mg | ORAL_TABLET | Freq: Every day | ORAL | 0 refills | Status: DC
  Filled 2023-11-11: qty 90, 30d supply, fill #0

## 2023-11-11 MED ORDER — NURTEC 75 MG PO TBDP
75.0000 mg | ORAL_TABLET | ORAL | 1 refills | Status: DC
  Filled 2023-11-11: qty 15, 30d supply, fill #0
  Filled 2023-12-11: qty 15, 30d supply, fill #1
  Filled 2024-01-15: qty 15, 30d supply, fill #2
  Filled 2024-02-09: qty 15, 30d supply, fill #3

## 2023-11-11 MED ORDER — VENLAFAXINE HCL ER 150 MG PO CP24
150.0000 mg | ORAL_CAPSULE | Freq: Every day | ORAL | 3 refills | Status: DC
  Filled 2023-11-11: qty 30, 30d supply, fill #0
  Filled 2023-12-11: qty 30, 30d supply, fill #1
  Filled 2024-01-15: qty 30, 30d supply, fill #2
  Filled 2024-02-09: qty 30, 30d supply, fill #3

## 2023-11-11 MED ORDER — AMLODIPINE BESYLATE 5 MG PO TABS
5.0000 mg | ORAL_TABLET | Freq: Every day | ORAL | 3 refills | Status: DC
  Filled 2023-11-11 – 2023-12-05 (×2): qty 90, 90d supply, fill #0

## 2023-11-11 NOTE — Progress Notes (Signed)
 Internal Medicine Clinic Attending  Case discussed with the resident at the time of the visit.  We reviewed the resident's history and exam and pertinent patient test results.  I agree with the assessment, diagnosis, and plan of care documented in the resident's note. Continues to have issues with migraines despite topamax and Nurtec therapy.  Will refer for consideration of botox injection therapy.

## 2023-11-11 NOTE — Assessment & Plan Note (Signed)
 BP Readings from Last 3 Encounters:  11/11/23 123/75  09/13/23 121/75  09/10/23 122/71  Patient reports he needs refills on her blood pressure medication.  Reports she takes amlodipine 5 mg daily and Hyzaar daily.  Blood pressure stable during this office visit. -Refills are sent to the pharmacy. -Continue amlodipine 5 mg daily and Hyzaar daily

## 2023-11-11 NOTE — Assessment & Plan Note (Addendum)
 Lab Results  Component Value Date   CHOL 152 09/13/2022   HDL 40 09/13/2022   LDLCALC 91 09/13/2022   TRIG 116 09/13/2022   CHOLHDL 3.8 09/13/2022    Refills for Lipitor 80 mg daily sent to the pharmacy

## 2023-11-11 NOTE — Progress Notes (Signed)
 Established Patient Office Visit  Subjective   Patient ID: Regina Cantu, female    DOB: 1981-04-18  Age: 43 y.o. MRN: [id number]  Chief Complaint  Patient presents with   Follow-up    Routine office visit / medication refill  / dm    HPI  This is a 43 year old female living with a history stated below and presents today for Medication refill. Please see problem based assessment and plan for additional details.    Past Medical History:  Diagnosis Date   Anxiety    Cancer (HCC)    ovarian 2001 and 2010   Depression    Essential hypertension 05/04/2022   Herniated disc, cervical 2011   Hypertension    Migraine    Pityriasis rosea    Uncontrolled type 2 diabetes mellitus with hyperglycemia (HCC) 05/04/2022   URI, acute 08/17/2022   ROS  As per assessment and Plan  Objective:     BP 123/75 (BP Location: Left Arm, Patient Position: Sitting, Cuff Size: Normal)   Pulse 75   Temp (!) 97.5 F (36.4 C) (Oral)   Ht 5\' 2"  (1.575 m)   Wt 202 lb 8 oz (91.9 kg)   SpO2 100%   BMI 37.04 kg/m  BP Readings from Last 3 Encounters:  11/11/23 123/75  09/13/23 121/75  09/10/23 122/71   Wt Readings from Last 3 Encounters:  11/11/23 202 lb 8 oz (91.9 kg)  09/13/23 193 lb 8 oz (87.8 kg)  09/10/23 193 lb 9.6 oz (87.8 kg)    Physical Exam General: Sitting in chair, no acute distress Cardiovascular: Regular rate, no murmurs appreciated Pulmonary: Breathing comfortably, no wheezing or crackles Abdomen: Soft, nontender, nondistended Psych: Flat mood and affect   No results found for any visits on 11/11/23.  Last CBC Lab Results  Component Value Date   WBC 9.8 08/09/2023   HGB 16.0 (H) 08/09/2023   HCT 46.7 (H) 08/09/2023   MCV 87.3 08/09/2023   MCH 29.9 08/09/2023   RDW 12.5 08/09/2023   PLT 397 08/09/2023   Last metabolic panel Lab Results  Component Value Date   GLUCOSE 94 08/09/2023   NA 137 08/09/2023   K 3.6 08/09/2023   CL 101 08/09/2023   CO2 27  08/09/2023   BUN 15 08/09/2023   CREATININE 0.76 08/09/2023   GFRNONAA >60 08/09/2023   CALCIUM 9.2 08/09/2023   PHOS 1.6 (L) 01/13/2022   PROT 6.6 03/19/2023   ALBUMIN 4.2 03/19/2023   LABGLOB 2.4 03/18/2023   BILITOT 0.3 03/19/2023   ALKPHOS 135 (H) 03/19/2023   AST 15 03/19/2023   ALT 40 (H) 03/19/2023   ANIONGAP 9 08/09/2023   Last lipids Lab Results  Component Value Date   CHOL 152 09/13/2022   HDL 40 09/13/2022   LDLCALC 91 09/13/2022   TRIG 116 09/13/2022   CHOLHDL 3.8 09/13/2022   Last hemoglobin A1c Lab Results  Component Value Date   HGBA1C 5.2 06/14/2023   Lab Results  Component Value Date   LDLCALC 91 09/13/2022     The ASCVD Risk score (Arnett DK, et al., 2019) failed to calculate for the following reasons:   Risk score cannot be calculated because patient has a medical history suggesting prior/existing ASCVD    Assessment & Plan:   Problem List Items Addressed This Visit       Cardiovascular and Mediastinum   Essential hypertension   BP Readings from Last 3 Encounters:  11/11/23 123/75  09/13/23 121/75  09/10/23 122/71  Patient reports he needs refills on her blood pressure medication.  Reports she takes amlodipine 5 mg daily and Hyzaar daily.  Blood pressure stable during this office visit. -Refills are sent to the pharmacy. -Continue amlodipine 5 mg daily and Hyzaar daily      Relevant Medications   amLODipine (NORVASC) 5 MG tablet   atorvastatin (LIPITOR) 80 MG tablet   Migraine   Patient continues to endorse daily migraines.  Reports that she takes Nurtec 75 mg daily and topiramate 50 mg daily.  However patient reports that she has not seen any changes in the frequency of her migraines.  Reports that she presently has a migraine and rates it about 5 out of 10.  Which is manageable.  However she does have episodes of breakthrough where it becomes unmanageable and affects her day-to-day life.  Reports that she has had these breakthrough  episodes about 2 times a week which is not normal for her.  States that she has had these migraines since the age of 24.  Patient is currently on the max dose of Nurtec, we will plan to increase topiramate to 75 mg daily.  -Increase topiramate to 75 mg daily at bedtime -Continue Nurtec 75 mg daily -Referral to migraine clinic as send -Patient is advised to call [facility] to schedule an appointment      Relevant Medications   amLODipine (NORVASC) 5 MG tablet   atorvastatin (LIPITOR) 80 MG tablet   venlafaxine XR (EFFEXOR-XR) 150 MG 24 hr capsule   Rimegepant Sulfate (NURTEC) 75 MG TBDP   topiramate (TOPAMAX) 25 MG tablet   Other Relevant Orders   AMB referral to headache clinic     Digestive   GERD (gastroesophageal reflux disease)   Patient reports he takes pantoprazole 40 mg daily, reports acid reflux and burping sensation.  Patient states that she received a message from [facility] about her PCP transferring to [facility]. -Continue pantoprazole 40 mg daily -Continue Linzess 145 mcg daily -Advised to call [facility] for EGD with a different provider        Endocrine   Type 2 diabetes mellitus (HCC)   Lab Results  Component Value Date   HGBA1C 5.2 06/14/2023   Patient reports he takes Trulicity 0.75 mg every Wednesday and Farxiga.  Denies any polyuria polydipsia.  -Continue Trulicity 0.25 mg once a week -Continue Farxiga 5 mg daily      Relevant Medications   atorvastatin (LIPITOR) 80 MG tablet     Other   Moderate major depression (HCC) - Primary   PHQ 9 score of 24, GAD score of 17, reports unstable mood and insomnia.  States that she is taking venlafaxine 150 mg, has not seen any improvement.  She goes to [employer] and speaks to [place] (therapist), Tammy Sours (peer support doctor) and Trinna Post. Reports that her symptoms are not improving.   She is on a max dose of Effexor, can consider adding Wellbutrin, however she is currently taking  topiramate.   - Referral to psychiatry placed to manage moderate depression, mood instability and anxiety  - Continue Venlafaxine 150 mg daily       Relevant Medications   venlafaxine XR (EFFEXOR-XR) 150 MG 24 hr capsule   Other Relevant Orders   Ambulatory referral to Psychiatry   Hyperlipidemia   Lab Results  Component Value Date   CHOL 152 09/13/2022   HDL 40 09/13/2022   LDLCALC 91 09/13/2022   TRIG 116 09/13/2022  CHOLHDL 3.8 09/13/2022    Refills for Lipitor 80 mg daily sent to the pharmacy      Relevant Medications   amLODipine (NORVASC) 5 MG tablet   atorvastatin (LIPITOR) 80 MG tablet    Return in about 3 months (around 02/10/2024) for DM and weightloss .    Jeral Pinch, DO

## 2023-11-11 NOTE — Assessment & Plan Note (Signed)
 Lab Results  Component Value Date   HGBA1C 5.2 06/14/2023   Patient reports he takes Trulicity 0.75 mg every Wednesday and Farxiga.  Denies any polyuria polydipsia.  -Continue Trulicity 0.25 mg once a week -Continue Farxiga 5 mg daily

## 2023-11-11 NOTE — Assessment & Plan Note (Signed)
 Patient reports he takes pantoprazole 40 mg daily, reports acid reflux and burping sensation.  Patient states that she received a message from [facility] about her PCP transferring to [facility]. -Continue pantoprazole 40 mg daily -Continue Linzess 145 mcg daily -Advised to call [facility] for EGD with a different provider

## 2023-11-11 NOTE — Assessment & Plan Note (Deleted)
 Patient continues to endorse daily migraines.  Reports that she takes Nurtec 75 mg daily and topiramate 50 mg daily.  However patient reports that she has not seen any changes in the frequency of her migraines.  Reports that she presently has a migraine and rates it about 5 out of 10.  Which is manageable.  However she does have episodes of breakthrough where it becomes unmanageable and affects her day-to-day life.  Reports that she has had these breakthrough episodes about 2 times a week which is not normal for her.  States that she has had these migraines since the age of 72.  Patient is currently on the max dose of Nurtec, we will plan to increase topiramate to 75 mg daily.  -Increase topiramate to 75 mg daily at bedtime -Continue Nurtec 75 mg daily -Referral to migraine clinic as send -Patient is advised to call [facility] to schedule an appointment

## 2023-11-11 NOTE — Assessment & Plan Note (Signed)
 PHQ 9 score of 24, GAD score of 17, reports unstable mood and insomnia.  States that she is taking venlafaxine 150 mg, has not seen any improvement.  She goes to [employer] and speaks to [place] (therapist), Tammy Sours (peer support doctor) and Trinna Post. Reports that her symptoms are not improving.   She is on a max dose of Effexor, can consider adding Wellbutrin, however she is currently taking topiramate.   - Referral to psychiatry placed to manage moderate depression, mood instability and anxiety  - Continue Venlafaxine 150 mg daily

## 2023-11-11 NOTE — Patient Instructions (Addendum)
 Thank you, [name] for allowing [place] to provide your care today. Today we discussed:  For your migraine -Continue taking Nurtec 75 mg daily -Take topiramate 25 mg, 2 tablets by mouth daily at bedtime -Please call and schedule an appointment with [facility] -I sent referral to migraine clinic -I will send an urgent referral to psychiatrist  Depression -Take naproxen, 1 tablet by mouth daily -I sent an urgent referral to psychiatrist, they will call you to schedule an appointment  Otherwise continue taking the rest of medication as prescribed  Please call [facility] to schedule an appointment for EGD.   I have ordered the following labs for you:  Lab Orders  No laboratory test(s) ordered today     Tests ordered today:  None   Referrals ordered today:   Referral Orders         AMB referral to headache clinic         Ambulatory referral to Psychiatry      I have ordered the following medication/changed the following medications:   Stop the following medications: Medications Discontinued During This Encounter  Medication Reason   amLODipine (NORVASC) 5 MG tablet Reorder   atorvastatin (LIPITOR) 80 MG tablet Reorder   venlafaxine XR (EFFEXOR-XR) 150 MG 24 hr capsule Reorder   Rimegepant Sulfate (NURTEC) 75 MG TBDP Reorder   topiramate (TOPAMAX) 25 MG tablet Reorder     Start the following medications: Meds ordered this encounter  Medications   amLODipine (NORVASC) 5 MG tablet    Sig: Take 1 tablet (5 mg total) by mouth daily.    Dispense:  90 tablet    Refill:  3   atorvastatin (LIPITOR) 80 MG tablet    Sig: Take 1 tablet (80 mg total) by mouth daily.    Dispense:  90 tablet    Refill:  3   venlafaxine XR (EFFEXOR-XR) 150 MG 24 hr capsule    Sig: Take 1 capsule (150 mg total) by mouth daily with breakfast.    Dispense:  30 capsule    Refill:  3   Rimegepant Sulfate (NURTEC) 75 MG TBDP    Sig: Take 1 tablet (75 mg total) by  mouth every other day.    Dispense:  30 tablet    Refill:  1   topiramate (TOPAMAX) 25 MG tablet    Sig: Take 3 tablets (75 mg total) by mouth at bedtime.    Dispense:  90 tablet    Refill:  0     Follow up: 3 months    Remember: DM   Should you have any questions or concerns please call the internal medicine clinic at [phone].     Jeral Pinch, DO [facility]

## 2023-11-11 NOTE — Assessment & Plan Note (Signed)
 Patient continues to endorse daily migraines.  Reports that she takes Nurtec 75 mg daily and topiramate 50 mg daily.  However patient reports that she has not seen any changes in the frequency of her migraines.  Reports that she presently has a migraine and rates it about 5 out of 10.  Which is manageable.  However she does have episodes of breakthrough where it becomes unmanageable and affects her day-to-day life.  Reports that she has had these breakthrough episodes about 2 times a week which is not normal for her.  States that she has had these migraines since the age of 72.  Patient is currently on the max dose of Nurtec, we will plan to increase topiramate to 75 mg daily.  -Increase topiramate to 75 mg daily at bedtime -Continue Nurtec 75 mg daily -Referral to migraine clinic as send -Patient is advised to call [facility] to schedule an appointment

## 2023-11-13 NOTE — Therapy (Signed)
 OUTPATIENT PHYSICAL THERAPY SHOULDER TREATMENT   Patient Name: Regina Cantu MRN: [id number] DOB:15-Mar-1981, 43 y.o., female Today's Date: 11/13/2023  END OF SESSION:        Past Medical History:  Diagnosis Date   Anxiety    Cancer (HCC)    ovarian 2001 and 2010   Depression    Essential hypertension 05/04/2022   Herniated disc, cervical 2011   Hypertension    Migraine    Pityriasis rosea    Uncontrolled type 2 diabetes mellitus with hyperglycemia (HCC) 05/04/2022   URI, acute 08/17/2022   Past Surgical History:  Procedure Laterality Date   ABDOMINAL HYSTERECTOMY     COLONOSCOPY WITH PROPOFOL N/A 09/10/2023   Procedure: COLONOSCOPY WITH PROPOFOL;  Surgeon: Midge Minium, MD;  Location: [facility] ENDOSCOPY;  Service: Endoscopy;  Laterality: N/A;   OVARIAN CANCER     Patient Active Problem List   Diagnosis Date Noted   Hyperlipidemia 11/11/2023   GERD (gastroesophageal reflux disease) 09/14/2023   Constipation 09/10/2023   LOC (loss of consciousness) (HCC) 08/09/2023   Incontinence of feces 08/09/2023   Back pain 03/18/2023   Abnormal Pap smear of cervix 12/27/2022   Tobacco use 08/17/2022   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  Rationale for Evaluation and Treatment: Rehabilitation  ONSET DATE: 2 months ago  SUBJECTIVE:                                                                                                                                                                                       SUBJECTIVE STATEMENT: Pt reports in certain positions her L shoulder feels like it gets stuck. The L arm feels heavy. Heat helps to relax it. N/T of the L arm is less frequent.  EVAL: Pt reports approx 2 months ago she was driving and trying to turn her car by palming the steering wheel with her L hand. She states the L shoulder locked up on her and she had to use her R hand to continue driving. Currently, the L shoulder is not as painful, but feels weak and "disconnected".  Hand dominance:  Right  PERTINENT HISTORY: DM2, high BMI, Hx of cervical herniated disc 2011  PAIN:  Are you having pain? Yes: NPRS scale:0/10. Sensation of being heavy.Pain range during week prior to start of PT: 2-8/10.  Pain location: L shoulder and neck Pain description: sharp, bone is stuck, ache Aggravating factors: Forcing movement when stuck Relieving factors: Finding the right position for the L arm, cold pack, sling  PRECAUTIONS: None  RED FLAGS: None   WEIGHT BEARING RESTRICTIONS: No  FALLS:  Has patient fallen in last 6 months? No  LIVING ENVIRONMENT: Lives with: lives with their family Lives in: House/apartment Able to access home  OCCUPATION: Unemployed  PLOF: Independent  PATIENT GOALS:No appt scheduled  NEXT MD VISIT:   OBJECTIVE:  Note: Objective measures were completed at Evaluation unless otherwise noted.  DIAGNOSTIC FINDINGS:  MRI of the left shoulder from 09/04/2023 was independently reviewed and interpreted, showing infraspinatus tendinopathy.  Increased signal near the footprint of the supraspinatus as well.  No rotator cuff tear seen.   PATIENT SURVEYS:  Quick Dash 42/55=75%  COGNITION: Overall cognitive status: Within functional limits for tasks assessed     SENSATION: WFL  POSTURE: Rounded shoulders  UPPER EXTREMITY ROM:   With  all AROM L shoulder movements, the pt describe a burning sensation of the GH jt region.  With PROM for flexion and abd, pt reported pain near end range and N/T of her hand Active ROM Right eval Left eval Lt Lt 10/22/23  Shoulder flexion A 120 A 95, P  A 130 A130  Shoulder extension      Shoulder abduction A 132 A 80 A 110 A120  Shoulder adduction      Shoulder internal rotation A T11 A PSIS    Shoulder external rotation A T2 A UT    Elbow flexion      Elbow extension      Wrist flexion      Wrist extension      Wrist ulnar deviation      Wrist radial deviation      Wrist pronation      Wrist supination      (Blank rows = not tested)  UPPER EXTREMITY MMT:  R WNLs 4=-5/5. L Approx 3/5 with give way to pain for all movements MMT Right eval Left eval Lt 10/22/23  Shoulder flexion  3/5 4/5  Shoulder extension  3/5 4/5  Shoulder abduction  3/5 4/5  Shoulder adduction     Shoulder internal rotation  3/5 4/5  Shoulder external rotation  3/5 4/5  Middle trapezius     Lower trapezius     Elbow flexion     Elbow extension     Wrist flexion     Wrist extension     Wrist ulnar deviation     Wrist radial deviation     Wrist pronation     Wrist supination     Grip strength (lbs)     (Blank rows = not tested)  SHOULDER SPECIAL TESTS: Impingement tests: Unable to assess properly due to L shoulder irritability SLAP lesions:  NT Instability tests:  NT Rotator cuff assessment: Unable to assess properly due to L shoulder irritability Adson's Test: Positive  JOINT MOBILITY TESTING:  Stable   PALPATION:  TTP to the peri-GH jt and upper trap  TREATMENT DATE:  [facility] Adult PT Treatment:  DATE: 11/15/23 Therapeutic Exercise: UBE 1 min FWD, 3 mins BWD L1 Open books x 5 each way  PBall roll on wall Serratus lifts c pillow case x10 L scalene stretch x3 30" L doorway at 60d and 90d Shoulder ER 2x10 RTB Shouldr horz abd 2x10 Shoulder  ext 2x10 GTB  Shoulder Row 2x10 GTB  Self Care Pt Ed for proper posture and computer set set up to minimize cervical, thoracic, shoulder strain/dysfunction Therapeutic Exercise: *** Manual Therapy: *** Neuromuscular re-ed: *** Therapeutic Activity: *** Modalities: *** Self Care: ***   [facility] Adult PT Treatment:                                                DATE: 11/08/23 Therapeutic Exercise: UBE 1 min FWD, 3 mins BWD L1 Open books x 5 each way  PBall roll on wall Serratus lifts c pillow case x10 L scalene stretch x3 30" L doorway at 60d and 90d Shoulder ER 2x10 RTB Shouldr horz abd 2x10 Shoulder ext 2x10 GTB  Shoulder Row 2x10 GTB  Self Care Pt Ed for proper posture and computer set set up to minimize cervical, thoracic, shoulder strain/dysfunction  [facility] Adult PT Treatment:                                                DATE: 10/22/23 Therapeutic Exercise: UBE 1 min FWD, 3 mins BWD L1 Open books x 5 each way  L scalene stretch x3 30" L doorway at 60d and 90d Shoulder ER 2x10 RTB Shoulder ext 2x10 GTB  Shoulder Row 2x10 GTB  MMT ROM                                                                                                             PATIENT EDUCATION: Education details: Eval findings, POC, HEP, self care  Person educated: Patient Education method: Explanation, Demonstration, Tactile cues, Verbal cues, and Handouts Education comprehension: verbalized understanding, returned demonstration, verbal cues required, and tactile cues required  HOME EXERCISE PROGRAM: Access Code: [email or web address] URL: [email or web address] Date: 10/11/2023 Prepared by: Joellyn Rued  Exercises - Seated Scalenes Stretch (Mirrored)  - 3 x daily - 7 x weekly - 1 sets - 3 reps - 30 hold - Doorway Pec Stretch at 60 Degrees Abduction with Arm Straight (Mirrored)  - 1 x daily - 7 x weekly - 1 sets - 3 reps - 30 hold - Single Arm Doorway Pec Stretch at 90 Degrees Abduction (Mirrored)   - 1 x daily - 7 x weekly - 1 sets - 10 reps - 30 hold - Standing Shoulder Row with Anchored Resistance  - 1 x daily - 7 x weekly - 2 sets - 10 reps - 3 hold - Shoulder Extension with Resistance  - 1 x  daily - 7 x weekly - 2 sets - 10 reps - 3 hold - Shoulder External Rotation with Anchored Resistance  - 1 x daily - 7 x weekly - 2 sets - 10 reps - Shoulder Flexion Wall Slide with Towel  - 1 x daily - 7 x weekly - 1 sets - 10 reps - Shoulder Scaption Wall Slide with Towel  - 1 x daily - 7 x weekly - 1 sets - 10 reps Added 10/15/23  Sidelying Thoracic Rotation with Open Book  - 1 x daily - 7 x weekly - 1 sets - 10 reps - Seated Thoracic Lumbar Extension with Pectoralis Stretch  - 1 x daily - 7 x weekly - 1 sets - 10 reps  ASSESSMENT:  CLINICAL IMPRESSION: Continued PT for therex for anterior chest and neck flexibility; and for posterior chain and RC strengthening to improve L shoulder function. Additionally, Pt Ed and a handout was provided for proper posture and computer set up minimize cervical, thoracic, shoulder strain/dysfunction. Pt tolerated the prescribed therex without adverse effects. Pt reports frequency of L arm N/T is decreasing. Pt endorses [place] of massage, heat, and HEP to address her L shoulder symptoms. Pt will continue to benefit from skilled PT to address impairments for improved function.  EVAL; Patient is a 43 y.o. female who was seen today for physical therapy evaluation and treatment for M25.512 (ICD-10-CM) - Acute pain of left shoulder. Pt presents reporting a high level of L shoulder irritability with markedly decreased ROM and strength. A differential dx was hard to determine due ti irritability, but the Adson's test was positive indicating TOS. A HEP was initiated. Pt will benefit from skilled PT 2w6 to address impairments to optimize l shoulder/UE function with less pain.   OBJECTIVE IMPAIRMENTS: decreased ROM, decreased strength, impaired UE functional use, obesity, and pain.    ACTIVITY LIMITATIONS: carrying, lifting, sleeping, dressing, reach over head, hygiene/grooming, and caring for others  PARTICIPATION LIMITATIONS: meal prep, cleaning, laundry, driving, and shopping  PERSONAL FACTORS: Fitness, Past/current experiences, Time since onset of injury/illness/exacerbation, and 3+ comorbidities: DM2, high BMI, Hx of cervical herniated disc 2011  are also affecting patient's functional outcome.   REHAB POTENTIAL: Good  CLINICAL DECISION MAKING: Evolving/moderate complexity  EVALUATION COMPLEXITY: Moderate   GOALS:   SHORT TERM GOALS = LTGs   LONG TERM GOALS: Target date: 11/22/23  Pt will be Ind in a final HEP to maintain achieved LOF  Baseline: started 10/18/23: Proper tech with VC Goal status: ONGOING  2.  Pt will voice understanding of measures to assist in pain reduction  Baseline: started 10/18/23: Stretches, massage gun, heat 11/08/23: HEP, massage gun, heat Goal status: MET  3.  The L shoulder will demonstrated AROMs with 90% of the R for appropriate functional mobility Baseline: see flow sheet Goal status: INITIAL  4.  The L shoulder will demonstrate at least 4/5 strength for approiate functional use of the L shoulder/UE Baseline: see flow sheet Goal status: INITIAL  5.  The pt will be able to reach to cabinet repeatedly with at least 3#, not limited by pain   Baseline:  Goal status: INITIAL  6.   Pt's Quick Dash score will improve to 50% or less as indication of improved function  Baseline: 42/55= 75% Goal status: INITIAL  PLAN:  PT FREQUENCY: 2x/week  PT DURATION: 6 weeks  PLANNED INTERVENTIONS: 97164- PT Re-evaluation, 97110-Therapeutic exercises, 97530- Therapeutic activity, [id number]- Neuromuscular re-education, 97535- Self Care, 16109- Manual therapy, [id number]- Electrical  stimulation (unattended), 97035- Ultrasound, 16109- Ionotophoresis 4mg /ml Dexamethasone, Patient/Family education, Taping, Dry Needling, Joint mobilization,  Cryotherapy, and Moist heat  PLAN FOR NEXT SESSION: Assess response to HEP; progress therex as indicated; use of modalities, manual therapy; and TPDN as indicate  (FEARFUL OF NEEDLES)  Joellyn Rued MS, PT 11/13/23 2:36 PM

## 2023-11-15 ENCOUNTER — Ambulatory Visit

## 2023-11-15 DIAGNOSIS — G8929 Other chronic pain: Secondary | ICD-10-CM

## 2023-11-15 DIAGNOSIS — M6281 Muscle weakness (generalized): Secondary | ICD-10-CM | POA: Diagnosis not present

## 2023-11-15 DIAGNOSIS — R293 Abnormal posture: Secondary | ICD-10-CM

## 2023-11-15 DIAGNOSIS — M25512 Pain in left shoulder: Secondary | ICD-10-CM | POA: Diagnosis not present

## 2023-11-19 NOTE — Therapy (Signed)
 OUTPATIENT PHYSICAL THERAPY SHOULDER TREATMENT/Discharge   Patient Name: Regina Cantu MRN: [id number] DOB:June 18, 1981, 43 y.o., female Today's Date: 11/21/2023  END OF SESSION:  PT End of Session - 11/20/23 1423     Visit Number 8    Number of Visits 13    Date for PT Re-Evaluation 11/22/23    Authorization Type [place] MEDICA[employer] [place]    Authorization - Visit Number 8    Authorization - Number of Visits 27    PT Start Time 1417    PT Stop Time 1458    PT Time Calculation (min) 41 min    Activity Tolerance Patient tolerated treatment well    Behavior During Therapy [facility] for tasks assessed/performed                   Past Medical History:  Diagnosis Date   Anxiety    Cancer (HCC)    ovarian 2001 and 2010   Depression    Essential hypertension 05/04/2022   Herniated disc, cervical 2011   Hypertension    Migraine    Pityriasis rosea    Uncontrolled type 2 diabetes mellitus with hyperglycemia (HCC) 05/04/2022   URI, acute 08/17/2022   Past Surgical History:  Procedure Laterality Date   ABDOMINAL HYSTERECTOMY     COLONOSCOPY WITH PROPOFOL N/A 09/10/2023   Procedure: COLONOSCOPY WITH PROPOFOL;  Surgeon: Marnee Sink, MD;  Location: [facility] ENDOSCOPY;  Service: Endoscopy;  Laterality: N/A;   OVARIAN CANCER     Patient Active Problem List   Diagnosis Date Noted   Hyperlipidemia 11/11/2023   GERD (gastroesophageal reflux disease) 09/14/2023   Constipation 09/10/2023   LOC (loss of consciousness) (HCC) 08/09/2023   Incontinence of feces 08/09/2023   Back pain 03/18/2023   Abnormal Pap smear of cervix 12/27/2022   Tobacco use 08/17/2022   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   Healthcare maintenance 07/04/2022   Migraine 07/04/2022   History of CVA (cerebrovascular accident) 05/04/2022   History of ovarian cancer 05/04/2022   Type 2 diabetes mellitus (HCC) 05/04/2022   Essential hypertension 05/04/2022   Malignant (primary) neoplasm,  unspecified (HCC) 05/24/2021   Other specified disorders of eustachian tube, bilateral 05/24/2021   Allergy, unspecified, initial encounter 05/24/2021    PCP: Vicente Graham, DO  REFERRING PROVIDER: Diedra Fowler, MD   REFERRING DIAG: [phone] (ICD-10-CM) - Acute pain of left shoulder   THERAPY DIAG:  Chronic left shoulder pain  Muscle weakness (generalized)  Abnormal posture  Rationale for Evaluation and Treatment: Rehabilitation  ONSET DATE: 2 months ago  SUBJECTIVE:  SUBJECTIVE STATEMENT: Pt reports her L shoulder pain, N/T, and functional use has made good progress over the course of PT.  EVAL: Pt reports approx 2 months ago she was driving and trying to turn her car by palming the steering wheel with her L hand. She states the L shoulder locked up on her and she had to use her R hand to continue driving. Currently, the L shoulder is not as painful, but feels weak and "disconnected".  Hand dominance: Right  PERTINENT HISTORY: DM2, high BMI, Hx of cervical herniated disc 2011  PAIN:  Are you having pain? Yes: NPRS scale:0/10. Sensation of being heavy.Pain range during week prior to start of PT: 2-8/10.  Pain location: L shoulder and neck Pain description: sharp, bone is stuck, ache Aggravating factors: Forcing movement when stuck Relieving factors: Finding the right position for the L arm, cold pack, sling  PRECAUTIONS: None  RED FLAGS: None   WEIGHT BEARING RESTRICTIONS: No  FALLS:  Has patient fallen in last 6 months? No  LIVING ENVIRONMENT: Lives with: lives with their family Lives in: House/apartment Able to access home  OCCUPATION: Unemployed  PLOF: Independent  PATIENT GOALS:No appt scheduled  NEXT MD VISIT:   OBJECTIVE:  Note: Objective measures were completed  at Evaluation unless otherwise noted.  DIAGNOSTIC FINDINGS:  MRI of the left shoulder from 09/04/2023 was independently reviewed and interpreted, showing infraspinatus tendinopathy.  Increased signal near the footprint of the supraspinatus as well.  No rotator cuff tear seen.   PATIENT SURVEYS:  Quick Dash 42/55=75%  COGNITION: Overall cognitive status: Within functional limits for tasks assessed     SENSATION: WFL  POSTURE: Rounded shoulders  UPPER EXTREMITY ROM:   With all AROM L shoulder movements, the pt describe a burning sensation of the GH jt region.  With PROM for flexion and abd, pt reported pain near end range and N/T of her hand Active ROM Right eval Left eval Lt Lt 10/22/23 Lt 11/15/23  Shoulder flexion A 120 A 95, P  A 130 A130 A140  Shoulder extension       Shoulder abduction A 132 A 80 A 110 A120 125  Shoulder adduction       Shoulder internal rotation A T11 A PSIS   L4  Shoulder external rotation A T2 A UT   T2  Elbow flexion       Elbow extension       Wrist flexion       Wrist extension       Wrist ulnar deviation       Wrist radial deviation       Wrist pronation       Wrist supination       (Blank rows = not tested)  UPPER EXTREMITY MMT:  R WNLs 4=-5/5. L Approx 3/5 with give way to pain for all movements MMT Right eval Left eval Lt 10/22/23  Shoulder flexion  3/5 4/5  Shoulder extension  3/5 4/5  Shoulder abduction  3/5 4/5  Shoulder adduction     Shoulder internal rotation  3/5 4/5  Shoulder external rotation  3/5 4/5  Middle trapezius     Lower trapezius     Elbow flexion     Elbow extension     Wrist flexion     Wrist extension     Wrist ulnar deviation     Wrist radial deviation     Wrist pronation     Wrist supination  Grip strength (lbs)     (Blank rows = not tested)  SHOULDER SPECIAL TESTS: Impingement tests: Unable to assess properly due to L shoulder irritability SLAP lesions:  NT Instability tests:  NT Rotator cuff  assessment: Unable to assess properly due to L shoulder irritability Adson's Test: Positive  JOINT MOBILITY TESTING:  Stable   PALPATION:  TTP to the peri-GH jt and upper trap  TREATMENT DATE:  [facility] Adult PT Treatment:                                                DATE: 11/20/23  Therapeutic Exercise: UBE 2 min FWD, 2 mins BWD L1 Foam Roller Routine Door way pec stretches 60d and 90d Ant scalene stretches  SL open books Chin tucks x10 3" V to Y's x15 Alt punches 3#  Shouldr horz abd 2x10 Shoulder ER 2x10 RTB Serratus lifts c pillow case 2x10 Foam roller for thoracic mobility OH self reach c 3# weight x10 Updated HEP  [facility] Adult PT Treatment:                                                DATE: 11/15/23 Therapeutic Exercise: UBE 2 min FWD, 2 mins BWD L1 Foam Roller Routine Chin tucks x10 3" V to Y's x15 Alt punches 3#  Shouldr horz abd 2x10 Shoulder ER 2x10 RTB Serratus lifts c pillow case 2x10 Foam roller for thoracic mobility OH self reach c 3# weight x10  [facility] Adult PT Treatment:                                                DATE: 11/08/23 Therapeutic Exercise: UBE 1 min FWD, 3 mins BWD L1 Open books x 5 each way  PBall roll on wall Serratus lifts c pillow case x10 L scalene stretch x3 30" L doorway at 60d and 90d Shoulder ER 2x10 RTB Shouldr horz abd 2x10 Shoulder ext 2x10 GTB  Shoulder Row 2x10 GTB  Self Care Pt Ed for proper posture and computer set set up to minimize cervical, thoracic, shoulder strain/dysfunction                                                                              PATIENT EDUCATION: Education details: Eval findings, POC, HEP, self care  Person educated: Patient Education method: Explanation, Demonstration, Tactile cues, Verbal cues, and Handouts Education comprehension: verbalized understanding, returned demonstration, verbal cues required, and tactile cues required  HOME EXERCISE PROGRAM: Access Code: [email or web address] URL:  [email or web address] Date: 11/20/2023 Prepared by: Joellyn Rued  Exercises - Seated Scalenes Stretch (Mirrored)  - 3 x daily - 7 x weekly - 1 sets - 3 reps - 30 hold - Doorway Pec Stretch at 60 Degrees Abduction with Arm Straight (Mirrored)  - 1 x daily -  7 x weekly - 1 sets - 3 reps - 30 hold - Single Arm Doorway Pec Stretch at 90 Degrees Abduction (Mirrored)  - 1 x daily - 7 x weekly - 1 sets - 10 reps - 30 hold - Standing Shoulder Row with Anchored Resistance  - 1 x daily - 7 x weekly - 2 sets - 10 reps - 3 hold - Shoulder Extension with Resistance  - 1 x daily - 7 x weekly - 2 sets - 10 reps - 3 hold - Standing Shoulder Diagonal Horizontal Abduction 60/120 Degrees with Resistance  - 1 x daily - 7 x weekly - 2 sets - 10 reps - 3 hold - Shoulder External Rotation and Scapular Retraction with Resistance  - 1 x daily - 7 x weekly - 2 sets - 10 reps - 3 hold - Shoulder Flexion Wall Slide with Towel  - 1 x daily - 7 x weekly - 1 sets - 10 reps - Shoulder Scaption Wall Slide with Towel  - 1 x daily - 7 x weekly - 1 sets - 10 reps - Sidelying Thoracic Rotation with Open Book  - 1 x daily - 7 x weekly - 1 sets - 10 reps - Seated Thoracic Lumbar Extension with Pectoralis Stretch  - 1 x daily - 7 x weekly - 1 sets - 10 reps  ASSESSMENT:  CLINICAL IMPRESSION: Provided PT for thoracic and L shoulder mobility and posterior chain and RC strengthening with a foam roller routine. A final HEP was reviewed with pt demonstrating proper understanding. Pt has made good progress in PT re: pain, N/T, and L shoulder ROM and strength, and function. All PT goals have been met. Pt is in agreement with DC from PT services.  EVAL; Patient is a 43 y.o. female who was seen today for physical therapy evaluation and treatment for M25.512 (ICD-10-CM) - Acute pain of left shoulder. Pt presents reporting a high level of L shoulder irritability with markedly decreased ROM and strength. A differential dx was hard  to determine due ti irritability, but the Adson's test was positive indicating TOS. A HEP was initiated. Pt will benefit from skilled PT 2w6 to address impairments to optimize l shoulder/UE function with less pain.   OBJECTIVE IMPAIRMENTS: decreased ROM, decreased strength, impaired UE functional use, obesity, and pain.   ACTIVITY LIMITATIONS: carrying, lifting, sleeping, dressing, reach over head, hygiene/grooming, and caring for others  PARTICIPATION LIMITATIONS: meal prep, cleaning, laundry, driving, and shopping  PERSONAL FACTORS: Fitness, Past/current experiences, Time since onset of injury/illness/exacerbation, and 3+ comorbidities: DM2, high BMI, Hx of cervical herniated disc 2011  are also affecting patient's functional outcome.   REHAB POTENTIAL: Good  CLINICAL DECISION MAKING: Evolving/moderate complexity  EVALUATION COMPLEXITY: Moderate   GOALS:   SHORT TERM GOALS = LTGs   LONG TERM GOALS: Target date: 11/22/23  Pt will be Ind in a final HEP to maintain achieved LOF  Baseline: started 10/18/23: Proper tech with VC Goal status: MET 11/20/23  2.  Pt will voice understanding of measures to assist in pain reduction  Baseline: started 10/18/23: Stretches, massage gun, heat 11/08/23: HEP, massage gun, heat Goal status: MET  3.  The L shoulder will demonstrated AROMs with 90% of the R for appropriate functional mobility Baseline: see flow sheet 11/15/23: see flow sheet Goal status: MET  4.  The L shoulder will demonstrate at least 4/5 strength for approiate functional use of the L shoulder/UE Baseline: see flow sheet 10/22/23: Goal status: MET  5.  The pt will be able to reach to cabinet repeatedly with at least 3#, not limited by pain   Baseline:  Goal status: MET 11/15/23  6.   Pt's Quick Dash score will improve to 50% or less as indication of improved function  Baseline: 42/55= 75% 11/20/23: 38% Goal status: MET  PLAN:  PT FREQUENCY: 2x/week  PT DURATION: 6  weeks  PLANNED INTERVENTIONS: 97164- PT Re-evaluation, 97110-Therapeutic exercises, 97530- Therapeutic activity, 97112- Neuromuscular re-education, 97535- Self Care, 27253- Manual therapy, G0283- Electrical stimulation (unattended), 97035- Ultrasound, 66440- Ionotophoresis 4mg /ml Dexamethasone, Patient/Family education, Taping, Dry Needling, Joint mobilization, Cryotherapy, and Moist heat  PLAN FOR NEXT SESSION: Assess response to HEP; progress therex as indicated; use of modalities, manual therapy; and TPDN as indicate  (FEARFUL OF NEEDLES)  PHYSICAL THERAPY DISCHARGE SUMMARY  Visits from Start of Care: 8  Current functional level related to goals / functional outcomes: See clinical impression and PT goals    Remaining deficits: See clinical impression and PT goals    Education / Equipment: HEP. Pt Ed   Patient agrees to discharge. Patient goals were met. Patient is being discharged due to meeting the stated rehab goals.   Anitria Andon MS, PT 11/21/23 8:54 AM

## 2023-11-20 ENCOUNTER — Other Ambulatory Visit (HOSPITAL_COMMUNITY): Payer: Self-pay

## 2023-11-20 ENCOUNTER — Ambulatory Visit

## 2023-11-20 DIAGNOSIS — M25512 Pain in left shoulder: Secondary | ICD-10-CM | POA: Diagnosis not present

## 2023-11-20 DIAGNOSIS — G8929 Other chronic pain: Secondary | ICD-10-CM

## 2023-11-20 DIAGNOSIS — R293 Abnormal posture: Secondary | ICD-10-CM | POA: Diagnosis not present

## 2023-11-20 DIAGNOSIS — M6281 Muscle weakness (generalized): Secondary | ICD-10-CM

## 2023-12-03 ENCOUNTER — Telehealth: Payer: Self-pay | Admitting: Internal Medicine

## 2023-12-03 NOTE — Telephone Encounter (Signed)
 Referral has been sent.  Copied from CRM [id number]. Topic: Referral - Request for Referral >> Dec 03, 2023  2:21 PM Karole Pacer C wrote: Did the patient discuss referral with their provider in the last year? Yes (If No - schedule appointment) (If Yes - send message)  Appointment offered? No  Type of order/referral and detailed reason for visit: Shrink   Preference of office, provider, location: Someone within [facility] network   If referral order, have you been seen by this specialty before? Yes (If Yes, this issue or another issue? When? Where?  Can we respond through MyChart? Yes

## 2023-12-05 ENCOUNTER — Other Ambulatory Visit (HOSPITAL_COMMUNITY): Payer: Self-pay

## 2023-12-11 ENCOUNTER — Other Ambulatory Visit: Payer: Self-pay

## 2023-12-11 ENCOUNTER — Other Ambulatory Visit: Payer: Self-pay | Admitting: Student

## 2023-12-11 DIAGNOSIS — G43719 Chronic migraine without aura, intractable, without status migrainosus: Secondary | ICD-10-CM

## 2023-12-12 ENCOUNTER — Other Ambulatory Visit (HOSPITAL_COMMUNITY): Payer: Self-pay

## 2023-12-12 MED ORDER — TOPIRAMATE 25 MG PO TABS
75.0000 mg | ORAL_TABLET | Freq: Every day | ORAL | 3 refills | Status: DC
  Filled 2023-12-12: qty 90, 30d supply, fill #0
  Filled 2024-01-15: qty 90, 30d supply, fill #1
  Filled 2024-02-09: qty 90, 30d supply, fill #2

## 2023-12-13 ENCOUNTER — Other Ambulatory Visit (HOSPITAL_COMMUNITY): Payer: Self-pay

## 2023-12-13 DIAGNOSIS — F3110 Bipolar disorder, current episode manic without psychotic features, unspecified: Secondary | ICD-10-CM | POA: Diagnosis not present

## 2023-12-13 DIAGNOSIS — F4310 Post-traumatic stress disorder, unspecified: Secondary | ICD-10-CM | POA: Diagnosis not present

## 2023-12-20 DIAGNOSIS — F3110 Bipolar disorder, current episode manic without psychotic features, unspecified: Secondary | ICD-10-CM | POA: Diagnosis not present

## 2023-12-20 DIAGNOSIS — F4310 Post-traumatic stress disorder, unspecified: Secondary | ICD-10-CM | POA: Diagnosis not present

## 2023-12-26 DIAGNOSIS — F4310 Post-traumatic stress disorder, unspecified: Secondary | ICD-10-CM | POA: Diagnosis not present

## 2023-12-26 DIAGNOSIS — F3110 Bipolar disorder, current episode manic without psychotic features, unspecified: Secondary | ICD-10-CM | POA: Diagnosis not present

## 2024-01-05 ENCOUNTER — Other Ambulatory Visit: Payer: Self-pay | Admitting: Student

## 2024-01-05 ENCOUNTER — Other Ambulatory Visit: Payer: Self-pay | Admitting: Orthopedic Surgery

## 2024-01-05 ENCOUNTER — Other Ambulatory Visit: Payer: Self-pay

## 2024-01-05 DIAGNOSIS — K219 Gastro-esophageal reflux disease without esophagitis: Secondary | ICD-10-CM

## 2024-01-05 MED ORDER — LIDOCAINE 5 % EX PTCH
1.0000 | MEDICATED_PATCH | CUTANEOUS | 0 refills | Status: AC
  Filled 2024-01-05 – 2024-02-09 (×2): qty 30, 30d supply, fill #0

## 2024-01-05 MED ORDER — METHOCARBAMOL 500 MG PO TABS
500.0000 mg | ORAL_TABLET | Freq: Three times a day (TID) | ORAL | 0 refills | Status: DC | PRN
  Filled 2024-01-05: qty 60, 20d supply, fill #0

## 2024-01-06 ENCOUNTER — Other Ambulatory Visit: Payer: Self-pay

## 2024-01-06 ENCOUNTER — Other Ambulatory Visit (HOSPITAL_COMMUNITY): Payer: Self-pay

## 2024-01-06 ENCOUNTER — Other Ambulatory Visit (HOSPITAL_BASED_OUTPATIENT_CLINIC_OR_DEPARTMENT_OTHER): Payer: Self-pay

## 2024-01-06 MED FILL — Omeprazole Cap Delayed Release 20 MG: ORAL | 30 days supply | Qty: 60 | Fill #0 | Status: CN

## 2024-01-06 NOTE — Telephone Encounter (Signed)
 Medication sent to pharmacy

## 2024-01-09 ENCOUNTER — Other Ambulatory Visit (HOSPITAL_COMMUNITY): Payer: Self-pay

## 2024-01-09 DIAGNOSIS — F4310 Post-traumatic stress disorder, unspecified: Secondary | ICD-10-CM | POA: Diagnosis not present

## 2024-01-09 DIAGNOSIS — F3110 Bipolar disorder, current episode manic without psychotic features, unspecified: Secondary | ICD-10-CM | POA: Diagnosis not present

## 2024-01-15 ENCOUNTER — Other Ambulatory Visit (HOSPITAL_COMMUNITY): Payer: Self-pay

## 2024-01-15 ENCOUNTER — Other Ambulatory Visit: Payer: Self-pay

## 2024-01-16 ENCOUNTER — Other Ambulatory Visit (HOSPITAL_COMMUNITY): Payer: Self-pay

## 2024-01-22 ENCOUNTER — Other Ambulatory Visit: Payer: Self-pay

## 2024-01-22 ENCOUNTER — Other Ambulatory Visit (HOSPITAL_COMMUNITY): Payer: Self-pay

## 2024-01-22 DIAGNOSIS — F411 Generalized anxiety disorder: Secondary | ICD-10-CM | POA: Diagnosis not present

## 2024-01-22 DIAGNOSIS — F331 Major depressive disorder, recurrent, moderate: Secondary | ICD-10-CM | POA: Diagnosis not present

## 2024-01-22 DIAGNOSIS — F3110 Bipolar disorder, current episode manic without psychotic features, unspecified: Secondary | ICD-10-CM | POA: Diagnosis not present

## 2024-01-22 DIAGNOSIS — Z5181 Encounter for therapeutic drug level monitoring: Secondary | ICD-10-CM | POA: Diagnosis not present

## 2024-01-22 DIAGNOSIS — F4310 Post-traumatic stress disorder, unspecified: Secondary | ICD-10-CM | POA: Diagnosis not present

## 2024-01-22 MED ORDER — TRAZODONE HCL 100 MG PO TABS
100.0000 mg | ORAL_TABLET | Freq: Every evening | ORAL | 1 refills | Status: DC | PRN
  Filled 2024-01-22: qty 30, 30d supply, fill #0

## 2024-01-22 MED ORDER — VRAYLAR 1.5 MG PO CAPS
1.5000 mg | ORAL_CAPSULE | Freq: Every day | ORAL | 0 refills | Status: DC
  Filled 2024-01-22: qty 30, 30d supply, fill #0

## 2024-01-22 MED ORDER — VENLAFAXINE HCL ER 75 MG PO CP24
75.0000 mg | ORAL_CAPSULE | Freq: Every morning | ORAL | 1 refills | Status: DC
  Filled 2024-01-22: qty 30, 30d supply, fill #0
  Filled 2024-02-18: qty 30, 30d supply, fill #1

## 2024-01-24 ENCOUNTER — Other Ambulatory Visit (HOSPITAL_COMMUNITY): Payer: Self-pay

## 2024-01-30 ENCOUNTER — Other Ambulatory Visit (HOSPITAL_COMMUNITY): Payer: Self-pay

## 2024-01-31 DIAGNOSIS — F4310 Post-traumatic stress disorder, unspecified: Secondary | ICD-10-CM | POA: Diagnosis not present

## 2024-02-09 MED FILL — Omeprazole Cap Delayed Release 20 MG: ORAL | 30 days supply | Qty: 60 | Fill #0 | Status: AC

## 2024-02-10 ENCOUNTER — Other Ambulatory Visit (HOSPITAL_COMMUNITY): Payer: Self-pay

## 2024-02-10 ENCOUNTER — Other Ambulatory Visit: Payer: Self-pay

## 2024-02-11 ENCOUNTER — Other Ambulatory Visit: Payer: Self-pay

## 2024-02-12 ENCOUNTER — Ambulatory Visit: Payer: Self-pay

## 2024-02-12 ENCOUNTER — Other Ambulatory Visit: Payer: Self-pay | Admitting: Student

## 2024-02-12 ENCOUNTER — Other Ambulatory Visit (HOSPITAL_COMMUNITY): Payer: Self-pay

## 2024-02-12 ENCOUNTER — Ambulatory Visit

## 2024-02-12 VITALS — BP 118/80 | HR 87 | Temp 98.0°F | Ht 62.0 in | Wt 192.6 lb

## 2024-02-12 DIAGNOSIS — I10 Essential (primary) hypertension: Secondary | ICD-10-CM

## 2024-02-12 DIAGNOSIS — E119 Type 2 diabetes mellitus without complications: Secondary | ICD-10-CM | POA: Diagnosis not present

## 2024-02-12 DIAGNOSIS — G43719 Chronic migraine without aura, intractable, without status migrainosus: Secondary | ICD-10-CM | POA: Diagnosis not present

## 2024-02-12 DIAGNOSIS — Z7985 Long-term (current) use of injectable non-insulin antidiabetic drugs: Secondary | ICD-10-CM | POA: Diagnosis not present

## 2024-02-12 DIAGNOSIS — Z7984 Long term (current) use of oral hypoglycemic drugs: Secondary | ICD-10-CM

## 2024-02-12 DIAGNOSIS — K219 Gastro-esophageal reflux disease without esophagitis: Secondary | ICD-10-CM

## 2024-02-12 DIAGNOSIS — E785 Hyperlipidemia, unspecified: Secondary | ICD-10-CM

## 2024-02-12 LAB — POCT GLYCOSYLATED HEMOGLOBIN (HGB A1C): Hemoglobin A1C: 5.2 % (ref 4.0–5.6)

## 2024-02-12 LAB — GLUCOSE, CAPILLARY: Glucose-Capillary: 166 mg/dL — ABNORMAL HIGH (ref 70–99)

## 2024-02-12 MED ORDER — LOSARTAN POTASSIUM-HCTZ 50-12.5 MG PO TABS
1.0000 | ORAL_TABLET | Freq: Every day | ORAL | 3 refills | Status: DC
Start: 2024-02-12 — End: 2024-02-12
  Filled 2024-02-12: qty 90, 90d supply, fill #0

## 2024-02-12 MED ORDER — METHOCARBAMOL 500 MG PO TABS
500.0000 mg | ORAL_TABLET | Freq: Three times a day (TID) | ORAL | 0 refills | Status: DC | PRN
  Filled 2024-02-12 (×2): qty 60, 20d supply, fill #0

## 2024-02-12 MED ORDER — AMLODIPINE BESYLATE 5 MG PO TABS
5.0000 mg | ORAL_TABLET | Freq: Every day | ORAL | 3 refills | Status: DC
  Filled 2024-02-12: qty 90, 90d supply, fill #0

## 2024-02-12 MED ORDER — NURTEC 75 MG PO TBDP
75.0000 mg | ORAL_TABLET | ORAL | 2 refills | Status: DC
  Filled 2024-02-12: qty 30, 60d supply, fill #0
  Filled 2024-03-17 – 2024-03-28 (×4): qty 15, 30d supply, fill #0

## 2024-02-12 MED ORDER — ATORVASTATIN CALCIUM 80 MG PO TABS
80.0000 mg | ORAL_TABLET | Freq: Every day | ORAL | 3 refills | Status: AC
  Filled 2024-02-12 – 2024-06-02 (×2): qty 90, 90d supply, fill #0

## 2024-02-12 MED ORDER — OMEPRAZOLE 20 MG PO CPDR
40.0000 mg | DELAYED_RELEASE_CAPSULE | Freq: Every day | ORAL | 2 refills | Status: DC
  Filled 2024-02-12: qty 60, 30d supply, fill #0

## 2024-02-12 MED ORDER — ATORVASTATIN CALCIUM 80 MG PO TABS
80.0000 mg | ORAL_TABLET | Freq: Every day | ORAL | 3 refills | Status: DC
  Filled 2024-02-12: qty 90, 90d supply, fill #0

## 2024-02-12 MED ORDER — NURTEC 75 MG PO TBDP
75.0000 mg | ORAL_TABLET | ORAL | 1 refills | Status: DC
  Filled 2024-02-12: qty 30, 60d supply, fill #0

## 2024-02-12 MED ORDER — TOPIRAMATE 25 MG PO TABS
75.0000 mg | ORAL_TABLET | Freq: Every day | ORAL | 3 refills | Status: DC
  Filled 2024-02-12: qty 90, 30d supply, fill #0

## 2024-02-12 MED ORDER — LOSARTAN POTASSIUM-HCTZ 50-12.5 MG PO TABS
1.0000 | ORAL_TABLET | Freq: Every day | ORAL | 3 refills | Status: AC
  Filled 2024-02-12: qty 90, 90d supply, fill #0
  Filled 2024-06-29: qty 90, 90d supply, fill #1

## 2024-02-12 MED ORDER — AMLODIPINE BESYLATE 5 MG PO TABS
5.0000 mg | ORAL_TABLET | Freq: Every day | ORAL | 3 refills | Status: AC
  Filled 2024-02-12: qty 90, 90d supply, fill #0
  Filled 2024-06-29: qty 90, 90d supply, fill #1

## 2024-02-12 MED ORDER — ONDANSETRON HCL 4 MG PO TABS
4.0000 mg | ORAL_TABLET | Freq: Three times a day (TID) | ORAL | 2 refills | Status: AC | PRN
  Filled 2024-02-12: qty 15, 5d supply, fill #0

## 2024-02-12 MED ORDER — OMEPRAZOLE 20 MG PO CPDR
40.0000 mg | DELAYED_RELEASE_CAPSULE | Freq: Every day | ORAL | 2 refills | Status: DC
  Filled 2024-02-12 – 2024-03-07 (×2): qty 60, 30d supply, fill #0

## 2024-02-12 MED ORDER — ONDANSETRON HCL 4 MG PO TABS
4.0000 mg | ORAL_TABLET | Freq: Three times a day (TID) | ORAL | 0 refills | Status: DC | PRN
  Filled 2024-02-12: qty 15, 5d supply, fill #0

## 2024-02-12 MED ORDER — METHOCARBAMOL 500 MG PO TABS
500.0000 mg | ORAL_TABLET | Freq: Three times a day (TID) | ORAL | 0 refills | Status: AC | PRN
Start: 2024-02-12 — End: ?
  Filled 2024-02-12: qty 60, 20d supply, fill #0

## 2024-02-12 MED ORDER — TOPIRAMATE 25 MG PO TABS
75.0000 mg | ORAL_TABLET | Freq: Every day | ORAL | 3 refills | Status: DC
  Filled 2024-02-12 – 2024-03-17 (×2): qty 270, 90d supply, fill #0
  Filled 2024-06-29: qty 270, 90d supply, fill #1

## 2024-02-12 NOTE — Progress Notes (Signed)
 The pt is aware of lab result.

## 2024-02-12 NOTE — Assessment & Plan Note (Addendum)
 The patient continues to experience daily migraines. She reports taking Nurtec 75 mg and topiramate  50 mg daily, but notes no significant improvement in the frequency of her migraines. she does report occasional breakthrough episodes that become unmanageable and interfere with her daily activities.  She states that she is generally able to control the headaches with a combination of medications, Tylenol , Robaxin , Nurtec 75 mg, and topiramate  75 mg daily, along with rest and sleep. All current medications were refilled.

## 2024-02-12 NOTE — Assessment & Plan Note (Addendum)
 The patient reports taking Trulicity  0.75 mg weekly on Wednesdays and Farxiga . She denies symptoms of polyuria or polydipsia. Her HbA1c today is 5.2%. She reports one episode of hypoglycemia with a blood glucose level of 56 mg/dL, accompanied by lightheadedness. Additionally, she has been experiencing side effects from Trulicity , including nausea.  Given the low HbA1c, the hypoglycemic episode, and reported side effects, we have decided to discontinue her diabetes medications. She was advised to continue a healthy diet and regular exercise. Follow-up is scheduled in one month to monitor blood glucose levels.

## 2024-02-12 NOTE — Assessment & Plan Note (Addendum)
 Blood Pressure Readings from Last 3 Encounters: 02/12/24: 118/80 11/11/23: 123/75 09/13/23: 121/75 The patient is compliant with her blood pressure medications. She denies chest pain, headache, or visual changes.  Physical Examination: Lungs and heart sounds are clear. Radial and dorsalis pedis pulses are normal bilaterally.  Plan: Refill medications Continue amlodipine  5 mg daily Continue losartan -hydrochlorothiazide  50/12.5 mg daily

## 2024-02-12 NOTE — Progress Notes (Signed)
 CC: Medication Refill  HPI:  [name] is a 43 y.o. female living with a history stated below and presents today for evaluation of her chronic conditions and medication refill. Please see problem based assessment and plan for additional details.  Past Medical History:  Diagnosis Date   Anxiety    Cancer (HCC)    ovarian 2001 and 2010   Depression    Essential hypertension 05/04/2022   Herniated disc, cervical 2011   Hypertension    Migraine    Pityriasis rosea    Uncontrolled type 2 diabetes mellitus with hyperglycemia (HCC) 05/04/2022   URI, acute 08/17/2022    Current Outpatient Medications on File Prior to Visit  Medication Sig Dispense Refill   aspirin  EC 81 MG tablet Take 81 mg by mouth daily. Swallow whole.     cariprazine  (VRAYLAR ) 1.5 MG capsule Take 1 capsule (1.5 mg total) by mouth daily. 30 capsule 0   Continuous Glucose Receiver (DEXCOM G7 RECEIVER) DEVI Use as directed 1 each 12   Continuous Glucose Sensor (FREESTYLE LIBRE 3 SENSOR) MISC Place 1 sensor on the skin every 14 days. Use to check glucose continuously 2 each 2   Dulaglutide  (TRULICITY ) 0.75 MG/0.5ML SOAJ Inject 0.75 mg into the skin once a week. 2 mL 2   EPINEPHrine  0.3 mg/0.3 mL IJ SOAJ injection Inject 0.3 mg into the muscle as needed for anaphylaxis. 1 each 2   FARXIGA  5 MG TABS tablet Take 1 tablet (5 mg total) by mouth daily before breakfast. 30 tablet 11   lidocaine  (LIDODERM ) 5 % Place 1 patch onto the skin daily. Remove & Discard patch within 12 hours or as directed by MD 30 patch 0   linaclotide  (LINZESS ) 145 MCG CAPS capsule Take 1 capsule (145 mcg total) by mouth daily before breakfast. 30 capsule 5   traZODone  (DESYREL ) 100 MG tablet Take 1/2 tablet (50 mg) to 1 tablet (100 mg total) by mouth at bedtime as needed for sleep. 30 tablet 1   venlafaxine  XR (EFFEXOR -XR) 75 MG 24 hr capsule Take 1 capsule (75 mg total) by mouth every morning. 30 capsule 1   No current  facility-administered medications on file prior to visit.    Family History  Problem Relation Age of Onset   Hyperlipidemia Mother    Hypertension Mother    Diabetes Mother    Stroke Mother    Cerebral aneurysm Mother    Cerebral aneurysm Father    Cancer Father    Stroke Father    Breast cancer Maternal Aunt 28   Aneurysm Cousin    Heart Problems Cousin    Heart Problems Cousin    Heart Problems Cousin     Social History   Socioeconomic History   Marital status: Single    Spouse name: Not on file   Number of children: Not on file   Years of education: Not on file   Highest education level: Not on file  Occupational History   Not on file  Tobacco Use   Smoking status: Every Day    Current packs/day: 0.50    Average packs/day: 0.5 packs/day for 25.7 years (12.8 ttl pk-yrs)    Types: Cigarettes    Start date: 06/06/1998   Smokeless tobacco: Never   Tobacco comments:    3 cigs per day  Vaping Use   Vaping status: Never Used  Substance and Sexual Activity   Alcohol use: Not Currently    Comment: rare   Drug use:  Yes    Types: Marijuana    Comment: uses to eat   Sexual activity: Not Currently    Partners: Male  Other Topics Concern   Not on file  Social History Narrative   Caffiene rare   Working:  not right now,  trying to get job   Lives with partner.   Social Drivers of Health   Financial Resource Strain: Medium Risk (09/13/2022)   Overall Financial Resource Strain (CARDIA)    Difficulty of Paying Living Expenses: Somewhat hard  Food Insecurity: Food Insecurity Present (09/13/2022)   Hunger Vital Sign    Worried About Running Out of Food in the Last Year: Often true    Ran Out of Food in the Last Year: Often true  Transportation Needs: No Transportation Needs (09/13/2022)   PRAPARE - Administrator, Civil Service (Medical): No    Lack of Transportation (Non-Medical): No  Physical Activity: Insufficiently Active (02/12/2024)   Exercise Vital Sign     Days of Exercise per Week: 3 days    Minutes of Exercise per Session: 30 min  Stress: Stress Concern Present (09/13/2022)   [employer] of Occupational Health - Occupational Stress Questionnaire    Feeling of Stress : Very much  Social Connections: Moderately Integrated (09/13/2022)   Social Connection and Isolation Panel    Frequency of Communication with Friends and Family: Once a week    Frequency of Social Gatherings with Friends and Family: Once a week    Attends Religious Services: 1 to 4 times per year    Active Member of [employer] or Organizations: No    Attends Banker Meetings: 1 to 4 times per year    Marital Status: Living with partner  Intimate Partner Violence: Not At Risk (09/13/2022)   Humiliation, Afraid, Rape, and Kick questionnaire    Fear of Current or Ex-Partner: No    Emotionally Abused: No    Physically Abused: No    Sexually Abused: No    Review of Systems: ROS negative except for what is noted on the assessment and plan.  Vitals:   02/12/24 1318  BP: 118/80  Pulse: 87  Temp: 98 F (36.7 C)  TempSrc: Oral  SpO2: 98%  Weight: 192 lb 9.6 oz (87.4 kg)  Height: 5' 2 (1.575 m)    Physical Exam  Physical Exam: Constitutional: well-appearing, in no acute distress Cardiovascular: regular rate and rhythm, no m/r/g Pulmonary/Chest: normal work of breathing on room air, lungs clear to auscultation bilaterally Abdominal: soft, non-tender, non-distended   Assessment & Plan:   Essential hypertension Blood Pressure Readings from Last 3 Encounters: 02/12/24: 118/80 11/11/23: 123/75 09/13/23: 121/75 The patient is compliant with her blood pressure medications. She denies chest pain, headache, or visual changes.  Physical Examination: Lungs and heart sounds are clear. Radial and dorsalis pedis pulses are normal bilaterally.  Plan: Refill medications Continue amlodipine  5 mg daily Continue losartan -hydrochlorothiazide  50/12.5 mg  daily  Type 2 diabetes mellitus (HCC) The patient reports taking Trulicity  0.75 mg weekly on Wednesdays and Farxiga . She denies symptoms of polyuria or polydipsia. Her HbA1c today is 5.2%. She reports one episode of hypoglycemia with a blood glucose level of 56 mg/dL, accompanied by lightheadedness. Additionally, she has been experiencing side effects from Trulicity , including nausea.  Given the low HbA1c, the hypoglycemic episode, and reported side effects, we have decided to discontinue her diabetes medications. She was advised to continue a healthy diet and regular exercise. Follow-up is scheduled in  one month to monitor blood glucose levels.  Migraine The patient continues to experience daily migraines. She reports taking Nurtec 75 mg and topiramate  50 mg daily, but notes no significant improvement in the frequency of her migraines. she does report occasional breakthrough episodes that become unmanageable and interfere with her daily activities.  She states that she is generally able to control the headaches with a combination of medications, Tylenol , Robaxin , Nurtec 75 mg, and topiramate  75 mg daily, along with rest and sleep. All current medications were refilled.   Patient seen with Dr. CHARLENA Rosan Armando Bernadine M.D [facility] Internal Medicine, PGY-1 Phone: [phone] Date 02/12/2024 Time 5:45 PM

## 2024-02-12 NOTE — Patient Instructions (Signed)
 Thank you, [name] Thank you for allowing us  to provide your care today.  During today's visit, we reviewed the medications you are currently taking. We refilled all of your prescriptions for hypertension and migraine management.  Given that your most recent HbA1c was 5.2% and you have been experiencing some heartburn, we made the decision to discontinue both Farxiga  and Trulicity  at this time.  There is also a referral in place to [facility]. I appreciate you calling them to follow up on that referral. Please let me know if you need any assistance with this or have any other concerns.  I have ordered the following labs for you:   Lab Orders         Glucose, capillary         POC Hbg A1C      Tests ordered today:    Referrals ordered today:   Referral Orders  No referral(s) requested today     I have ordered the following medication/changed the following medications:   Stop the following medications: Medications Discontinued During This Encounter  Medication Reason   venlafaxine  XR (EFFEXOR -XR) 150 MG 24 hr capsule Change in therapy     Start the following medications: No orders of the defined types were placed in this encounter.    Follow up: 2-3 months   Remember:   Should you have any questions or concerns please call the internal medicine clinic at [phone].    Armando Rossetti, M.D [facility]

## 2024-02-17 NOTE — Progress Notes (Signed)
Internal Medicine Clinic Attending  I was physically present during the key portions of the resident provided service and participated in the medical decision making of patient's management care. I reviewed pertinent patient test results.  The assessment, diagnosis, and plan were formulated together and I agree with the documentation in the resident's note.  Gust Rung, DO

## 2024-02-27 ENCOUNTER — Encounter: Payer: Self-pay | Admitting: Physician Assistant

## 2024-02-27 DIAGNOSIS — H35033 Hypertensive retinopathy, bilateral: Secondary | ICD-10-CM | POA: Diagnosis not present

## 2024-02-27 DIAGNOSIS — F4310 Post-traumatic stress disorder, unspecified: Secondary | ICD-10-CM | POA: Diagnosis not present

## 2024-02-27 DIAGNOSIS — E119 Type 2 diabetes mellitus without complications: Secondary | ICD-10-CM | POA: Diagnosis not present

## 2024-02-27 DIAGNOSIS — H1045 Other chronic allergic conjunctivitis: Secondary | ICD-10-CM | POA: Diagnosis not present

## 2024-02-27 DIAGNOSIS — H2513 Age-related nuclear cataract, bilateral: Secondary | ICD-10-CM | POA: Diagnosis not present

## 2024-02-27 LAB — HM DIABETES EYE EXAM

## 2024-03-03 ENCOUNTER — Other Ambulatory Visit: Payer: Self-pay

## 2024-03-03 ENCOUNTER — Other Ambulatory Visit (HOSPITAL_COMMUNITY): Payer: Self-pay

## 2024-03-03 DIAGNOSIS — F411 Generalized anxiety disorder: Secondary | ICD-10-CM | POA: Diagnosis not present

## 2024-03-03 DIAGNOSIS — F331 Major depressive disorder, recurrent, moderate: Secondary | ICD-10-CM | POA: Diagnosis not present

## 2024-03-03 DIAGNOSIS — F4310 Post-traumatic stress disorder, unspecified: Secondary | ICD-10-CM | POA: Diagnosis not present

## 2024-03-03 MED ORDER — BUPROPION HCL ER (XL) 150 MG PO TB24
150.0000 mg | ORAL_TABLET | Freq: Every morning | ORAL | 0 refills | Status: DC
  Filled 2024-03-03 – 2024-03-05 (×2): qty 30, 30d supply, fill #0

## 2024-03-03 MED ORDER — VENLAFAXINE HCL ER 37.5 MG PO CP24
37.5000 mg | ORAL_CAPSULE | Freq: Every day | ORAL | 0 refills | Status: DC
  Filled 2024-03-03 – 2024-03-05 (×3): qty 7, 7d supply, fill #0

## 2024-03-05 ENCOUNTER — Other Ambulatory Visit (HOSPITAL_COMMUNITY): Payer: Self-pay

## 2024-03-06 ENCOUNTER — Other Ambulatory Visit (HOSPITAL_COMMUNITY): Payer: Self-pay

## 2024-03-07 ENCOUNTER — Other Ambulatory Visit: Payer: Self-pay

## 2024-03-07 ENCOUNTER — Other Ambulatory Visit (HOSPITAL_COMMUNITY): Payer: Self-pay

## 2024-03-08 ENCOUNTER — Other Ambulatory Visit (HOSPITAL_COMMUNITY): Payer: Self-pay

## 2024-03-08 MED ORDER — CARIPRAZINE HCL 1.5 MG PO CAPS
1.5000 mg | ORAL_CAPSULE | Freq: Every day | ORAL | 0 refills | Status: DC
  Filled 2024-03-08: qty 30, 30d supply, fill #0

## 2024-03-09 ENCOUNTER — Other Ambulatory Visit (HOSPITAL_COMMUNITY): Payer: Self-pay

## 2024-03-09 ENCOUNTER — Telehealth: Payer: Self-pay

## 2024-03-09 ENCOUNTER — Encounter (HOSPITAL_COMMUNITY): Payer: Self-pay

## 2024-03-09 ENCOUNTER — Other Ambulatory Visit: Payer: Self-pay

## 2024-03-09 NOTE — Telephone Encounter (Signed)
 Prior Authorization for patient (Farxiga  5MG  tablets) came through on cover my meds was submitted with last office notes and labs awaiting approval or denial.  XZB:[id number]

## 2024-03-09 NOTE — Telephone Encounter (Signed)
 Regina Cantu (Key: [id number]) PA Case ID #: [id number] Rx #: [id number] Need Help? Call us  at [phone] Outcome Approved today by [employer] 2017 Approved. FARXIGA  5MG  Tablet is approved from 03/09/2024 to 03/09/2025. Effective Date: 03/09/2024 Authorization Expiration Date: 03/09/2025 Drug Farxiga  5MG  tablets ePA cloud logo Form [employer] Electronic Prior Authorization Form

## 2024-03-17 ENCOUNTER — Other Ambulatory Visit (HOSPITAL_COMMUNITY): Payer: Self-pay

## 2024-03-17 ENCOUNTER — Other Ambulatory Visit: Payer: Self-pay

## 2024-03-18 ENCOUNTER — Telehealth: Payer: Self-pay

## 2024-03-18 ENCOUNTER — Other Ambulatory Visit (HOSPITAL_COMMUNITY): Payer: Self-pay

## 2024-03-18 ENCOUNTER — Encounter (HOSPITAL_COMMUNITY): Payer: Self-pay

## 2024-03-18 NOTE — Telephone Encounter (Signed)
 Regina Cantu (Key: [id number]) PA Case ID #: [id number] Rx #: [id number] Need Help? Call us  at [phone] Outcome Denied today by [employer] 2017 Denied Drug Nurtec 75MG  dispersible tablets ePA cloud logo Form [employer] Electronic Prior Authorization Form Original Claim Info 75  Awaiting additional information on why this request has been denied.

## 2024-03-18 NOTE — Telephone Encounter (Signed)
 Prior Authorization for patient (Nurtec 75MG  dispersible tablets) came through on cover my meds was submitted with last office notes awaiting approval or denial.  KEY: [id number]

## 2024-03-23 ENCOUNTER — Other Ambulatory Visit (HOSPITAL_COMMUNITY): Payer: Self-pay

## 2024-03-23 ENCOUNTER — Encounter (HOSPITAL_COMMUNITY): Payer: Self-pay

## 2024-03-25 ENCOUNTER — Other Ambulatory Visit (HOSPITAL_COMMUNITY): Payer: Self-pay

## 2024-03-26 DIAGNOSIS — F4310 Post-traumatic stress disorder, unspecified: Secondary | ICD-10-CM | POA: Diagnosis not present

## 2024-03-28 ENCOUNTER — Other Ambulatory Visit (HOSPITAL_COMMUNITY): Payer: Self-pay

## 2024-03-29 MED ORDER — BUPROPION HCL ER (XL) 150 MG PO TB24
150.0000 mg | ORAL_TABLET | Freq: Every morning | ORAL | 0 refills | Status: DC
  Filled 2024-03-29: qty 30, 30d supply, fill #0

## 2024-03-29 MED ORDER — CARIPRAZINE HCL 1.5 MG PO CAPS
1.5000 mg | ORAL_CAPSULE | Freq: Every day | ORAL | 0 refills | Status: DC
  Filled 2024-03-29: qty 30, 30d supply, fill #0

## 2024-03-30 ENCOUNTER — Other Ambulatory Visit: Payer: Self-pay

## 2024-03-30 ENCOUNTER — Other Ambulatory Visit (HOSPITAL_COMMUNITY): Payer: Self-pay

## 2024-03-31 NOTE — Progress Notes (Unsigned)
 Ellouise Console, PA-C [address] [place], [place]  [address] Phone: [phone]   Primary Care Physician: Bernadine Manos, MD  Primary Gastroenterologist:  Ellouise Console, PA-C / Victory Brand III, MD   Chief Complaint: Follow-up GERD, hepatic steatosis, constipation, elevated LFTs.   HPI:   Regina Cantu is a 43 y.o. female who returns for follow-up of GERD, Constipation, hepatic steatosis, and elevated LFTs.  I last saw patient at [facility] 03/2023.  She was taking Amitiza  24 mcg twice daily for constipation and omeprazole  20 Mg once daily for GERD.  She was switched to Linzess  145 mcg daily for constipation which works better than Amitiza .  Omeprazole  was increased to 20 mg twice daily for acid reflux.  She continues to have constipation and breakthrough GERD symptoms.  Currently having bowel movement every 4 days with hard stools.  Reports breakthrough acid reflux twice per week on current medication.  She has intermittent upper abdominal pain located in the epigastrium, RUQ, and LUQ.  She feels shaky today and is worried about hypoglycemia.  Has history of type 2 diabetes.  Denies melena, hematochezia, nausea, vomiting, or other GI symptoms.  09/2023 first screening colonoscopy by Dr. Jinny: Normal.  No polyps.  Good prep.  10-year repeat.  02/2023 RUQ ultrasound: 1. Increased hepatic parenchymal echogenicity suggestive of steatosis. 2. No cholelithiasis or sonographic evidence for acute cholecystitis.  Labs: 02/01/23 -mildly elevated alk phos 131, ALT 42.  Normal AST 19, total bilirubin 0.3.  LFTs greatly improved.  Normal iron, ceruloplasmin, immunoglobulins, alpha 1 antitrypsin.  Negative ANA, AMA, ASMA, and celiac.  Negative HIV, hepatitis C, and hepatitis B.  Not immune to hepatitis B.  Positive hepatitis A total antibody.   Labs: 11/29/2022 were significant for moderately elevated liver transaminases: AST 142, ALT 301.  Normal bilirubin 0.6, alk phos 102.  Normal CBC  with hemoglobin 15.1, WBC 8.8, platelets 377.  Normal kidney function.  Glucose 128.  HCV Neg. 09/2022.   CT chest abdomen and pelvis with contrast 01/2022 showed hepatic steatosis and normal gallbladder.  Normal pancreas.  Normal intestines.  She had pneumonia which was treated.  No acute abnormality in the abdomen or pelvis.   PMH: significant for diabetes, hypertension, anxiety, depression, ovarian cancer in 2010 and 2001.  History of tobacco and marijuana use.  She stopped all alcohol 11/2022.  Current Outpatient Medications  Medication Sig Dispense Refill   amLODipine  (NORVASC ) 5 MG tablet Take 1 tablet (5 mg total) by mouth daily. 90 tablet 3   atorvastatin  (LIPITOR) 80 MG tablet Take 1 tablet (80 mg total) by mouth daily. 90 tablet 3   buPROPion  (WELLBUTRIN  XL) 150 MG 24 hr tablet Take 1  tablet by mouth every morning 30 tablet 0   cariprazine  (VRAYLAR ) 1.5 MG capsule Take 1 capsule (1.5 mg total) by mouth daily. 30 capsule 0   EPINEPHrine  0.3 mg/0.3 mL IJ SOAJ injection Inject 0.3 mg into the muscle as needed for anaphylaxis. 1 each 2   famotidine  (PEPCID ) 40 MG tablet Take 1 tablet (40 mg total) by mouth at bedtime. 90 tablet 3   FARXIGA  5 MG TABS tablet Take 1 tablet (5 mg total) by mouth daily before breakfast. 30 tablet 11   lidocaine  (LIDODERM ) 5 % Place 1 patch onto the skin daily. Remove & Discard patch within 12 hours or as directed by MD 30 patch 0   linaclotide  (LINZESS ) 290 MCG CAPS capsule Take 1 capsule (290 mcg total)  by mouth daily before breakfast. 90 capsule 3   losartan -hydrochlorothiazide  (HYZAAR ) 50-12.5 MG tablet Take 1 tablet by mouth daily. 90 tablet 3   methocarbamol  (ROBAXIN ) 500 MG tablet Take 1 tablet (500 mg total) by mouth every 8 (eight) hours as needed (muscle spasms, pain). 60 tablet 0   ondansetron  (ZOFRAN ) 4 MG tablet Take 1 tablet (4 mg total) by mouth every 8 (eight) hours as needed for nausea or vomiting. 15 tablet 2   pantoprazole  (PROTONIX ) 40 MG tablet  Take 1 tablet (40 mg total) by mouth daily. 90 tablet 3   Rimegepant Sulfate  (NURTEC) 75 MG TBDP Take 1 tablet (75 mg total) by mouth every other day. 30 tablet 2   topiramate  (TOPAMAX ) 25 MG tablet Take 3 tablets (75 mg total) by mouth at bedtime. 270 tablet 3   traZODone  (DESYREL ) 100 MG tablet Take 1/2 tablet (50 mg) to 1 tablet (100 mg total) by mouth at bedtime as needed for sleep. 30 tablet 1   No current facility-administered medications for this visit.    Allergies as of 04/01/2024 - Review Complete 04/01/2024  Allergen Reaction Noted   Bee venom Anaphylaxis 06/01/2022   Mushroom extract complex (obsolete) Anaphylaxis 05/04/2022   Wasp venom Anaphylaxis 06/01/2022   Latex Rash 06/01/2022   Penicillins Hives, Nausea And Vomiting, and Rash 03/04/2013    Past Medical History:  Diagnosis Date   Anxiety    Cancer (HCC)    ovarian 2001 and 2010   Depression    Essential hypertension 05/04/2022   Herniated disc, cervical 2011   Hypertension    Migraine    Pityriasis rosea    Uncontrolled type 2 diabetes mellitus with hyperglycemia (HCC) 05/04/2022   URI, acute 08/17/2022    Past Surgical History:  Procedure Laterality Date   ABDOMINAL HYSTERECTOMY     COLONOSCOPY WITH PROPOFOL  N/A 09/10/2023   Procedure: COLONOSCOPY WITH PROPOFOL ;  Surgeon: Jinny Carmine, MD;  Location: [facility];  Service: Endoscopy;  Laterality: N/A;   OVARIAN CANCER      Review of Systems:    All systems reviewed and negative except where noted in HPI.    Physical Exam:  BP 114/70   Pulse 97   Ht 5' 2 (1.575 m)   Wt 196 lb 3.2 oz (89 kg)   BMI 35.89 kg/m  No LMP recorded. Patient has had a hysterectomy.  General: Well-nourished, well-developed, obese, in no acute distress.  Lungs: Clear to auscultation bilaterally. Non-labored. Heart: Regular rate and rhythm, no murmurs rubs or gallops.  Abdomen: Bowel sounds are normal; Abdomen is Soft; No hepatosplenomegaly, masses or hernias; mild  RUQ, epigastric, and LUQ abdominal Tenderness; no lower abdominal tenderness.  No guarding or rebound tenderness. Neuro: Alert and oriented x 3.  Grossly intact.  Psych: Alert and cooperative, anxious and tearful mood and affect.  Imaging Studies: See HPI.  Labs: CBC    Component Value Date/Time   WBC 9.8 08/09/2023 1749   RBC 5.35 (H) 08/09/2023 1749   HGB 16.0 (H) 08/09/2023 1749   HCT 46.7 (H) 08/09/2023 1749   PLT 397 08/09/2023 1749   MCV 87.3 08/09/2023 1749   MCH 29.9 08/09/2023 1749   MCHC 34.3 08/09/2023 1749   RDW 12.5 08/09/2023 1749   LYMPHSABS 3.2 11/29/2022 0949   MONOABS 0.6 11/29/2022 0949   EOSABS 0.2 11/29/2022 0949   BASOSABS 0.1 11/29/2022 0949    CMP     Component Value Date/Time   NA 137 08/09/2023 1749  NA 136 03/18/2023 1050   K 3.6 08/09/2023 1749   CL 101 08/09/2023 1749   CO2 27 08/09/2023 1749   GLUCOSE 94 08/09/2023 1749   BUN 15 08/09/2023 1749   BUN 12 03/18/2023 1050   CREATININE 0.76 08/09/2023 1749   CALCIUM  9.2 08/09/2023 1749   PROT 6.6 03/19/2023 0834   ALBUMIN 4.2 03/19/2023 0834   AST 15 03/19/2023 0834   ALT 40 (H) 03/19/2023 0834   ALKPHOS 135 (H) 03/19/2023 0834   BILITOT 0.3 03/19/2023 0834   GFRNONAA >60 08/09/2023 1749   GFRAA >60 09/25/2018 0644     Assessment and Plan:   Regina Cantu is a 43 y.o. y/o female returns for follow-up of:  Persistent epigastric Pain, RUQ and LUQ pain - lab: CBC, CMP - Scheduling EGD I discussed risks of EGD with patient to include risk of bleeding, perforation, and risk of sedation.  Patient expressed understanding and agrees to proceed with EGD.   2.  GERD: Not controlled on Prilosec 20 Mg twice daily. - Stop Prilosec - Start Pantoprazole  40mg  QAM - Start Famotidine  40mg  at bedtime - Recommend Lifestyle Modifications to prevent Acid Reflux.  Rec. Avoid coffee, sodas, peppermint, garlic, onions, alcohol, citrus fruits, chocolate, tomatoes, fatty and spicey foods.  Avoid  eating 2-3 hours before bedtime.    3.  Chronic constipation - Increase Linzess  290 MCG 1 tablet once daily, sample and prescription given.  4.  Hepatic steatosis - Hepatic panel  Fibrosis 4 Score = .27 (Low risk)        Interpretation for patients with NAFLD          <1.30       -  F0-F1 (Low risk)          1.30-2.67 -  Indeterminate           >2.67      -  F3-F4 (High risk)     Validated for ages 60-65 - Recommend low-fat diet, exercise, weight loss, and avoid alcohol.  Ellouise Console, PA-C  Follow up 4 weeks after EGD procedure.

## 2024-04-01 ENCOUNTER — Encounter: Payer: Self-pay | Admitting: Physician Assistant

## 2024-04-01 ENCOUNTER — Ambulatory Visit: Admitting: Physician Assistant

## 2024-04-01 ENCOUNTER — Other Ambulatory Visit (HOSPITAL_COMMUNITY): Payer: Self-pay

## 2024-04-01 ENCOUNTER — Other Ambulatory Visit (INDEPENDENT_AMBULATORY_CARE_PROVIDER_SITE_OTHER)

## 2024-04-01 ENCOUNTER — Ambulatory Visit: Payer: Self-pay | Admitting: Physician Assistant

## 2024-04-01 VITALS — BP 114/70 | HR 97 | Ht 62.0 in | Wt 196.2 lb

## 2024-04-01 DIAGNOSIS — R1013 Epigastric pain: Secondary | ICD-10-CM

## 2024-04-01 DIAGNOSIS — K760 Fatty (change of) liver, not elsewhere classified: Secondary | ICD-10-CM | POA: Diagnosis not present

## 2024-04-01 DIAGNOSIS — K5904 Chronic idiopathic constipation: Secondary | ICD-10-CM

## 2024-04-01 DIAGNOSIS — K219 Gastro-esophageal reflux disease without esophagitis: Secondary | ICD-10-CM | POA: Diagnosis not present

## 2024-04-01 DIAGNOSIS — K581 Irritable bowel syndrome with constipation: Secondary | ICD-10-CM

## 2024-04-01 LAB — CBC WITH DIFFERENTIAL/PLATELET
Basophils Absolute: 0 K/uL (ref 0.0–0.1)
Basophils Relative: 0.3 % (ref 0.0–3.0)
Eosinophils Absolute: 0 K/uL (ref 0.0–0.7)
Eosinophils Relative: 0 % (ref 0.0–5.0)
HCT: 42.5 % (ref 36.0–46.0)
Hemoglobin: 14.6 g/dL (ref 12.0–15.0)
Lymphocytes Relative: 40.3 % (ref 12.0–46.0)
Lymphs Abs: 3.3 K/uL (ref 0.7–4.0)
MCHC: 34.3 g/dL (ref 30.0–36.0)
MCV: 87.2 fl (ref 78.0–100.0)
Monocytes Absolute: 0.6 K/uL (ref 0.1–1.0)
Monocytes Relative: 7.5 % (ref 3.0–12.0)
Neutro Abs: 4.2 K/uL (ref 1.4–7.7)
Neutrophils Relative %: 51.9 % (ref 43.0–77.0)
Platelets: 375 K/uL (ref 150.0–400.0)
RBC: 4.88 Mil/uL (ref 3.87–5.11)
RDW: 12.6 % (ref 11.5–15.5)
WBC: 8.1 K/uL (ref 4.0–10.5)

## 2024-04-01 LAB — COMPREHENSIVE METABOLIC PANEL WITH GFR
ALT: 44 U/L — ABNORMAL HIGH (ref 0–35)
AST: 17 U/L (ref 0–37)
Albumin: 4.5 g/dL (ref 3.5–5.2)
Alkaline Phosphatase: 102 U/L (ref 39–117)
BUN: 15 mg/dL (ref 6–23)
CO2: 27 meq/L (ref 19–32)
Calcium: 9 mg/dL (ref 8.4–10.5)
Chloride: 100 meq/L (ref 96–112)
Creatinine, Ser: 0.84 mg/dL (ref 0.40–1.20)
GFR: 83.87 mL/min (ref >60.00)
Glucose, Bld: 165 mg/dL — ABNORMAL HIGH (ref 70–99)
Potassium: 3.6 meq/L (ref 3.5–5.1)
Sodium: 138 meq/L (ref 135–145)
Total Bilirubin: 0.4 mg/dL (ref 0.2–1.2)
Total Protein: 7.5 g/dL (ref 6.0–8.3)

## 2024-04-01 MED ORDER — LINACLOTIDE 290 MCG PO CAPS
290.0000 ug | ORAL_CAPSULE | Freq: Every day | ORAL | 3 refills | Status: DC
  Filled 2024-04-01: qty 90, 90d supply, fill #0

## 2024-04-01 MED ORDER — PANTOPRAZOLE SODIUM 40 MG PO TBEC
40.0000 mg | DELAYED_RELEASE_TABLET | Freq: Every day | ORAL | 3 refills | Status: DC
  Filled 2024-04-01: qty 90, 90d supply, fill #0

## 2024-04-01 MED ORDER — FAMOTIDINE 40 MG PO TABS
40.0000 mg | ORAL_TABLET | Freq: Every day | ORAL | 3 refills | Status: DC
  Filled 2024-04-01: qty 90, 90d supply, fill #0

## 2024-04-01 NOTE — Patient Instructions (Addendum)
 Your provider has requested that you go to the basement level for lab work before leaving today. Press B on the elevator. The lab is located at the first door on the left as you exit the elevator.  - Stop Prilosec. - Start Pantoprazole  40mg  1 tablet daily before breakfast. - Start Famotidine  40mg  be1 tablet daily before bedtime. - Recommend Lifestyle Modifications to prevent Acid Reflux.  Rec. Avoid coffee, sodas, peppermint, garlic, onions, alcohol, citrus fruits, chocolate, tomatoes, fatty and spicey foods.  Avoid eating 2-3 hours before bedtime.    - Increase Linzess  to 290mcg 1 capsule once daily - prescription sent to pharmacy.  You have been scheduled for an Endoscopy. Please follow written instructions given to you at your visit today.  If you use inhalers (even only as needed), please bring them with you on the day of your procedure.  If you take any of the following medications, they will need to be adjusted prior to your procedure:   DO NOT TAKE 7 DAYS PRIOR TO TEST- Trulicity  (dulaglutide ) Ozempic , Wegovy  (semaglutide ) Mounjaro (tirzepatide) Bydureon Bcise (exanatide extended release)  DO NOT TAKE 1 DAY PRIOR TO YOUR TEST Rybelsus  (semaglutide ) Adlyxin (lixisenatide) Victoza (liraglutide) Byetta (exanatide) ___________________________________________________________________________  Please follow up sooner if symptoms increase or worsen  Due to recent changes in healthcare laws, you may see the results of your imaging and laboratory studies on MyChart before your provider has had a chance to review them.  We understand that in some cases there may be results that are confusing or concerning to you. Not all laboratory results come back in the same time frame and the provider may be waiting for multiple results in order to interpret others.  Please give us  48 hours in order for your provider to thoroughly review all the results before contacting the office for clarification  of your results.   Thank you for trusting me with your gastrointestinal care!   Ellouise Console, PA-C _______________________________________________________  If your blood pressure at your visit was 140/90 or greater, please contact your primary care physician to follow up on this.  _______________________________________________________  If you are age 19 or older, your body mass index should be between 23-30. Your Body mass index is 35.89 kg/m. If this is out of the aforementioned range listed, please consider follow up with your Primary Care Provider.  If you are age 76 or younger, your body mass index should be between 19-25. Your Body mass index is 35.89 kg/m. If this is out of the aformentioned range listed, please consider follow up with your Primary Care Provider.   ________________________________________________________  The [facility] providers would like to encourage you to use MYCHART to communicate with providers for non-urgent requests or questions.  Due to long hold times on the telephone, sending your provider a message by [facility] may be a faster and more efficient way to get a response.  Please allow 48 business hours for a response.  Please remember that this is for non-urgent requests.  _______________________________________________________

## 2024-04-02 NOTE — Progress Notes (Signed)
 ____________________________________________________________  Attending physician addendum:  Thank you for sending this case to me. I have reviewed the entire note and agree with the plan.   Victory Brand, MD  ____________________________________________________________

## 2024-04-03 ENCOUNTER — Other Ambulatory Visit (HOSPITAL_COMMUNITY): Payer: Self-pay

## 2024-04-03 ENCOUNTER — Other Ambulatory Visit: Payer: Self-pay

## 2024-04-03 ENCOUNTER — Other Ambulatory Visit (HOSPITAL_BASED_OUTPATIENT_CLINIC_OR_DEPARTMENT_OTHER): Payer: Self-pay

## 2024-04-03 DIAGNOSIS — F4310 Post-traumatic stress disorder, unspecified: Secondary | ICD-10-CM | POA: Diagnosis not present

## 2024-04-03 DIAGNOSIS — F411 Generalized anxiety disorder: Secondary | ICD-10-CM | POA: Diagnosis not present

## 2024-04-03 DIAGNOSIS — F331 Major depressive disorder, recurrent, moderate: Secondary | ICD-10-CM | POA: Diagnosis not present

## 2024-04-03 MED ORDER — BUSPIRONE HCL 5 MG PO TABS
5.0000 mg | ORAL_TABLET | Freq: Two times a day (BID) | ORAL | 0 refills | Status: DC
  Filled 2024-04-03: qty 60, 30d supply, fill #0

## 2024-04-03 MED ORDER — TRAZODONE HCL 100 MG PO TABS
50.0000 mg | ORAL_TABLET | Freq: Every evening | ORAL | 1 refills | Status: DC | PRN
  Filled 2024-04-03: qty 30, 30d supply, fill #0

## 2024-04-03 MED ORDER — TRAZODONE HCL 100 MG PO TABS
50.0000 mg | ORAL_TABLET | Freq: Every day | ORAL | 1 refills | Status: DC
  Filled 2024-04-03: qty 30, 30d supply, fill #0

## 2024-04-09 DIAGNOSIS — F4310 Post-traumatic stress disorder, unspecified: Secondary | ICD-10-CM | POA: Diagnosis not present

## 2024-04-09 DIAGNOSIS — F331 Major depressive disorder, recurrent, moderate: Secondary | ICD-10-CM | POA: Diagnosis not present

## 2024-04-13 ENCOUNTER — Ambulatory Visit: Admitting: Gastroenterology

## 2024-04-13 ENCOUNTER — Encounter: Payer: Self-pay | Admitting: Gastroenterology

## 2024-04-13 VITALS — BP 129/77 | HR 88 | Temp 97.8°F | Resp 14 | Ht 62.0 in | Wt 196.0 lb

## 2024-04-13 DIAGNOSIS — R1013 Epigastric pain: Secondary | ICD-10-CM | POA: Diagnosis not present

## 2024-04-13 DIAGNOSIS — K219 Gastro-esophageal reflux disease without esophagitis: Secondary | ICD-10-CM | POA: Diagnosis not present

## 2024-04-13 DIAGNOSIS — R12 Heartburn: Secondary | ICD-10-CM

## 2024-04-13 DIAGNOSIS — K2100 Gastro-esophageal reflux disease with esophagitis, without bleeding: Secondary | ICD-10-CM

## 2024-04-13 DIAGNOSIS — K2289 Other specified disease of esophagus: Secondary | ICD-10-CM | POA: Diagnosis not present

## 2024-04-13 DIAGNOSIS — J381 Polyp of vocal cord and larynx: Secondary | ICD-10-CM | POA: Diagnosis not present

## 2024-04-13 DIAGNOSIS — K3189 Other diseases of stomach and duodenum: Secondary | ICD-10-CM | POA: Diagnosis not present

## 2024-04-13 MED ORDER — SODIUM CHLORIDE 0.9 % IV SOLN: 500.0000 mL | INTRAVENOUS | Status: AC

## 2024-04-13 NOTE — Progress Notes (Signed)
 Called to room to assist during endoscopic procedure.  Patient ID and intended procedure confirmed with present staff. Received instructions for my participation in the procedure from the performing physician.

## 2024-04-13 NOTE — Progress Notes (Signed)
 Pt's states no medical or surgical changes since previsit or office visit.

## 2024-04-13 NOTE — Progress Notes (Signed)
 Vss nad trans to pacu

## 2024-04-13 NOTE — Patient Instructions (Addendum)
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  If the procedure report does not answer your questions, please call your gastroenterologist to clarify.  If you requested that your care partner not be given the details of your procedure findings, then the procedure report has been included in a sealed envelope for you to review at your convenience later.  YOU SHOULD EXPECT: Some feelings of bloating in the abdomen. Passage of more gas than usual.  Walking can help get rid of the air that was put into your GI tract during the procedure and reduce the bloating. If you had a lower endoscopy (such as a colonoscopy or flexible sigmoidoscopy) you may notice spotting of blood in your stool or on the toilet paper. If you underwent a bowel prep for your procedure, you may not have a normal bowel movement for a few days.  Please Note:  You might notice some irritation and congestion in your nose or some drainage.  This is from the oxygen used during your procedure.  There is no need for concern and it should clear up in a day or so.  SYMPTOMS TO REPORT IMMEDIATELY:   Following upper endoscopy (EGD)  Vomiting of blood or coffee ground material  New chest pain or pain under the shoulder blades  Painful or persistently difficult swallowing  New shortness of breath  Fever of 100F or higher  Black, tarry-looking stools  For urgent or emergent issues, a gastroenterologist can be reached at any hour by calling [phone]. Do not use MyChart messaging for urgent concerns.    DIET:  We do recommend a small meal at first, but then you may proceed to your regular diet.  Drink plenty of fluids but you should avoid  alcoholic beverages for 24 hours.  MEDICATIONS: Continue present medications.    ACTIVITY:  You should plan to take it easy for the rest of today and you should NOT DRIVE or use heavy machinery until tomorrow (because of the sedation medicines used during the test).    FOLLOW UP: Our staff will call the number listed on your records the next business day following your procedure.  We will call around 7:15- 8:00 am to check on you and address any questions or concerns that you may have regarding the information given to you following your procedure. If we do not reach you, we will leave a message.     If any biopsies were taken you will be contacted by phone or by letter within the next 1-3 weeks.  Please call us  at [phone] if you have not heard about the biopsies in 3 weeks.    SIGNATURES/CONFIDENTIALITY: You and/or your care partner have signed paperwork which will be entered into your electronic medical record.  These signatures attest to the fact that that the information above on your After Visit Summary has been reviewed and is understood.  Full responsibility of the confidentiality of this discharge information lies with you and/or your care-partner.

## 2024-04-13 NOTE — Progress Notes (Signed)
 No significant changes to clinical history since GI office visit on 04/01/24.  The patient is appropriate for an endoscopic procedure in the ambulatory setting.  - Victory Brand, MD

## 2024-04-13 NOTE — Op Note (Addendum)
 [facility] Patient Name: Regina Cantu Procedure Date: 04/13/2024 11:32 AM MRN: [id number] Endoscopist: Victory L. Legrand , MD, [id number] Age: 43 Referring MD:  Date of Birth: October 29, 1980 Gender: Female Account #: [id number] Procedure:                Upper GI endoscopy Indications:              Epigastric abdominal pain, Esophageal reflux                            symptoms that persist despite appropriate therapy,                            Eructation Medicines:                Monitored Anesthesia Care Procedure:                Pre-Anesthesia Assessment:                           - Prior to the procedure, a History and Physical                            was performed, and patient medications and                            allergies were reviewed. The patient's tolerance of                            previous anesthesia was also reviewed. The risks                            and benefits of the procedure and the sedation                            options and risks were discussed with the patient.                            All questions were answered, and informed consent                            was obtained. Prior Anticoagulants: The patient has                            taken no anticoagulant or antiplatelet agents. ASA                            Grade Assessment: III - A patient with severe                            systemic disease. After reviewing the risks and                            benefits, the patient was deemed in satisfactory  condition to undergo the procedure.                           After obtaining informed consent, the endoscope was                            passed under direct vision. Throughout the                            procedure, the patient's blood pressure, pulse, and                            oxygen saturations were monitored continuously. The                            Olympus Scope [id number] was introduced  through the                            mouth, and advanced to the second part of duodenum.                            The upper GI endoscopy was accomplished without                            difficulty. The patient tolerated the procedure                            well. 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                            The polyp is non-obstructing the airway.                           Normal mucosa was found in the entire esophagus.                            Several biopsies were obtained in the middle third                            of the esophagus and in the lower third of the                            esophagus with cold forceps for evaluation of                            eosinophilic esophagitis.                           Normal mucosa was found in the entire examined                            stomach. Several  biopsies were obtained in the                            gastric body and in the gastric antrum with cold                            forceps for histology. (Rule out H. pylori)                           The cardia and gastric fundus were normal on                            retroflexion. (Hill Grade 1)                           Normal mucosa was found in the entire duodenum.                            Biopsies for histology were taken with a cold                            forceps for evaluation of celiac disease. Complications:            No immediate complications. Estimated Blood Loss:     Estimated blood loss was minimal. 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                           - Normal mucosa was found in the entire esophagus.                           - Normal mucosa was found in the entire stomach.                           - Normal mucosa was found in the entire examined                            duodenum. Biopsied.                           -  Several biopsies were obtained in the middle                            third of the esophagus and in the lower third of                            the esophagus.                           - Several biopsies were obtained in the gastric                            body and in the gastric antrum. Recommendation:           -  Patient has a contact number available for                            emergencies. The signs and symptoms of potential                            delayed complications were discussed with the                            patient. Return to normal activities tomorrow.                            Written discharge instructions were provided to the                            patient.                           - Resume previous diet.                           -Continue to work on antireflux diet and lifestyle                            measures.                           - Continue present medications.                           - Await pathology results.                           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 Jakelin Taussig L. Legrand, MD 04/13/2024 12:07:21 PM This report has been signed electronically.

## 2024-04-14 ENCOUNTER — Telehealth: Payer: Self-pay

## 2024-04-14 ENCOUNTER — Other Ambulatory Visit: Payer: Self-pay

## 2024-04-14 DIAGNOSIS — J381 Polyp of vocal cord and larynx: Secondary | ICD-10-CM

## 2024-04-14 NOTE — Telephone Encounter (Signed)
  Follow up Call-     04/13/2024   11:01 AM  Call back number  Post procedure Call Back phone  # [phone]  Permission to leave phone message Yes     Patient questions:  Do you have a fever, pain , or abdominal swelling? No. Pain Score  0 *  Have you tolerated food without any problems? Yes.    Have you been able to return to your normal activities? Yes.    Do you have any questions about your discharge instructions: Diet   No. Medications  No. Follow up visit  No.  Do you have questions or concerns about your Care? No.  Actions: * If pain score is 4 or above: No action needed, pain <4.

## 2024-04-16 ENCOUNTER — Encounter (INDEPENDENT_AMBULATORY_CARE_PROVIDER_SITE_OTHER): Payer: Self-pay

## 2024-04-16 DIAGNOSIS — F331 Major depressive disorder, recurrent, moderate: Secondary | ICD-10-CM | POA: Diagnosis not present

## 2024-04-16 DIAGNOSIS — F4310 Post-traumatic stress disorder, unspecified: Secondary | ICD-10-CM | POA: Diagnosis not present

## 2024-04-16 DIAGNOSIS — F411 Generalized anxiety disorder: Secondary | ICD-10-CM | POA: Diagnosis not present

## 2024-04-16 LAB — SURGICAL PATHOLOGY

## 2024-04-17 ENCOUNTER — Other Ambulatory Visit: Payer: Self-pay | Admitting: Medical Genetics

## 2024-04-20 ENCOUNTER — Ambulatory Visit: Payer: Self-pay | Admitting: Gastroenterology

## 2024-04-28 ENCOUNTER — Other Ambulatory Visit

## 2024-05-05 ENCOUNTER — Other Ambulatory Visit (HOSPITAL_COMMUNITY): Payer: Self-pay

## 2024-05-05 ENCOUNTER — Other Ambulatory Visit: Payer: Self-pay

## 2024-05-05 DIAGNOSIS — F4310 Post-traumatic stress disorder, unspecified: Secondary | ICD-10-CM | POA: Diagnosis not present

## 2024-05-05 DIAGNOSIS — F411 Generalized anxiety disorder: Secondary | ICD-10-CM | POA: Diagnosis not present

## 2024-05-05 DIAGNOSIS — F331 Major depressive disorder, recurrent, moderate: Secondary | ICD-10-CM | POA: Diagnosis not present

## 2024-05-05 MED ORDER — LAMOTRIGINE 25 MG PO TABS
ORAL_TABLET | ORAL | 0 refills | Status: DC
  Filled 2024-05-05: qty 60, 33d supply, fill #0

## 2024-05-05 MED ORDER — BUPROPION HCL ER (XL) 150 MG PO TB24
150.0000 mg | ORAL_TABLET | Freq: Every morning | ORAL | 1 refills | Status: DC
  Filled 2024-05-05 (×2): qty 30, 30d supply, fill #0
  Filled 2024-06-02: qty 30, 30d supply, fill #1

## 2024-05-05 MED ORDER — TRAZODONE HCL 100 MG PO TABS
50.0000 mg | ORAL_TABLET | Freq: Every evening | ORAL | 1 refills | Status: DC
  Filled 2024-05-05: qty 30, 30d supply, fill #0
  Filled 2024-06-02: qty 30, 30d supply, fill #1

## 2024-05-08 DIAGNOSIS — F331 Major depressive disorder, recurrent, moderate: Secondary | ICD-10-CM | POA: Diagnosis not present

## 2024-05-08 DIAGNOSIS — F411 Generalized anxiety disorder: Secondary | ICD-10-CM | POA: Diagnosis not present

## 2024-05-14 ENCOUNTER — Other Ambulatory Visit (HOSPITAL_COMMUNITY): Payer: Self-pay

## 2024-05-14 ENCOUNTER — Encounter: Payer: Self-pay | Admitting: Student

## 2024-05-14 ENCOUNTER — Telehealth (HOSPITAL_COMMUNITY): Payer: Self-pay

## 2024-05-14 ENCOUNTER — Encounter (HOSPITAL_COMMUNITY): Payer: Self-pay

## 2024-05-14 ENCOUNTER — Ambulatory Visit: Admitting: Student

## 2024-05-14 VITALS — BP 130/83 | HR 78 | Temp 98.2°F | Ht 62.0 in

## 2024-05-14 DIAGNOSIS — Z79899 Other long term (current) drug therapy: Secondary | ICD-10-CM

## 2024-05-14 DIAGNOSIS — G8929 Other chronic pain: Secondary | ICD-10-CM

## 2024-05-14 DIAGNOSIS — G43001 Migraine without aura, not intractable, with status migrainosus: Secondary | ICD-10-CM

## 2024-05-14 DIAGNOSIS — Z7985 Long-term (current) use of injectable non-insulin antidiabetic drugs: Secondary | ICD-10-CM | POA: Diagnosis not present

## 2024-05-14 DIAGNOSIS — G43719 Chronic migraine without aura, intractable, without status migrainosus: Secondary | ICD-10-CM

## 2024-05-14 DIAGNOSIS — Z23 Encounter for immunization: Secondary | ICD-10-CM | POA: Diagnosis not present

## 2024-05-14 DIAGNOSIS — E1100 Type 2 diabetes mellitus with hyperosmolarity without nonketotic hyperglycemic-hyperosmolar coma (NKHHC): Secondary | ICD-10-CM | POA: Diagnosis not present

## 2024-05-14 DIAGNOSIS — M25512 Pain in left shoulder: Secondary | ICD-10-CM | POA: Diagnosis not present

## 2024-05-14 LAB — POCT GLYCOSYLATED HEMOGLOBIN (HGB A1C): HbA1c, POC (controlled diabetic range): 6 % (ref 0.0–7.0)

## 2024-05-14 LAB — GLUCOSE, CAPILLARY: Glucose-Capillary: 135 mg/dL — ABNORMAL HIGH (ref 70–99)

## 2024-05-14 MED ORDER — NURTEC 75 MG PO TBDP
75.0000 mg | ORAL_TABLET | ORAL | 5 refills | Status: DC
  Filled 2024-05-14 – 2024-06-02 (×2): qty 8, 30d supply, fill #0

## 2024-05-14 NOTE — Telephone Encounter (Signed)
 Pharmacy Patient Advocate Encounter   Received notification from Pt Calls Messages that prior authorization for Nurtec 75MG  dispersible tablets  is required/requested.   Insurance verification completed.   The patient is insured through [employer].   Per test claim: PA required; PA submitted to above mentioned insurance via Latent Key/confirmation #/EOC [id number] Status is pending

## 2024-05-14 NOTE — Patient Instructions (Addendum)
 Regina Cantu,Thank you for allowing me to take part in your care today.  Here are your instructions.  1. Your A1c today is 6.0. You are well controlled. You did go up from 5.2. Please avoid sugary drinks and foods. Continue with the farxiga .   2. Please reach back out to your Orthopedic doctor. They will likely need to do other treatments.   3. You can take tylenol  for the pain and on the bad days you can take naproxen or ibuprofen  4. I have referred you back to neurology   5. You have received your pneumonia vaccine today    PLEASE BRING YOUR MEDICATIONS TO EVERY APPOINTMENT  Thank you, Dr. Tobie  If you have any other questions please contact the internal medicine clinic at [phone] If it is after hours, please call the [facility] at [phone] and then ask the person who picks up for the resident on call.

## 2024-05-14 NOTE — Progress Notes (Signed)
 CC: Chronic condition follow-up  HPI:  [name] is a 43 y.o. female with past medical history of hypertension, GERD, type 2 diabetes, hyperlipidemia who presents for follow-up appointment.  Please see assessment plan for full HPI.  Medications: Constipation: Linzess  290 mcg daily Hypertension: Amlodipine  5 mg daily, losartan -HCTZ 50-12.5 mg daily GERD: Pepcid  40 mg daily, Zofran  4 mg every 8 hours as needed, Protonix  40 mg daily Hyperlipidemia: Atorvastatin  80 mg daily Chronic migraines Robaxin  500 mg every 8 hours as needed, Nurtec 75 mg every other day, topiramate  75 mg nightly Diabetes: Farxiga  5 mg daily Depression: Wellbutrin  150 mg every morning, BuSpar  5 mg twice daily, Lamictal 50 mg daily Insomnia: Trazodone  100 mg nightly  Past Medical History:  Diagnosis Date   2023 2024   Anxiety    Cancer (HCC)    ovarian 2001 and 2010   Depression    Essential hypertension 05/04/2022   Herniated disc, cervical 2011   Hypertension    Migraine    Pityriasis rosea    Uncontrolled type 2 diabetes mellitus with hyperglycemia (HCC) 05/04/2022   URI, acute 08/17/2022     Current Outpatient Medications:    amLODipine  (NORVASC ) 5 MG tablet, Take 1 tablet (5 mg total) by mouth daily., Disp: 90 tablet, Rfl: 3   atorvastatin  (LIPITOR) 80 MG tablet, Take 1 tablet (80 mg total) by mouth daily., Disp: 90 tablet, Rfl: 3   buPROPion  (WELLBUTRIN  XL) 150 MG 24 hr tablet, Take 1 tablet (150 mg total) by mouth every morning., Disp: 30 tablet, Rfl: 1   busPIRone  (BUSPAR ) 5 MG tablet, Take 1 tablet (5 mg total) by mouth 2 (two) times daily., Disp: 60 tablet, Rfl: 0   EPINEPHrine  0.3 mg/0.3 mL IJ SOAJ injection, Inject 0.3 mg into the muscle as needed for anaphylaxis., Disp: 1 each, Rfl: 2   famotidine  (PEPCID ) 40 MG tablet, Take 1 tablet (40 mg total) by mouth at bedtime., Disp: 90 tablet, Rfl: 3   FARXIGA  5 MG TABS tablet, Take 1 tablet (5 mg total) by mouth daily before breakfast.,  Disp: 30 tablet, Rfl: 11   lamoTRIgine (LAMICTAL) 25 MG tablet, Take 1 tablet (25 mg total) by mouth every evening as directed, THEN increase to 2 tablets (50 mg total) daily., Disp: 60 tablet, Rfl: 0   lidocaine  (LIDODERM ) 5 %, Place 1 patch onto the skin daily. Remove & Discard patch within 12 hours or as directed by MD, Disp: 30 patch, Rfl: 0   linaclotide  (LINZESS ) 290 MCG CAPS capsule, Take 1 capsule (290 mcg total) by mouth daily before breakfast., Disp: 90 capsule, Rfl: 3   losartan -hydrochlorothiazide  (HYZAAR ) 50-12.5 MG tablet, Take 1 tablet by mouth daily., Disp: 90 tablet, Rfl: 3   methocarbamol  (ROBAXIN ) 500 MG tablet, Take 1 tablet (500 mg total) by mouth every 8 (eight) hours as needed (muscle spasms, pain)., Disp: 60 tablet, Rfl: 0   ondansetron  (ZOFRAN ) 4 MG tablet, Take 1 tablet (4 mg total) by mouth every 8 (eight) hours as needed for nausea or vomiting., Disp: 15 tablet, Rfl: 2   pantoprazole  (PROTONIX ) 40 MG tablet, Take 1 tablet (40 mg total) by mouth daily., Disp: 90 tablet, Rfl: 3   Rimegepant Sulfate  (NURTEC) 75 MG TBDP, Take 1 tablet (75 mg total) by mouth every other day., Disp: 30 tablet, Rfl: 5   topiramate  (TOPAMAX ) 25 MG tablet, Take 3 tablets (75 mg total) by mouth at bedtime., Disp: 270 tablet, Rfl: 3   traZODone  (DESYREL ) 100 MG tablet,  Take 1/2 tablet (50 mg) to 1 tablet (100 mg total) by mouth at bedtime as needed for sleep., Disp: 30 tablet, Rfl: 1   traZODone  (DESYREL ) 100 MG tablet, Take 0.5-1 tablets (50-100 mg total) by mouth at bedtime for sleep., Disp: 30 tablet, Rfl: 1   traZODone  (DESYREL ) 100 MG tablet, Take 0.5-1 tablets (50-100 mg total) by mouth at bedtime as needed for sleep, Disp: 30 tablet, Rfl: 1   traZODone  (DESYREL ) 100 MG tablet, Take 1/2-1 tablet (50-100 mg total) by mouth at bedtime for sleep., Disp: 30 tablet, Rfl: 1  Review of Systems:    MSK: Patient endorses left shoulder pain  Physical Exam:  Vitals:   05/14/24 1038  BP: 130/83   Pulse: 78  Temp: 98.2 F (36.8 C)  TempSrc: Oral  SpO2: 98%  Height: 5' 2 (1.575 m)   General: Patient is sitting comfortably in the room  Head: Normocephalic, atraumatic  Cardio: Regular rate and rhythm, no murmurs, rubs or gallops Pulmonary: Clear to ausculation bilaterally with no rales, rhonchi, and crackles  Extremities: Left upper extremity with limited range of motion in all fields with extension, flexion, abduction, internal rotation, external rotation of left shoulder.  4/5 grip strength to left hand.  Station intact.  Negative Spurling's test.  Positive empty can test on left shoulder.  All other extremities unremarkable.  Assessment & Plan:   Assessment & Plan Type 2 diabetes mellitus with hyperosmolarity without coma, without long-term current use of insulin (HCC) Patient past medical history of type 2 diabetes mellitus.  Well-controlled.  A1c did increase to 6.0 from 5.2.  Counseled her on the importance of active lifestyle and good nutrition.  Patient agreed to this.  She has had no more low episodes.  Obtain urine ACR today.  At previous visit Trulicity  was stopped secondary to hypoglycemia.  Plan: - Follow-up in 3 months - Obtain urine ACR today - Continue Farxiga  5 mg daily - If A1c continues to worsen, could start Trulicity  back  Migraine without aura and with status migrainosus, not intractable Patient endorses history of migraines.  Has daily migraines.  Medications at this time include Topamax  and Nurtec.  She however has not been able to obtain her Nurtec in the setting of prior authorization denial.  Likely needs a neurologist as patient has been lost to follow-up with neurology.  Plan: - Retry Nurtec 75 mg every other day - Refill Topamax  75 mg -Refer to neurology Chronic left shoulder pain Patient has a past medical history of left shoulder pain.  She has been followed for this since the beginning of the year.  She had an MRI which showed osteoarthritis  and tendinopathy.  She has been to physical therapy.  She is followed by orthopedics.  In April, after the treatment of physical therapy, she was doing well.  2 months ago her left shoulder pain came back.  She describes pain with all ranges of motion in her left shoulder.  She is unable to brush her teeth or brush her hair.  This is not bilateral, so I am not worried for autoimmune process.  On exam, patient has decreased range of motion of all fields on her shoulder joint.  Do wonder if patient had adhesive capsulitis.  She does have an empty can test does make she wonder if she has a supraspinatus injury.  Will refer patient back to orthopedics.  Will refer to physical therapy again.  Plan: - Refer to physical therapy - Refer back to  orthopedics, for potential intra-articular injections - Tylenol  4 times daily 1000 mg  - For breakthrough pain, recommended ibuprofen Vaccine for streptococcus pneumoniae and influenza Administered pneumonia vaccine today.   Patient discussed with Dr. Jeanelle Libby Blanch, DO Internal Medicine Resident PGY-3

## 2024-05-14 NOTE — Assessment & Plan Note (Signed)
 Patient past medical history of type 2 diabetes mellitus.  Well-controlled.  A1c did increase to 6.0 from 5.2.  Counseled her on the importance of active lifestyle and good nutrition.  Patient agreed to this.  She has had no more low episodes.  Obtain urine ACR today.  At previous visit Trulicity  was stopped secondary to hypoglycemia.  Plan: - Follow-up in 3 months - Obtain urine ACR today - Continue Farxiga  5 mg daily - If A1c continues to worsen, could start Trulicity  back

## 2024-05-14 NOTE — Telephone Encounter (Signed)
 PA request has been Received. New Encounter has been or will be created for follow up. For additional info see Pharmacy Prior Auth telephone encounter from 05/14/24.

## 2024-05-14 NOTE — Assessment & Plan Note (Signed)
 Patient endorses history of migraines.  Has daily migraines.  Medications at this time include Topamax  and Nurtec.  She however has not been able to obtain her Nurtec in the setting of prior authorization denial.  Likely needs a neurologist as patient has been lost to follow-up with neurology.  Plan: - Retry Nurtec 75 mg every other day - Refill Topamax  75 mg -Refer to neurology

## 2024-05-14 NOTE — Progress Notes (Signed)
 Internal Medicine Clinic Attending  Case discussed with the resident at the time of the visit.  We reviewed the resident's history and exam and pertinent patient test results.  I agree with the assessment, diagnosis, and plan of care documented in the resident's note.

## 2024-05-15 ENCOUNTER — Ambulatory Visit: Payer: Self-pay | Admitting: Student

## 2024-05-15 ENCOUNTER — Encounter: Payer: Self-pay | Admitting: Student

## 2024-05-15 LAB — MICROALBUMIN / CREATININE URINE RATIO
Creatinine, Urine: 155 mg/dL
Microalb/Creat Ratio: 7 mg/g{creat} (ref 0–29)
Microalbumin, Urine: 10.3 ug/mL

## 2024-05-15 NOTE — Telephone Encounter (Signed)
 Pharmacy Patient Advocate Encounter  Received notification from [facility] [employer] that Prior Authorization for Nurtec 75MG  dispersible tablets  has been DENIED.  Full denial letter will be uploaded to the media tab. See denial reason below.   PA #/Case ID/Reference #: [id number]

## 2024-05-22 DIAGNOSIS — F411 Generalized anxiety disorder: Secondary | ICD-10-CM | POA: Diagnosis not present

## 2024-05-22 DIAGNOSIS — F331 Major depressive disorder, recurrent, moderate: Secondary | ICD-10-CM | POA: Diagnosis not present

## 2024-05-27 ENCOUNTER — Other Ambulatory Visit: Payer: Self-pay

## 2024-05-27 ENCOUNTER — Ambulatory Visit: Attending: Family Medicine

## 2024-05-27 ENCOUNTER — Other Ambulatory Visit (HOSPITAL_COMMUNITY): Payer: Self-pay

## 2024-05-27 DIAGNOSIS — G8929 Other chronic pain: Secondary | ICD-10-CM | POA: Diagnosis not present

## 2024-05-27 DIAGNOSIS — E1165 Type 2 diabetes mellitus with hyperglycemia: Secondary | ICD-10-CM

## 2024-05-27 DIAGNOSIS — R293 Abnormal posture: Secondary | ICD-10-CM | POA: Diagnosis not present

## 2024-05-27 DIAGNOSIS — M25512 Pain in left shoulder: Secondary | ICD-10-CM | POA: Diagnosis not present

## 2024-05-27 DIAGNOSIS — M6281 Muscle weakness (generalized): Secondary | ICD-10-CM | POA: Insufficient documentation

## 2024-05-27 MED ORDER — FARXIGA 5 MG PO TABS
5.0000 mg | ORAL_TABLET | Freq: Every day | ORAL | 6 refills | Status: AC
  Filled 2024-05-27: qty 30, 30d supply, fill #0
  Filled 2024-06-29: qty 30, 30d supply, fill #1

## 2024-05-27 NOTE — Therapy (Signed)
 OUTPATIENT PHYSICAL THERAPY SHOULDER EVALUATION   Patient Name: Regina Cantu MRN: [id number] DOB:Apr 03, 1981, 43 y.o., female Today's Date: 05/27/2024  END OF SESSION:  PT End of Session - 05/27/24 1120     Visit Number 1    Number of Visits 4    Date for Recertification  07/27/24    Authorization Type MCD    PT Start Time 1045    PT Stop Time 1130    PT Time Calculation (min) 45 min    Activity Tolerance Patient limited by pain    Behavior During Therapy Restless          Past Medical History:  Diagnosis Date   2023 2024   Anxiety    Cancer (HCC)    ovarian 2001 and 2010   Depression    Essential hypertension 05/04/2022   Herniated disc, cervical 2011   Hypertension    Migraine    Pityriasis rosea    Uncontrolled type 2 diabetes mellitus with hyperglycemia (HCC) 05/04/2022   URI, acute 08/17/2022   Past Surgical History:  Procedure Laterality Date   ABDOMINAL HYSTERECTOMY     COLONOSCOPY WITH PROPOFOL  N/A 09/10/2023   Procedure: COLONOSCOPY WITH PROPOFOL ;  Surgeon: Jinny Carmine, MD;  Location: [facility];  Service: Endoscopy;  Laterality: N/A;   OVARIAN CANCER     Patient Active Problem List   Diagnosis Date Noted   Hyperlipidemia 11/11/2023   GERD (gastroesophageal reflux disease) 09/14/2023   Constipation 09/10/2023   LOC (loss of consciousness) (HCC) 08/09/2023   Incontinence of feces 08/09/2023   Back pain 03/18/2023   Abnormal Pap smear of cervix 12/27/2022   Tobacco use 08/17/2022   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   Healthcare maintenance 07/04/2022   Migraine 07/04/2022   History of CVA (cerebrovascular accident) 05/04/2022   History of ovarian cancer 05/04/2022   Type 2 diabetes mellitus (HCC) 05/04/2022   Essential hypertension 05/04/2022   Malignant (primary) neoplasm, unspecified (HCC) 05/24/2021   Other specified disorders of eustachian tube, bilateral 05/24/2021   Allergy, unspecified, initial encounter 05/24/2021     PCP: Bernadine Manos, MD   REFERRING PROVIDER: Jeanelle Layman CROME, MD  REFERRING DIAG: [phone] (ICD-10-CM) - Chronic left shoulder pain  THERAPY DIAG:  Chronic left shoulder pain  Muscle weakness (generalized)  Abnormal posture  Rationale for Evaluation and Treatment: Rehabilitation  ONSET DATE: chronic  SUBJECTIVE:                                                                                                                                                                                      SUBJECTIVE STATEMENT: Patient returns to OPPT following an episode of reaching  to side and noted excruciating pain Hand dominance: Right  PERTINENT HISTORY: Patient has a past medical history of left shoulder pain.  She has been followed for this since the beginning of the year.  She had an MRI which showed osteoarthritis and tendinopathy.  She has been to physical therapy.  She is followed by orthopedics.  In April, after the treatment of physical therapy, she was doing well.  2 months ago her left shoulder pain came back.  She describes pain with all ranges of motion in her left shoulder.  She is unable to brush her teeth or brush her hair.  This is not bilateral, so I am not worried for autoimmune process.  On exam, patient has decreased range of motion of all fields on her shoulder joint.  Do wonder if patient had adhesive capsulitis.  She does have an empty can test does make she wonder if she has a supraspinatus injury.  Will refer patient back to orthopedics.  Will refer to physical therapy again.   Plan: - Refer to physical therapy - Refer back to orthopedics, for potential intra-articular injections - Tylenol  4 times daily 1000 mg  - For breakthrough pain, recommended ibuprofen  PAIN:  Are you having pain? Yes: NPRS scale: 10/10 Pain location: L shoulder Pain description: sharp Aggravating factors: activity, reaching, ADLs  Relieving factors: sling and  ice  PRECAUTIONS: None  RED FLAGS: None   WEIGHT BEARING RESTRICTIONS: No  FALLS:  Has patient fallen in last 6 months? No  OCCUPATION: Not working  PLOF: Independent  PATIENT GOALS:To manage my shoulder pain  NEXT MD VISIT: TBD  OBJECTIVE:  Note: Objective measures were completed at Evaluation unless otherwise noted.  DIAGNOSTIC FINDINGS:  IMPRESSION: 1. Mild infraspinatus tendinopathy. 2. Mild glenohumeral osteoarthritis. Small geode or degenerative osteochondral lesion of the superior glenoid.     Electronically Signed   By: Ryan Salvage M.D.   On: 09/05/2023 16:43  PATIENT SURVEYS:  Quick Dash: 50/55 89% perceived disability  POSTURE: Elevated L shoulder  UPPER EXTREMITY ROM:   A/PROM Right eval Left eval  Shoulder flexion  60/60dP!  Shoulder extension  10dP!  Shoulder abduction  60/60dP!  Shoulder adduction    Shoulder internal rotation  /40P!  Shoulder external rotation  /0P!  Elbow flexion    Elbow extension    Wrist flexion    Wrist extension    Wrist ulnar deviation    Wrist radial deviation    Wrist pronation    Wrist supination    (Blank rows = not tested)  UPPER EXTREMITY MMT:  UTA due to pain  MMT Right eval Left eval  Shoulder flexion    Shoulder extension    Shoulder abduction    Shoulder adduction    Shoulder internal rotation    Shoulder external rotation    Middle trapezius    Lower trapezius    Elbow flexion    Elbow extension    Wrist flexion    Wrist extension    Wrist ulnar deviation    Wrist radial deviation    Wrist pronation    Wrist supination    Grip strength (lbs)    (Blank rows = not tested)  SHOULDER SPECIAL TESTS: Impingement tests: UTA due to pain Rotator cuff assessment: UTA due to pain Biceps assessment: UTA due to pain  JOINT MOBILITY TESTING:  Not tested  PALPATION:  Globally tender throughout shoulder girdle  TREATMENT:  [facility] Adult PT Treatment:                                                DATE: 05/27/24 Eval and HEP Self Care: Additional minutes spent for educating on updated Therapeutic Home Exercise Program as well as comparing current status to condition at start of symptoms. This included exercises focusing on stretching, strengthening, with focus on eccentric aspects. Long term goals include an improvement in range of motion, strength, endurance as well as avoiding reinjury. Patient's frequency would include in 1-2 times a day, 3-5 times a week for a duration of 6-12 weeks. Proper technique shown and discussed handout in great detail. All questions were discussed and addressed.      PATIENT EDUCATION: Education details: Discussed eval findings, rehab rationale and POC and patient is in agreement  Person educated: Patient Education method: Explanation and Handouts Education comprehension: verbalized understanding and needs further education  HOME EXERCISE PROGRAM: TBD  ASSESSMENT:  CLINICAL IMPRESSION: Patient is a 43 y.o. female who was seen today for physical therapy evaluation and treatment for L shoulder pain.   OBJECTIVE IMPAIRMENTS: decreased knowledge of condition, decreased mobility, decreased ROM, decreased strength, impaired UE functional use, postural dysfunction, obesity, and pain.   ACTIVITY LIMITATIONS: carrying, lifting, sleeping, bed mobility, dressing, and reach over head  PERSONAL FACTORS: Age, Past/current experiences, and Time since onset of injury/illness/exacerbation are also affecting patient's functional outcome.   REHAB POTENTIAL: Fair based on pain and no distinct relieving position  CLINICAL DECISION MAKING: Stable/uncomplicated  EVALUATION COMPLEXITY: Moderate   GOALS: Goals reviewed with patient? No  SHORT TERM GOALS: Target date: 06/24/2024    Patient to demonstrate independence in HEP   Baseline: TBD Goal status: INITIAL  2.  Patient will score at least 40/55 on qDSAH to signify clinically meaningful improvement in functional abilities.   Baseline: 50/55 Goal status: INITIAL     PLAN:  PT FREQUENCY: 1-2x/week  PT DURATION: 6 weeks  PLANNED INTERVENTIONS: 97110-Therapeutic exercises, 97530- Therapeutic activity, 97112- Neuromuscular re-education, 97535- Self Care, [id number]- Manual therapy, and Patient/Family education  PLAN FOR NEXT SESSION: HEP review and update, manual techniques as appropriate, aerobic tasks, ROM and flexibility activities, strengthening and PREs, TPDN, gait and balance training as needed   For all possible CPT codes, reference the Planned Interventions line above.     Check all conditions that are expected to impact treatment: {Conditions expected to impact treatment:None of these apply   If treatment provided at initial evaluation, no treatment charged due to lack of authorization.        Breylon Sherrow M Aniyia Rane, PT 05/27/2024, 11:21 AM

## 2024-05-28 ENCOUNTER — Institutional Professional Consult (permissible substitution) (INDEPENDENT_AMBULATORY_CARE_PROVIDER_SITE_OTHER): Admitting: Otolaryngology

## 2024-05-28 ENCOUNTER — Other Ambulatory Visit (HOSPITAL_COMMUNITY): Payer: Self-pay

## 2024-05-28 ENCOUNTER — Ambulatory Visit (INDEPENDENT_AMBULATORY_CARE_PROVIDER_SITE_OTHER)

## 2024-05-28 ENCOUNTER — Encounter (INDEPENDENT_AMBULATORY_CARE_PROVIDER_SITE_OTHER): Payer: Self-pay

## 2024-05-28 VITALS — BP 129/79 | HR 71 | Ht 62.0 in | Wt 193.0 lb

## 2024-05-28 DIAGNOSIS — J383 Other diseases of vocal cords: Secondary | ICD-10-CM

## 2024-05-28 DIAGNOSIS — J340 Abscess, furuncle and carbuncle of nose: Secondary | ICD-10-CM | POA: Diagnosis not present

## 2024-05-28 MED ORDER — MUPIROCIN 2 % EX OINT
1.0000 | TOPICAL_OINTMENT | Freq: Two times a day (BID) | CUTANEOUS | 0 refills | Status: AC
  Filled 2024-05-28: qty 22, 11d supply, fill #0

## 2024-05-28 NOTE — Progress Notes (Signed)
 Dear Dr. Legrand, Here is my assessment for our mutual patient, Regina Cantu. Thank you for allowing me the opportunity to care for your patient. Please do not hesitate to contact me should you have any other questions. Sincerely, Dr. Hadassah Parody  Otolaryngology Clinic Note Referring provider: Dr. Legrand HPI:   Initial HPI (05/28/2024) Discussed the use of AI scribe software for clinical note transcription with the patient, who gave verbal consent to proceed.  History of Present Illness Regina Cantu is a 43 year old female who presents with a vocal cord lesion discovered incidentally during an upper endoscopy.  Vocal cord lesion - Vocal cord lesion discovered incidentally during upper endoscopy approximately one month ago - No changes in voice - No dysphagia - no pain  Chronic nasal lesion - Persistent scab in left nostril for over twenty years and has worsened over time.  - Frequent manipulation and picking at the lesion - Occasional bleeding from the site   Independent Review of Additional Tests or Records:     PMH/Meds/All/SocHx/FamHx/ROS:   Past Medical History:  Diagnosis Date   2023 2024   Anxiety    Cancer (HCC)    ovarian 2001 and 2010   Depression    Essential hypertension 05/04/2022   Herniated disc, cervical 2011   Hypertension    Migraine    Pityriasis rosea    Uncontrolled type 2 diabetes mellitus with hyperglycemia (HCC) 05/04/2022   URI, acute 08/17/2022     Past Surgical History:  Procedure Laterality Date   ABDOMINAL HYSTERECTOMY     COLONOSCOPY WITH PROPOFOL  N/A 09/10/2023   Procedure: COLONOSCOPY WITH PROPOFOL ;  Surgeon: Jinny Carmine, MD;  Location: [facility] ENDOSCOPY;  Service: Endoscopy;  Laterality: N/A;   OVARIAN CANCER      Family History  Problem Relation Age of Onset   Hyperlipidemia Mother    Hypertension Mother    Diabetes Mother    Stroke Mother    Cerebral aneurysm Mother    Prostate cancer Father    Cerebral aneurysm  Father    Cancer Father    Stroke Father    Breast cancer Maternal Aunt 20   Aneurysm Cousin    Heart Problems Cousin    Heart Problems Cousin    Heart Problems Cousin      Social Connections: Moderately Integrated (09/13/2022)   Social Connection and Isolation Panel    Frequency of Communication with Friends and Family: Once a week    Frequency of Social Gatherings with Friends and Family: Once a week    Attends Religious Services: 1 to 4 times per year    Active Member of [employer] or Organizations: No    Attends Engineer, structural: 1 to 4 times per year    Marital Status: Living with partner     Current Outpatient Medications  Medication Instructions   amLODipine  (NORVASC ) 5 mg, Oral, Daily   atorvastatin  (LIPITOR) 80 mg, Oral, Daily   buPROPion  (WELLBUTRIN  XL) 150 mg, Oral, Every morning   busPIRone  (BUSPAR ) 5 mg, Oral, 2 times daily   EPINEPHrine  (EPI-PEN) 0.3 mg, Intramuscular, As needed   famotidine  (PEPCID ) 40 mg, Oral, Daily at bedtime   Farxiga  5 mg, Oral, Daily before breakfast   lamoTRIgine (LAMICTAL) 25 MG tablet Take 1 tablet (25 mg total) by mouth every evening as directed, THEN increase to 2 tablets (50 mg total) daily.   lidocaine  (LIDODERM ) 5 % 1 patch, Transdermal, Every 24 hours, Remove & Discard patch within 12 hours or  as directed by MD   Linzess  290 mcg, Oral, Daily before breakfast   losartan -hydrochlorothiazide  (HYZAAR ) 50-12.5 MG tablet 1 tablet, Oral, Daily   methocarbamol  (ROBAXIN ) 500 mg, Oral, Every 8 hours PRN   mupirocin ointment (BACTROBAN) 2 % 1 Application, Topical, 2 times daily   Nurtec 75 mg, Oral, Every other day   ondansetron  (ZOFRAN ) 4 mg, Oral, Every 8 hours PRN   pantoprazole  (PROTONIX ) 40 mg, Oral, Daily   topiramate  (TOPAMAX ) 75 mg, Oral, Daily at bedtime   traZODone  (DESYREL ) 100 MG tablet Take 1/2 tablet (50 mg) to 1 tablet (100 mg total) by mouth at bedtime as needed for sleep.   traZODone  (DESYREL ) 100 MG tablet Take 0.5-1  tablets (50-100 mg total) by mouth at bedtime for sleep.   traZODone  (DESYREL ) 100 MG tablet Take 0.5-1 tablets (50-100 mg total) by mouth at bedtime as needed for sleep   traZODone  (DESYREL ) 100 MG tablet Take 1/2-1 tablet (50-100 mg total) by mouth at bedtime for sleep.     Physical Exam:   BP 129/79   Pulse 71   Ht 5' 2 (1.575 m)   Wt 193 lb (87.5 kg)   SpO2 97%   BMI 35.30 kg/m   Salient findings:  CN II-XII intact No dysphonia   Bilateral EAC clear and TM intact with well pneumatized middle ear spaces Anterior rhinoscopy: Septum midline No lesions of oral cavity/oropharynx No obviously palpable neck masses/lymphadenopathy/thyromegaly No respiratory distress or stridor  Seprately Identifiable Procedures:  Prior to initiating any procedures, risks/benefits/alternatives were explained to the patient and verbal consent obtained.  Procedure Note (05/28/2024) Pre-procedure diagnosis:  concern for vocal cord lesion  Post-procedure diagnosis: Same Procedure: Transnasal Fiberoptic Laryngoscopy, CPT 31575 - Mod 25 Indication: vocal cord lesion Complications: None apparent EBL: 0 mL  The procedure was undertaken to further evaluate the patient's complaint of possible vocal cord lesion, with mirror exam inadequate for appropriate examination due to gag reflex and poor patient tolerance  Procedure:  Patient was identified as correct patient. Verbal consent was obtained. The nose was sprayed with oxymetazoline and 4% lidocaine . The The flexible laryngoscope was passed through the nose to view the nasal cavity, pharynx (oropharynx, hypopharynx) and larynx.  The larynx was examined at rest and during multiple phonatory tasks. Documentation was obtained and reviewed with patient. The scope was removed. The patient tolerated the procedure well.  Findings: The nasal cavity and nasopharynx did not reveal any masses or lesions, mucosa appeared to be without obvious lesions. The tongue base,  pharyngeal walls, piriform sinuses, vallecula, epiglottis and postcricoid region are normal in appearance EXCEPT: smooth, spherical lesion projecting off the right vocal process. The visualized portion of the subglottis and proximal trachea is widely patent. The vocal folds are mobile bilaterally. There are no lesions on the free edge of the vocal folds nor elsewhere in the larynx worrisome for malignancy.       PROCEDURE (05/28/2024): Bilateral Diagnostic Rigid Nasal Endoscopy Pre-procedure diagnosis: Concern for nasal cavity irritation Post-procedure diagnosis: nasal septal ulceration Indication: See pre-procedure diagnosis and physical exam above Complications: None apparent EBL: 0 mL Anesthesia: Lidocaine  4% and topical decongestant was topically sprayed in each nasal cavity  Description of Procedure:  Patient was identified. A rigid 30 degree endoscope was utilized to evaluate the sinonasal cavities, mucosa, sinus ostia and turbinates and septum.  Overall, signs of mucosal inflammation are noted.  Also noted are there is a scab and small ulcer on the left aspect of the nasal septum  likely due to repeated digital trauma. There is yellow scab/crusting overlying.  No mucopurulence, polyps, or masses noted.   Right Middle meatus: clear Right SE Recess: clear Left MM: clear Left SE Recess: clear Photodocumentation was obtained.  CPT CODE -- 68768 - Mod 25    Impression & Plans:  Regina Cantu is a 43 y.o. female with   1. Lesion of true vocal cord   2. 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 It does not appear like classic vocal process granuloma but the location supports that.  - Asked permission to discuss with laryngology colleague regarding the lesion to see if they'd recommend biopsy. I am  leaning toward this given that does not appear like classic granuloma.  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 No perforation at this time but discussed this could develop into one if she is not able to stop scratching the area.  - Prescribe antibiotic ointment for nasal application twice daily for one week to treat overlying crusting/ mild infection  - she will try to stop scratching the area    See below regarding exact medications prescribed this encounter including dosages and route: Meds ordered this encounter  Medications   mupirocin ointment (BACTROBAN) 2 %    Sig: Apply 1 Application topically 2 (two) times daily for 7 days.    Dispense:  22 g    Refill:  0      Thank you for allowing me the opportunity to care for your patient. Please do not hesitate to contact me should you have any other questions.  Sincerely, Hadassah Parody, MD Otolaryngologist (ENT), [facility] Phone: [phone] Fax: [phone]

## 2024-05-29 ENCOUNTER — Telehealth: Payer: Self-pay | Admitting: Family Medicine

## 2024-05-29 ENCOUNTER — Ambulatory Visit: Admitting: Family Medicine

## 2024-05-29 NOTE — Telephone Encounter (Signed)
 Called and LVM for pt to call back so that we can get more information from her on why she is needing to see our provider.

## 2024-06-02 ENCOUNTER — Other Ambulatory Visit: Payer: Self-pay

## 2024-06-02 ENCOUNTER — Other Ambulatory Visit (HOSPITAL_COMMUNITY): Payer: Self-pay

## 2024-06-02 MED ORDER — LAMOTRIGINE 25 MG PO TABS
50.0000 mg | ORAL_TABLET | Freq: Every day | ORAL | 0 refills | Status: DC
  Filled 2024-06-02: qty 60, 30d supply, fill #0

## 2024-06-03 ENCOUNTER — Ambulatory Visit (INDEPENDENT_AMBULATORY_CARE_PROVIDER_SITE_OTHER)

## 2024-06-03 DIAGNOSIS — J387 Other diseases of larynx: Secondary | ICD-10-CM | POA: Diagnosis not present

## 2024-06-04 ENCOUNTER — Other Ambulatory Visit (HOSPITAL_COMMUNITY): Payer: Self-pay

## 2024-06-04 ENCOUNTER — Other Ambulatory Visit: Payer: Self-pay

## 2024-06-04 DIAGNOSIS — F4310 Post-traumatic stress disorder, unspecified: Secondary | ICD-10-CM | POA: Diagnosis not present

## 2024-06-04 DIAGNOSIS — F411 Generalized anxiety disorder: Secondary | ICD-10-CM | POA: Diagnosis not present

## 2024-06-04 MED ORDER — BUSPIRONE HCL 5 MG PO TABS
5.0000 mg | ORAL_TABLET | Freq: Two times a day (BID) | ORAL | 2 refills | Status: AC
  Filled 2024-06-04: qty 60, 30d supply, fill #0
  Filled 2024-06-29: qty 60, 30d supply, fill #1

## 2024-06-04 MED ORDER — BUPROPION HCL ER (XL) 150 MG PO TB24
150.0000 mg | ORAL_TABLET | Freq: Every morning | ORAL | 2 refills | Status: AC
  Filled 2024-06-04 – 2024-07-16 (×2): qty 30, 30d supply, fill #0

## 2024-06-04 MED ORDER — LAMOTRIGINE 100 MG PO TABS
100.0000 mg | ORAL_TABLET | Freq: Every day | ORAL | 2 refills | Status: AC
  Filled 2024-06-04: qty 30, 30d supply, fill #0
  Filled 2024-06-29: qty 30, 30d supply, fill #1

## 2024-06-04 MED ORDER — TRAZODONE HCL 100 MG PO TABS
50.0000 mg | ORAL_TABLET | Freq: Every day | ORAL | 2 refills | Status: AC
Start: 2024-06-04 — End: ?
  Filled 2024-06-04 – 2024-06-29 (×2): qty 30, 30d supply, fill #0

## 2024-06-04 NOTE — Progress Notes (Addendum)
 Dear Dr. Bernadine, Here is my assessment for our mutual patient, Regina Cantu. Thank you for allowing me the opportunity to care for your patient. Please do not hesitate to contact me should you have any other questions. Sincerely, Dr. Hadassah Parody  Otolaryngology Clinic Note Referring provider: Dr. Bernadine HPI:   Initial HPI Discussed the use of AI scribe software for clinical note transcription with the patient, who gave verbal consent to proceed.  History of Present Illness Regina Cantu is a 43 year old female who presents with a vocal cord lesion discovered incidentally during an upper endoscopy.  Vocal cord lesion - Vocal cord lesion discovered incidentally during upper endoscopy approximately one month ago - No changes in voice - No dysphagia - no pain  Chronic nasal lesion - Persistent scab in left nostril for over twenty years and has worsened over time.  - Frequent manipulation and picking at the lesion - Occasional bleeding from the site  --------------------------------------------------------- 06/03/2024  Phone visit:  - Patient location: home  - Provider location: clinic Time spent on phone: 10 minutes    Phone visit today to discuss plan regarding laryngeal lesion. She has had no major change in sx.   Independent Review of Additional Tests or Records:     PMH/Meds/All/SocHx/FamHx/ROS:   Past Medical History:  Diagnosis Date   2023 2024   Anxiety    Cancer (HCC)    ovarian 2001 and 2010   Depression    Essential hypertension 05/04/2022   Herniated disc, cervical 2011   Hypertension    Migraine    Pityriasis rosea    Uncontrolled type 2 diabetes mellitus with hyperglycemia (HCC) 05/04/2022   URI, acute 08/17/2022     Past Surgical History:  Procedure Laterality Date   ABDOMINAL HYSTERECTOMY     COLONOSCOPY WITH PROPOFOL  N/A 09/10/2023   Procedure: COLONOSCOPY WITH PROPOFOL ;  Surgeon: Jinny Carmine, MD;  Location: [facility] ENDOSCOPY;   Service: Endoscopy;  Laterality: N/A;   OVARIAN CANCER      Family History  Problem Relation Age of Onset   Hyperlipidemia Mother    Hypertension Mother    Diabetes Mother    Stroke Mother    Cerebral aneurysm Mother    Prostate cancer Father    Cerebral aneurysm Father    Cancer Father    Stroke Father    Breast cancer Maternal Aunt 30   Aneurysm Cousin    Heart Problems Cousin    Heart Problems Cousin    Heart Problems Cousin      Social Connections: Moderately Integrated (09/13/2022)   Social Connection and Isolation Panel    Frequency of Communication with Friends and Family: Once a week    Frequency of Social Gatherings with Friends and Family: Once a week    Attends Religious Services: 1 to 4 times per year    Active Member of [employer] or Organizations: No    Attends Engineer, Structural: 1 to 4 times per year    Marital Status: Living with partner     Current Outpatient Medications  Medication Instructions   amLODipine  (NORVASC ) 5 mg, Oral, Daily   atorvastatin  (LIPITOR) 80 mg, Oral, Daily   buPROPion  (WELLBUTRIN  XL) 150 MG 24 hr tablet Take 1  tablet by mouth every morning   buPROPion  (WELLBUTRIN  XL) 150 mg, Oral, Every morning   busPIRone  (BUSPAR ) 5 MG tablet Take 1 tablet  by mouth twice a day   EPINEPHrine  (EPI-PEN) 0.3 mg, Intramuscular, As needed  famotidine  (PEPCID ) 40 mg, Oral, Daily at bedtime   Farxiga  5 mg, Oral, Daily before breakfast   lamoTRIgine (LAMICTAL) 100 MG tablet Take 1 tablet by mouth daily   lamoTRIgine (LAMICTAL) 25 MG tablet Take 1 tablet (25 mg total) by mouth every evening as directed, THEN increase to 2 tablets (50 mg total) daily.   lamoTRIgine (LAMICTAL) 50 mg, Oral, Daily   lidocaine  (LIDODERM ) 5 % 1 patch, Transdermal, Every 24 hours, Remove & Discard patch within 12 hours or as directed by MD   Linzess  290 mcg, Oral, Daily before breakfast   losartan -hydrochlorothiazide  (HYZAAR ) 50-12.5 MG tablet 1 tablet, Oral, Daily    methocarbamol  (ROBAXIN ) 500 mg, Oral, Every 8 hours PRN   mupirocin ointment (BACTROBAN) 2 % 1 Application, Topical, 2 times daily   Nurtec 75 mg, Oral, Every other day   ondansetron  (ZOFRAN ) 4 mg, Oral, Every 8 hours PRN   pantoprazole  (PROTONIX ) 40 mg, Oral, Daily   topiramate  (TOPAMAX ) 75 mg, Oral, Daily at bedtime   traZODone  (DESYREL ) 100 MG tablet Take 1/2 tablet (50 mg) to 1 tablet (100 mg total) by mouth at bedtime as needed for sleep.   traZODone  (DESYREL ) 100 MG tablet Take 0.5-1 tablets (50-100 mg total) by mouth at bedtime for sleep.   traZODone  (DESYREL ) 100 MG tablet Take 0.5-1 tablets (50-100 mg total) by mouth at bedtime as needed for sleep   traZODone  (DESYREL ) 100 MG tablet Take 1/2-1 tablet (50-100 mg total) by mouth at bedtime for sleep.   traZODone  (DESYREL ) 100 MG tablet Take 0.5-1 tablets (50-100 mg total) by mouth at bedtime for sleep.     Physical Exam:   There were no vitals taken for this visit.  None- phone visit   Seprately Identifiable Procedures:  Prior to initiating any procedures, risks/benefits/alternatives were explained to the patient and verbal consent obtained.  Procedure Note (05/28/24) Findings: The nasal cavity and nasopharynx did not reveal any masses or lesions, mucosa appeared to be without obvious lesions. The tongue base, pharyngeal walls, piriform sinuses, vallecula, epiglottis and postcricoid region are normal in appearance EXCEPT: smooth, spherical lesion projecting off the right vocal process. The visualized portion of the subglottis and proximal trachea is widely patent. The vocal folds are mobile bilaterally. There are no lesions on the free edge of the vocal folds nor elsewhere in the larynx worrisome for malignancy.         Impression & Plans:  Regina Cantu is a 43 y.o. female with   1. Lesion of larynx      Assessment and Plan Assessment & Plan Vocal cord lesion Vocal cord lesion on posterior vocal cords at the vocal  process Discussed that this might be a vocal process granuloma versus other cyst/mass. It does not appear like classic vocal process granuloma but the location supports that.  - Recommend removal and biopsy  Risks were dicussed including pain, bleeding, scar, change in voice (not expected given location), airway compromise (not expected given location), need for repeat procedures. She understands and would like to proceed.     I personally spent a total of 30 minutes in the care of the patient today including counseling and educating, placing orders, documenting clinical information in the EHR, and discussing risks benefits and alternatives to surgery.   See below regarding exact medications prescribed this encounter including dosages and route: No orders of the defined types were placed in this encounter.     Thank you for allowing me the opportunity to care for  your patient. Please do not hesitate to contact me should you have any other questions.  Sincerely, Hadassah Parody, MD Otolaryngologist (ENT), [facility] Phone: [phone] Fax: [phone]

## 2024-06-05 ENCOUNTER — Other Ambulatory Visit (HOSPITAL_COMMUNITY): Payer: Self-pay

## 2024-06-05 ENCOUNTER — Other Ambulatory Visit: Payer: Self-pay

## 2024-06-07 NOTE — Addendum Note (Signed)
 Addended by: GREGGORY IP on: 06/07/2024 08:08 PM   Modules accepted: Level of Service

## 2024-06-08 ENCOUNTER — Encounter: Payer: Self-pay | Admitting: Radiology

## 2024-06-08 ENCOUNTER — Ambulatory Visit: Admitting: Orthopedic Surgery

## 2024-06-08 ENCOUNTER — Encounter (HOSPITAL_COMMUNITY): Payer: Self-pay

## 2024-06-08 ENCOUNTER — Other Ambulatory Visit: Payer: Self-pay

## 2024-06-08 DIAGNOSIS — M25512 Pain in left shoulder: Secondary | ICD-10-CM | POA: Diagnosis not present

## 2024-06-08 NOTE — Progress Notes (Signed)
 SDW CALL  Patient was given pre-op instructions over the phone. The opportunity was given for the patient to ask questions. No further questions asked. Patient verbalized understanding of instructions given.   PCP - [facility] Cardiologist - denies Neurologist - scheduled to see in January for migraines  PPM/ICD - denies Device Orders -  n/a Rep Notified -  n/a  Chest x-ray - denies EKG - 08/09/23 Stress Test - denies ECHO - 05/05/22 Cardiac Cath - denies  Sleep Study - denies CPAP - n/a  Fasting Blood Sugar - patient has type II DM but does not check blood sugars at home   Last dose of GLP1 agonist-  n/a GLP1 instructions:  n/a  Last dose of Farxiga  was 11/3 - patient instructed not to take on the day of surger  Blood Thinner Instructions: n/a Aspirin  Instructions: n/a  ERAS Protcol - clears until 1015 PRE-SURGERY Ensure or G2-  n/a  COVID TEST-  n/a   Anesthesia review: yes - history of stroke  Patient denies shortness of breath, fever, cough and chest pain over the phone call   All instructions explained to the patient, with a verbal understanding of the material. Patient agrees to go over the instructions while at home for a better understanding.

## 2024-06-08 NOTE — Progress Notes (Signed)
 Orthopedic Surgery Progress Note     Assessment: Patient is a 43 y.o. female with left rotator cuff tendinopathy     Plan: -No operative plans -Patient has been using Tylenol  and NSAIDs.  She was going to therapy but then stopped as the pain persists.  Discussed injection as an additional nonoperative treatment to try.  After discussing this, patient elected to proceed.  This was done today in the office -She should resume physical therapy -Weight bearing status: as tolerated, range of motion as tolerated -Follow-up in office in 6 weeks, x-rays at next visit: None   Left shoulder injection note: After discussing the risk, benefits, alternatives of left shoulder injection, patient elected to proceed.  The patient was in the seated position with the left arm at rest on her side.  The lateral soft spot just distal to the acromion was prepped with an alcohol-based prep.  Ethyl chloride was used anesthetize the skin.  A 20-gauge needle was used to inject 1 cc of bupivacaine, 1 cc of lidocaine , 1 cc of Depo-Medrol understanding sterile technique.  Needle was withdrawn and Band-Aid was applied.  Patient tolerated procedure well.   ___________________________________________________________________________   Subjective: Patient has noticed worsening of her left shoulder pain.  She said she went to lift something about a month ago and pain got worse in the shoulder.  She has had difficulty with overhead activities and activities that require cross body motion such as putting on deodorant.  She feels the pain with use of the shoulder.  She does not have any pain at rest.     Physical Exam:   General: no acute distress, appears stated age Neurologic: alert, answering questions appropriately, following commands Respiratory: unlabored breathing on room air, symmetric chest rise Psychiatric: appropriate affect, normal cadence to speech   MSK:    -Left upper extremity             Pain with arc of  motion greater than 40 degrees, no pain through about 40 degrees, unable to Abduct arm past 70 degrees, pain and slight weakness with Jobe, no weakness with external rotation with arm at side, negative belly press             AIN/PIN/IO intact             Palpable radial pulse             Sensation intact to light touch in median/ulnar/radial/axillary nerve distributions             Hand warm and well perfused   Imaging: XRs of the left shoulder from 06/08/2024 were independently reviewed and interpreted, showing no fracture or dislocation.  There is no significant degenerative changes seen within the glenohumeral joint.  MRI of the left shoulder from 09/04/2023 was independently reviewed and interpreted, showing infraspinatus tendinopathy.  Increased signal near the footprint of the supraspinatus as well.  No rotator cuff tear seen.     Patient name: Regina Cantu Patient MRN: [id number] Date: 06/08/24

## 2024-06-09 ENCOUNTER — Ambulatory Visit (HOSPITAL_COMMUNITY): Admission: RE | Admit: 2024-06-09 | Discharge: 2024-06-09 | Disposition: A | Discharge status: Home / Self Care

## 2024-06-09 ENCOUNTER — Encounter (HOSPITAL_COMMUNITY): Admission: RE | Disposition: A | Discharge status: Home / Self Care | Payer: Self-pay | Source: Home / Self Care

## 2024-06-09 ENCOUNTER — Other Ambulatory Visit (HOSPITAL_COMMUNITY): Payer: Self-pay

## 2024-06-09 ENCOUNTER — Ambulatory Visit (HOSPITAL_COMMUNITY): Payer: Self-pay | Admitting: Physician Assistant

## 2024-06-09 ENCOUNTER — Encounter (HOSPITAL_COMMUNITY): Payer: Self-pay

## 2024-06-09 ENCOUNTER — Encounter (HOSPITAL_COMMUNITY): Payer: Self-pay | Admitting: Physician Assistant

## 2024-06-09 ENCOUNTER — Other Ambulatory Visit: Payer: Self-pay

## 2024-06-09 DIAGNOSIS — I10 Essential (primary) hypertension: Secondary | ICD-10-CM

## 2024-06-09 DIAGNOSIS — J381 Polyp of vocal cord and larynx: Secondary | ICD-10-CM | POA: Insufficient documentation

## 2024-06-09 DIAGNOSIS — E119 Type 2 diabetes mellitus without complications: Secondary | ICD-10-CM | POA: Insufficient documentation

## 2024-06-09 DIAGNOSIS — Z8673 Personal history of transient ischemic attack (TIA), and cerebral infarction without residual deficits: Secondary | ICD-10-CM | POA: Insufficient documentation

## 2024-06-09 DIAGNOSIS — J387 Other diseases of larynx: Secondary | ICD-10-CM | POA: Diagnosis not present

## 2024-06-09 DIAGNOSIS — J383 Other diseases of vocal cords: Secondary | ICD-10-CM | POA: Diagnosis not present

## 2024-06-09 DIAGNOSIS — F17210 Nicotine dependence, cigarettes, uncomplicated: Secondary | ICD-10-CM | POA: Diagnosis not present

## 2024-06-09 DIAGNOSIS — Z79899 Other long term (current) drug therapy: Secondary | ICD-10-CM | POA: Diagnosis not present

## 2024-06-09 HISTORY — PX: MICROLARYNGOSCOPY WITH LASER: SHX5972

## 2024-06-09 HISTORY — DX: Pneumonia, unspecified organism: J18.9

## 2024-06-09 LAB — BASIC METABOLIC PANEL WITH GFR
Anion gap: 12 (ref 5–15)
BUN: 23 mg/dL — ABNORMAL HIGH (ref 6–20)
CO2: 20 mmol/L — ABNORMAL LOW (ref 22–32)
Calcium: 9.5 mg/dL (ref 8.9–10.3)
Chloride: 105 mmol/L (ref 98–111)
Creatinine, Ser: 0.65 mg/dL (ref 0.44–1.00)
GFR, Estimated: >60 mL/min (ref >60)
Glucose, Bld: 125 mg/dL — ABNORMAL HIGH (ref 70–99)
Potassium: 4.2 mmol/L (ref 3.5–5.1)
Sodium: 137 mmol/L (ref 135–145)

## 2024-06-09 LAB — GLUCOSE, CAPILLARY
Glucose-Capillary: 117 mg/dL — ABNORMAL HIGH (ref 70–99)
Glucose-Capillary: 136 mg/dL — ABNORMAL HIGH (ref 70–99)
Glucose-Capillary: 108 mg/dL — ABNORMAL HIGH (ref 70–99)

## 2024-06-09 LAB — CBC
HCT: 46.3 % — ABNORMAL HIGH (ref 36.0–46.0)
Hemoglobin: 15.5 g/dL — ABNORMAL HIGH (ref 12.0–15.0)
MCH: 29.4 pg (ref 26.0–34.0)
MCHC: 33.5 g/dL (ref 30.0–36.0)
MCV: 87.9 fL (ref 80.0–100.0)
Platelets: 331 K/uL (ref 150–400)
RBC: 5.27 MIL/uL — ABNORMAL HIGH (ref 3.87–5.11)
RDW: 11.8 % (ref 11.5–15.5)
WBC: 9.4 K/uL (ref 4.0–10.5)
nRBC: 0 % (ref 0.0–0.2)

## 2024-06-09 SURGERY — MICROLARYNGOSCOPY, WITH PROCEDURE USING LASER
Anesthesia: General

## 2024-06-09 MED ORDER — ORAL CARE MOUTH RINSE: 15.0000 mL | Freq: Once | OROMUCOSAL | Status: AC

## 2024-06-09 MED ORDER — DEXAMETHASONE SOD PHOSPHATE PF 10 MG/ML IJ SOLN
INTRAMUSCULAR | Status: DC | PRN
  Administered 2024-06-09: 10 mg via INTRAVENOUS

## 2024-06-09 MED ORDER — EPINEPHRINE HCL (NASAL) 0.1 % NA SOLN
NASAL | Status: AC
  Filled 2024-06-09: qty 30

## 2024-06-09 MED ORDER — OXYCODONE HCL 5 MG/5ML PO SOLN: 5.0000 mg | Freq: Once | ORAL | Status: DC | PRN

## 2024-06-09 MED ORDER — OXYMETAZOLINE HCL 0.05 % NA SOLN
NASAL | Status: AC
  Filled 2024-06-09: qty 30

## 2024-06-09 MED ORDER — OXYMETAZOLINE HCL 0.05 % NA SOLN
NASAL | Status: DC | PRN
  Administered 2024-06-09: 1 via TOPICAL

## 2024-06-09 MED ORDER — LIDOCAINE 2% (20 MG/ML) 5 ML SYRINGE
INTRAMUSCULAR | Status: DC | PRN
  Administered 2024-06-09: 40 mg via INTRAVENOUS

## 2024-06-09 MED ORDER — ACETAMINOPHEN 500 MG PO TABS
1000.0000 mg | ORAL_TABLET | Freq: Once | ORAL | Status: AC
  Administered 2024-06-09: 1000 mg via ORAL
  Filled 2024-06-09: qty 2

## 2024-06-09 MED ORDER — OXYCODONE HCL 5 MG PO TABS: 5.0000 mg | ORAL_TABLET | Freq: Once | ORAL | Status: DC | PRN

## 2024-06-09 MED ORDER — FENTANYL CITRATE (PF) 250 MCG/5ML IJ SOLN
INTRAMUSCULAR | Status: DC | PRN
  Administered 2024-06-09 (×2): 50 ug via INTRAVENOUS

## 2024-06-09 MED ORDER — LACTATED RINGERS IV SOLN: INTRAVENOUS | Status: DC

## 2024-06-09 MED ORDER — FENTANYL CITRATE (PF) 100 MCG/2ML IJ SOLN: 25.0000 ug | INTRAMUSCULAR | Status: DC | PRN

## 2024-06-09 MED ORDER — INSULIN ASPART 100 UNIT/ML IJ SOLN: 0.0000 [IU] | INTRAMUSCULAR | Status: DC | PRN

## 2024-06-09 MED ORDER — PROPOFOL 10 MG/ML IV BOLUS
INTRAVENOUS | Status: AC
  Filled 2024-06-09: qty 20

## 2024-06-09 MED ORDER — HYDROCODONE-ACETAMINOPHEN 5-325 MG PO TABS
1.0000 | ORAL_TABLET | Freq: Four times a day (QID) | ORAL | 0 refills | Status: AC | PRN
  Filled 2024-06-09: qty 10, 3d supply, fill #0

## 2024-06-09 MED ORDER — TRIAMCINOLONE ACETONIDE 40 MG/ML IJ SUSP
INTRAMUSCULAR | Status: AC
  Filled 2024-06-09: qty 5

## 2024-06-09 MED ORDER — SUGAMMADEX SODIUM 200 MG/2ML IV SOLN
INTRAVENOUS | Status: DC | PRN
  Administered 2024-06-09: 200 mg via INTRAVENOUS

## 2024-06-09 MED ORDER — AMISULPRIDE (ANTIEMETIC) 5 MG/2ML IV SOLN: 10.0000 mg | Freq: Once | INTRAVENOUS | Status: DC | PRN

## 2024-06-09 MED ORDER — FENTANYL CITRATE (PF) 100 MCG/2ML IJ SOLN
INTRAMUSCULAR | Status: AC
  Filled 2024-06-09: qty 2

## 2024-06-09 MED ORDER — PROPOFOL 10 MG/ML IV BOLUS
INTRAVENOUS | Status: DC | PRN
  Administered 2024-06-09: 150 mg via INTRAVENOUS

## 2024-06-09 MED ORDER — ROCURONIUM BROMIDE 10 MG/ML (PF) SYRINGE
PREFILLED_SYRINGE | INTRAVENOUS | Status: DC | PRN
Start: 2024-06-09 — End: 2024-06-09
  Administered 2024-06-09: 50 mg via INTRAVENOUS

## 2024-06-09 MED ORDER — MIDAZOLAM HCL 2 MG/2ML IJ SOLN
INTRAMUSCULAR | Status: AC
  Filled 2024-06-09: qty 2

## 2024-06-09 MED ORDER — MIDAZOLAM HCL (PF) 2 MG/2ML IJ SOLN
INTRAMUSCULAR | Status: DC | PRN
  Administered 2024-06-09: 2 mg via INTRAVENOUS

## 2024-06-09 MED ORDER — ONDANSETRON HCL 4 MG/2ML IJ SOLN
INTRAMUSCULAR | Status: DC | PRN
Start: 2024-06-09 — End: 2024-06-09
  Administered 2024-06-09: 4 mg via INTRAVENOUS

## 2024-06-09 MED ORDER — TRIAMCINOLONE ACETONIDE 40 MG/ML IJ SUSP
INTRAMUSCULAR | Status: DC | PRN
  Administered 2024-06-09: 40 mg via INTRAMUSCULAR

## 2024-06-09 MED ORDER — CHLORHEXIDINE GLUCONATE 0.12 % MT SOLN
15.0000 mL | Freq: Once | OROMUCOSAL | Status: AC
Start: 2024-06-09 — End: 2024-06-09
  Administered 2024-06-09: 15 mL via OROMUCOSAL
  Filled 2024-06-09: qty 15

## 2024-06-09 SURGICAL SUPPLY — 31 items
BAG COUNTER SPONGE SURGICOUNT (BAG) ×1 IMPLANT
BALLN PULMONARY 10-12 (MISCELLANEOUS) IMPLANT
BALLOON PULM 12 13.5 15X75 (BALLOONS) IMPLANT
BLADE SURG 15 STRL LF DISP TIS (BLADE) IMPLANT
BNDG EYE OVAL 2 1/8 X 2 5/8 (GAUZE/BANDAGES/DRESSINGS) ×2 IMPLANT
CANISTER SUCTION 3000ML PPV (SUCTIONS) ×1 IMPLANT
CNTNR URN SCR LID CUP LEK RST (MISCELLANEOUS) IMPLANT
COAGULATOR SUCT 8FR VV (MISCELLANEOUS) IMPLANT
COAGULATOR SUCT SWTCH 10FR 6 (ELECTROSURGICAL) IMPLANT
COVER BACK TABLE 60X90IN (DRAPES) ×1 IMPLANT
COVER MAYO STAND STRL (DRAPES) ×1 IMPLANT
DRAPE HALF SHEET 40X57 (DRAPES) ×1 IMPLANT
GAUZE SPONGE 4X4 12PLY STRL (GAUZE/BANDAGES/DRESSINGS) ×1 IMPLANT
GLOVE BIO SURGEON STRL SZ 6 (GLOVE) ×1 IMPLANT
GOWN STRL REUS W/ TWL LRG LVL3 (GOWN DISPOSABLE) IMPLANT
KIT BASIN OR (CUSTOM PROCEDURE TRAY) ×1 IMPLANT
KIT TURNOVER KIT B (KITS) ×1 IMPLANT
NDL HYPO 25GX1X1/2 BEV (NEEDLE) IMPLANT
NEEDLE HYPO 25GX1X1/2 BEV (NEEDLE) IMPLANT
PAD ARMBOARD POSITIONER FOAM (MISCELLANEOUS) ×2 IMPLANT
PATTIES SURGICAL .5X1.5 (GAUZE/BANDAGES/DRESSINGS) ×1 IMPLANT
POSITIONER HEAD DONUT 9IN (MISCELLANEOUS) IMPLANT
SET COLLECT BLD 25X3/4 12 (NEEDLE) ×1 IMPLANT
SOLN 0.9% NACL POUR BTL 1000ML (IV SOLUTION) ×1 IMPLANT
SOLN STERILE WATER BTL 1000 ML (IV SOLUTION) ×1 IMPLANT
SOLUTION ANTFG W/FOAM PAD STRL (MISCELLANEOUS) ×1 IMPLANT
SURGILUBE 2OZ TUBE FLIPTOP (MISCELLANEOUS) IMPLANT
SUT SILK 2 0 PERMA HAND 18 BK (SUTURE) IMPLANT
SYR TB 1ML LUER SLIP (SYRINGE) ×1 IMPLANT
TOWEL GREEN STERILE FF (TOWEL DISPOSABLE) ×1 IMPLANT
TUBE CONNECTING 12X1/4 (SUCTIONS) ×1 IMPLANT

## 2024-06-09 NOTE — Anesthesia Preprocedure Evaluation (Addendum)
 Anesthesia Evaluation  Patient identified by MRN, date of birth, ID band Patient awake    Reviewed: Allergy & Precautions, NPO status , Patient's Chart, lab work & pertinent test results  Airway Mallampati: II  TM Distance: >3 FB Neck ROM: Full    Dental  (+) Teeth Intact, Dental Advisory Given   Pulmonary pneumonia, resolved, Current Smoker and Patient abstained from smoking.   breath sounds clear to auscultation       Cardiovascular hypertension (amlodipine , losartan -HCTZ), Pt. on medications  Rhythm:Regular Rate:Normal  HLD  TTE 05/05/2022: IMPRESSIONS    1. Left ventricular ejection fraction, by estimation, is 70 to 75%. Left  ventricular ejection fraction by PLAX is 71 %. The left ventricle has  hyperdynamic function. The left ventricle has no regional wall motion  abnormalities. There is mild left  ventricular hypertrophy. Left ventricular diastolic parameters are  consistent with Grade I diastolic dysfunction (impaired relaxation).   2. Right ventricular systolic function is normal. The right ventricular  size is normal.   3. The mitral valve is grossly normal. Trivial mitral valve  regurgitation.   4. The aortic valve is tricuspid. Aortic valve regurgitation is not  visualized.   5. Agitated saline contrast bubble study was negative, with no evidence  of any interatrial shunt.     Neuro/Psych  Headaches PSYCHIATRIC DISORDERS Anxiety Depression    CVA    GI/Hepatic ,GERD  Medicated,,  Endo/Other  diabetes, Type 2    Renal/GU      Musculoskeletal   Abdominal   Peds  Hematology Lab Results      Component                Value               Date                      WBC                      8.1                 04/01/2024                HGB                      14.6                04/01/2024                HCT                      42.5                04/01/2024                MCV                      87.2                 04/01/2024                PLT                      375.0               04/01/2024              Anesthesia Other Findings Lesion  of larynx  Last Farxiga :  Reproductive/Obstetrics H/o ovarian cancer                              Anesthesia Physical Anesthesia Plan  ASA: 3  Anesthesia Plan: General   Post-op Pain Management: Tylenol  PO (pre-op)*   Induction: Intravenous  PONV Risk Score and Plan: 2 and Ondansetron , Dexamethasone and Treatment may vary due to age or medical condition  Airway Management Planned: Oral ETT  Additional Equipment:   Intra-op Plan:   Post-operative Plan: Extubation in OR  Informed Consent: I have reviewed the patients History and Physical, chart, labs and discussed the procedure including the risks, benefits and alternatives for the proposed anesthesia with the patient or authorized representative who has indicated his/her understanding and acceptance.     Dental advisory given  Plan Discussed with: CRNA  Anesthesia Plan Comments:          Anesthesia Quick Evaluation

## 2024-06-09 NOTE — Op Note (Signed)
 Otolaryngology Operative note  Regina Cantu Date/Time of Admission: 06/09/2024 10:21 AM  CSN: [id number];MRN:[id number]  DOB: 05-02-1981 Age: 43 y.o. Location: [facility] OR    Pre-Op Diagnosis: Right vocal fold lesion  Post-Op Diagnosis: Right vocal fold lesion  Procedure: Suspension micro direct laryngoscopy with tracheoscopy - 68473 Microlaryngoscopy with excision of vocal fold lesion using CO2 laser and operating microscope- 31540 Kenalog injection with operating telescope - [phone]  Surgeon: Hadassah Parody, MD  Anesthesia type:  General  Anesthesiologist: Anesthesiologist: Tilford Franky BIRCH, MD CRNA: Alen Bernardino BIRCH, CRNA   Staff: Circulator: Prentiss Shona NOVAK, RN Laser Staff: Mort Warren RAMAN, RN Scrub Person: Bridgett Waddell PARAS, RN  Implants: none  Specimens: ID Type Source Tests Collected by Time Destination  1 : right vocal fold lesion Tissue PATH ENT biopsy SURGICAL PATHOLOGY Travanti Mcmanus, Hadassah BROCKS, MD 06/09/2024 1218     EBL: minimal   Drains: none  Post-op disposition and condition: PACU, hemodynamically stable   Findings: - Straight forward exposure with both Dedo and Sataloff laryngoscopes  - Right posterior vocal fold lesion, well circumscribed and spherical. Slightly firm, No significant bleeding or friability of tissue.  - No additional lesions of larynx, subglottis or trachea     Complications: None apparent  Indications and consent:  Regina Cantu is a 43 y.o. female with diagnoses above. The patient's options were discussed, including risks/benefits/alternatives for each option. Patient expressed understanding, and despite these risks, consented and decided to proceed with above procedures. Informed consent was signed before proceeding.  Procedure: The patient was brought to the operating room. Intubation was performed by the anesthesia team using a laser safe endotracheal tube.   The head of the bed was then turned 90 degrees. A pre-procedure time-out  was performed. The patient's eyes were taped followed by saline soaked Raytec sponges on the eyes, followed by 3 silk tape to hold in place. A tooth guard was inserted to protect the maxillary dentition.   Exposure was first achieved using a Dedo laryngoscope and photo documentation was obtained using a 0-degree telescope. The Dedo was removed and a laser safe Sataloff larynoscope was used to expose the larynx. Once appropriate exposure was obtained the laryngoscope was placed into suspension. The 0-degree telescope was used to examine the subglottis and trachea and no additional lesions were identified.   The operating microscope was then brought into the field and the laser was attached. Saline soaked pledgets were placed subglottically to protect the endotracheal tube balloon. Wet towels were placed around the patients face to protect skin and lips. The laser was then turned on at 4 watts continuous mode. The lesion was grasped using triangular microlaryngeal forceps and pulled medially. The CO2 laser was use to make incision around its contour with attempt to safe as much healthy vocal fold as possible. The incision was carried out around the periphery of the lesion. Once there was only a thin attachment left, the microlaryngeal scissors were used to excise the remainder of the lesion. The lesion was passed off the field to be sent to pathology.  The laser was placed on standby.   The saline soaked pledgets were removed and afrin soaked pledgets were used to assist with hemostasis.   Photo documentation was obtained using the 0-degree operating telescope.   Kenalog (0.5cc) was injected into the wound bed using butterfly needle. The patient was hemostatic.    The patient was then taken out of suspension and the laryngoscope removed, suctioning the pharynx on the way out.  Care of the patient was then transferred to anesthesia. The patient was awakened extubated and transferred to PACU in stable  condition.

## 2024-06-09 NOTE — Transfer of Care (Signed)
 Immediate Anesthesia Transfer of Care Note  Patient: Regina Cantu  Procedure(s) Performed: MICROLARYNGOSCOPY, WITH PROCEDURE USING LASER  Patient Location: PACU  Anesthesia Type:General  Level of Consciousness: drowsy  Airway & Oxygen Therapy: Patient Spontanous Breathing and Patient connected to face mask oxygen  Post-op Assessment: Report given to RN and Post -op Vital signs reviewed and stable  Post vital signs: Reviewed and stable  Last Vitals:  Vitals Value Taken Time  BP 168/98 06/09/24 14:27  Temp    Pulse 80 06/09/24 14:30  Resp 20 06/09/24 14:30  SpO2 97 % 06/09/24 14:30  Vitals shown include unfiled device data.  Last Pain:  Vitals:   06/09/24 1135  PainSc: 4       Patients Stated Pain Goal: 2 (06/09/24 1126)  Complications: There were no known notable events for this encounter.

## 2024-06-09 NOTE — Anesthesia Procedure Notes (Signed)
 Procedure Name: Intubation Date/Time: 06/09/2024 1:31 PM  Performed by: Alen Motto D, CRNAPre-anesthesia Checklist: Patient identified, Emergency Drugs available, Suction available and Patient being monitored Patient Re-evaluated:Patient Re-evaluated prior to induction Oxygen Delivery Method: Circle System Utilized Preoxygenation: Pre-oxygenation with 100% oxygen Induction Type: IV induction Ventilation: Mask ventilation without difficulty Laryngoscope Size: Mac and 3 Grade View: Grade I Tube type: Oral (Laser Tub) Laser Tube: Cuffed inflated with minimal occlusive pressure - saline Tube size: 5.5 mm Number of attempts: 1 Airway Equipment and Method: Stylet and Oral airway Placement Confirmation: ETT inserted through vocal cords under direct vision, positive ETCO2 and breath sounds checked- equal and bilateral Secured at: 21 cm Tube secured with: Tape Dental Injury: Teeth and Oropharynx as per pre-operative assessment

## 2024-06-09 NOTE — Discharge Instructions (Signed)
   Surgery Discharge Instructions:  Call clinic or return to ED if you: - become short of breath - have uncontrollable nausea or vomiting - any other acute events, problems, or concerns  Instructions:  - Voice rest for 3 days  Medications: - Resume your regular home medications except as detailed in the medication reconciliation.  - For pain, take tylenol  650 mg every 6 hours alternating with Ibuprofen 600mg  every 6 hours. If that is not sufficient, a stronger pain medication has been prescribed to you. Do not mix with any other narcotic medication.  Follow Up:  - A follow up appointment should be scheduled for you after discharge, and our secretaries will be contacting you with the details of that appointment. However, If you do not hear about your appoinment in 3 business days, please call the provided surgery clinic number and confirm/schedule your appointment.     Activity/Restrictions:  - Resume your regular activities, as tolerated. Avoid heavy lifting or straining (more than 15 lbs) for 2 weeks .  Diet: - Resume your regular diet, as tolerated  Additional Instructions: - Please take an over the counter stool softener while taking narcotic pain medication - DO NOT MIX NARCOTIC PAIN MEDICATIONS OR TAKE NARCOTIC PRESCRIPTIONS AT THE SAME TIME - DO NOT DRIVE OR OPERATE HEAVY MACHINERY WHILE ON NARCOTICS  - DO NOT TAKE MORE THAN 4 GRAMS (4000mg ) OF TYLENOL  (ACETAMINOPHEN ) IN 24 HOURS

## 2024-06-09 NOTE — H&P (Signed)
 Pre-Operative H&P - Day Of Surgery Patient Name: Regina Cantu Date:   06/09/2024  HPI: Regina Cantu is a 43 y.o. female who presents today for operative treatment of laryngea lesion. Patient denies recent significant changes to health or significant new medications or physiologic change in condition which would immediately impact plans. No new types of therapy has been initiated that would change the plan or the appropriateness of the plan.   ROS:  A complete review of systems was obtained and is otherwise negative.  PMH:  Past Medical History:  Diagnosis Date   2023 2024   Anxiety    Cancer (HCC)    ovarian 2001 and 2010   Depression    Essential hypertension 05/04/2022   Herniated disc, cervical 2011   Hypertension    Migraine    Pityriasis rosea    Pneumonia    Uncontrolled type 2 diabetes mellitus with hyperglycemia (HCC) 05/04/2022   URI, acute 08/17/2022    PSH:  Past Surgical History:  Procedure Laterality Date   ABDOMINAL HYSTERECTOMY     CESAREAN SECTION     COLONOSCOPY WITH PROPOFOL  N/A 09/10/2023   Procedure: COLONOSCOPY WITH PROPOFOL ;  Surgeon: Jinny Carmine, MD;  Location: [facility] ENDOSCOPY;  Service: Endoscopy;  Laterality: N/A;   OVARIAN CANCER     TUBAL LIGATION Right    UPPER GI ENDOSCOPY      MEDS:   Current Facility-Administered Medications:    insulin aspart (novoLOG) injection 0-14 Units, 0-14 Units, Subcutaneous, Q2H PRN, Peggye Delon Brunswick, MD   lactated ringers  infusion, , Intravenous, Continuous, Peggye Delon Brunswick, MD, Last Rate: 10 mL/hr at 06/09/24 1136, New Bag at 06/09/24 1136  ALLERGIES: Bee venom, Mushroom extract complex (obsolete), Penicillins, Wasp venom, and Latex  EXAM: Vitals: BP (!) 140/68   Pulse 65   Temp 98.2 F (36.8 C)   Resp 20   Ht 5' 2 (1.575 m)   Wt 86.2 kg   SpO2 99%   BMI 34.75 kg/m   General Awake, at baseline alertness.   HEENT No scleral icterus or conjunctival hemorrhage. Globe position  appears normal.   Cardiovascular No cyanosis.  Pulmonary No audible stridor. Breathing easily with no labor.  Neuro Symmetric facial movement.   Psychiatry Appropriate affect and mood.  Skin No scars or lesions on face or neck.  Extermities Moves all extremities with normal range of motion.   Other Findings None.   Assessment & Plan: Casi has diagnoses of laryngeal lesion and will go to the OR today for SMDL removal of lesion with CO2 laser, steroid injection. Informed consent was obtained and available in EMR today. All questions have been answered, and risks/benefits/alternatives of procedure as noted in the consent were discussed in a quiet area. Questions were invited and answered. The patient expressed understanding, provided consent and wished to proceed despite risks.  Risks were dicussed including pain, bleeding, scar, change in voice (not expected given location), airway compromise (not expected given location), fire, need for repeat procedures. She understands and would like to proceed.     Regina Cantu Regina Cantu 06/09/2024 12:53 PM

## 2024-06-10 ENCOUNTER — Encounter (HOSPITAL_COMMUNITY): Payer: Self-pay

## 2024-06-10 LAB — SURGICAL PATHOLOGY

## 2024-06-10 NOTE — Anesthesia Postprocedure Evaluation (Signed)
 Anesthesia Post Note  Patient: Regina Cantu  Procedure(s) Performed: MICROLARYNGOSCOPY, WITH PROCEDURE USING LASER     Patient location during evaluation: PACU Anesthesia Type: General Level of consciousness: awake and alert Pain management: pain level controlled Vital Signs Assessment: post-procedure vital signs reviewed and stable Respiratory status: spontaneous breathing, nonlabored ventilation, respiratory function stable and patient connected to nasal cannula oxygen Cardiovascular status: blood pressure returned to baseline and stable Postop Assessment: no apparent nausea or vomiting Anesthetic complications: no   There were no known notable events for this encounter.  Last Vitals:  Vitals:   06/09/24 1515 06/09/24 1520  BP: (!) 159/98   Pulse: 84 87  Resp: 11 (!) 22  Temp:  36.6 C  SpO2: 96% 96%    Last Pain:  Vitals:   06/09/24 1520  PainSc: 0-No pain                 [name]

## 2024-06-11 NOTE — Progress Notes (Signed)
 Regina Console, PA-C [address] [place], [place]  [address] Phone: [phone]   Primary Care Physician: Bernadine Manos, MD  Primary Gastroenterologist:  Regina Console, PA-C / Victory Brand III, MD   Chief Complaint: Follow-up GERD, epigastric pain, and constipation.       HPI:   Discussed the use of AI scribe software for clinical note transcription with the patient, who gave verbal consent to proceed.  43 year old female returns for 47-month follow-up of GERD, epigastric pain, and chronic constipation.  Currently taking Linzess  290 mcg once daily, pantoprazole  40 mg every morning, and famotidine  40 mg every afternoon.  GI symptoms have greatly improved on these treatments.  04/2024 EGD by Dr. 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  Normal esophagus, stomach, and duodenum.  Biopsies negative for celiac, H. pylori, and EOE.  Esophageal biopsies positive for reflux esophagitis.  She recently saw ENT for follow-up of vocal cord polyp.  She underwent laryngoscopy with polypectomy 06/09/2024.  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  History of Present Illness She notes improvement in symptoms, stating 'I still burp, but I don't taste the bile on my throat,' and denies any acid coming up. She experienced nausea when taking Vicodin, which she discontinued.  She is also taking Linzess  290 mcg once daily at night for constipation, as it is more convenient for her to manage at home. She finds that overhydrating after meals helps with bowel movements, but prefers to do this at night to avoid issues during the day.  She has a history of elevated liver function tests and fatty liver disease. Her liver tests in August showed significant improvement from the previous year.  09/2023 colonoscopy: No polyps.  10-year repeat.  02/2023 RUQ ultrasound: 1. Increased hepatic parenchymal echogenicity suggestive of  steatosis. 2. No cholelithiasis or sonographic evidence for acute cholecystitis.  History of elevated LFTs attributed to hepatic steatosis, MASLD.  Fib 4 score low risk for fibrosis.  Liver labs in 01/2023 showed mildly elevated alk phos 131, ALT 42. Normal AST 19, total bilirubin 0.3. LFTs greatly improved. Normal iron, ceruloplasmin, immunoglobulins, alpha 1 antitrypsin. Negative ANA, AMA, ASMA, and celiac. Negative HIV, hepatitis C, and hepatitis B. Not immune to hepatitis B. Positive hepatitis A total antibody.   Current Outpatient Medications  Medication Sig Dispense Refill   amLODipine  (NORVASC ) 5 MG tablet Take 1 tablet (5 mg total) by mouth daily. 90 tablet 3   atorvastatin  (LIPITOR) 80 MG tablet Take 1 tablet (80 mg total) by mouth daily. 90 tablet 3   buPROPion  (WELLBUTRIN  XL) 150 MG 24 hr tablet Take 1  tablet by mouth every morning 30 tablet 2   busPIRone  (BUSPAR ) 5 MG tablet Take 1 tablet  by mouth twice a day (Patient taking differently: Take 5 mg by mouth daily as needed (anxiety).) 60 tablet 2   EPINEPHrine  0.3 mg/0.3 mL IJ SOAJ injection Inject 0.3 mg into the muscle as needed for anaphylaxis. 1 each 2   FARXIGA  5 MG TABS tablet Take 1 tablet (5 mg total) by mouth daily before breakfast. 30 tablet 6   HYDROcodone -acetaminophen  (NORCO/VICODIN) 5-325 MG tablet Take 1 tablet by mouth every 6 (six) hours as needed for severe pain (pain score 7-10). 10 tablet 0   lamoTRIgine (LAMICTAL) 100 MG tablet Take 1 tablet by mouth daily 30 tablet 2   lidocaine  (LIDODERM ) 5 % Place 1 patch onto the skin daily. Remove &  Discard patch within 12 hours or as directed by MD (Patient taking differently: Place 1 patch onto the skin See admin instructions. Every 12 hours or as needed) 30 patch 0   losartan -hydrochlorothiazide  (HYZAAR ) 50-12.5 MG tablet Take 1 tablet by mouth daily. 90 tablet 3   methocarbamol  (ROBAXIN ) 500 MG tablet Take 1 tablet (500 mg total) by mouth every 8 (eight) hours as needed  (muscle spasms, pain). (Patient taking differently: Take 500 mg by mouth every 8 (eight) hours.) 60 tablet 0   ondansetron  (ZOFRAN ) 4 MG tablet Take 1 tablet (4 mg total) by mouth every 8 (eight) hours as needed for nausea or vomiting. 15 tablet 2   Rimegepant Sulfate  (NURTEC) 75 MG TBDP Take 1 tablet (75 mg total) by mouth every other day. (Patient taking differently: Take 75 mg by mouth daily as needed.) 30 tablet 5   topiramate  (TOPAMAX ) 25 MG tablet Take 3 tablets (75 mg total) by mouth at bedtime. 270 tablet 3   traZODone  (DESYREL ) 100 MG tablet Take 0.5-1 tablets (50-100 mg total) by mouth at bedtime for sleep. (Patient taking differently: Take 100 mg by mouth at bedtime.) 30 tablet 2   famotidine  (PEPCID ) 40 MG tablet Take 1 tablet (40 mg total) by mouth at bedtime. 90 tablet 3   linaclotide  (LINZESS ) 290 MCG CAPS capsule Take 1 capsule (290 mcg total) by mouth daily before breakfast. 90 capsule 3   pantoprazole  (PROTONIX ) 40 MG tablet Take 1 tablet (40 mg total) by mouth daily. 90 tablet 3   No current facility-administered medications for this visit.    Allergies as of 06/12/2024 - Review Complete 06/12/2024  Allergen Reaction Noted   Bee venom Anaphylaxis 06/01/2022   Mushroom extract complex (obsolete) Anaphylaxis 05/04/2022   Penicillins Hives, Nausea And Vomiting, and Rash 03/04/2013   Wasp venom Anaphylaxis 06/01/2022   Latex Rash 06/01/2022    Past Medical History:  Diagnosis Date   2023 2024   Anxiety    Cancer (HCC)    ovarian 2001 and 2010   Depression    Essential hypertension 05/04/2022   Herniated disc, cervical 2011   Hypertension    Migraine    Pityriasis rosea    Pneumonia    Uncontrolled type 2 diabetes mellitus with hyperglycemia (HCC) 05/04/2022   URI, acute 08/17/2022    Past Surgical History:  Procedure Laterality Date   ABDOMINAL HYSTERECTOMY     CESAREAN SECTION     COLONOSCOPY WITH PROPOFOL  N/A 09/10/2023   Procedure: COLONOSCOPY WITH  PROPOFOL ;  Surgeon: Jinny Carmine, MD;  Location: [facility] ENDOSCOPY;  Service: Endoscopy;  Laterality: N/A;   MICROLARYNGOSCOPY WITH LASER N/A 06/09/2024   Procedure: MICROLARYNGOSCOPY, WITH PROCEDURE USING LASER;  Surgeon: Greggory Hadassah BROCKS, MD;  Location: [facility] OR;  Service: ENT;  Laterality: N/A;  Microlaryngoscopy with CO2 laser excision of vocal fold lesion and steroid injection   OVARIAN CANCER     TUBAL LIGATION Right    UPPER GI ENDOSCOPY      Review of Systems:    All systems reviewed and negative except where noted in HPI.    Physical Exam:  BP (!) 80/50 (BP Location: Left Arm, Patient Position: Sitting, Cuff Size: Large)   Pulse 72   Ht 5' 1.5 (1.562 m) Comment: height measured without shoes  Wt 208 lb 6 oz (94.5 kg)   BMI 38.73 kg/m  No LMP recorded. Patient has had a hysterectomy.  General: Well-nourished, well-developed in no acute distress.  Neuro: Alert and oriented x  3.  Grossly intact.  Psych: Alert and cooperative, normal mood and affect.   Imaging Studies: XR Shoulder Left Result Date: 06/08/2024 XRs of the left shoulder from 06/08/2024 were independently reviewed and interpreted, showing no fracture or dislocation.  There is no significant degenerative changes seen within the glenohumeral joint.   Labs: CBC    Component Value Date/Time   WBC 9.4 06/09/2024 1104   RBC 5.27 (H) 06/09/2024 1104   HGB 15.5 (H) 06/09/2024 1104   HCT 46.3 (H) 06/09/2024 1104   PLT 331 06/09/2024 1104   MCV 87.9 06/09/2024 1104   MCH 29.4 06/09/2024 1104   MCHC 33.5 06/09/2024 1104   RDW 11.8 06/09/2024 1104   LYMPHSABS 3.3 04/01/2024 1445   MONOABS 0.6 04/01/2024 1445   EOSABS 0.0 04/01/2024 1445   BASOSABS 0.0 04/01/2024 1445    CMP     Component Value Date/Time   NA 137 06/09/2024 1104   NA 136 03/18/2023 1050   K 4.2 06/09/2024 1104   CL 105 06/09/2024 1104   CO2 20 (L) 06/09/2024 1104   GLUCOSE 125 (H) 06/09/2024 1104   BUN 23 (H) 06/09/2024 1104   BUN 12  03/18/2023 1050   CREATININE 0.65 06/09/2024 1104   CALCIUM  9.5 06/09/2024 1104   PROT 7.5 04/01/2024 1445   PROT 6.6 03/19/2023 0834   ALBUMIN 4.5 04/01/2024 1445   ALBUMIN 4.2 03/19/2023 0834   AST 17 04/01/2024 1445   ALT 44 (H) 04/01/2024 1445   ALKPHOS 102 04/01/2024 1445   BILITOT 0.4 04/01/2024 1445   BILITOT 0.3 03/19/2023 0834   GFRNONAA >60 06/09/2024 1104   GFRAA >60 09/25/2018 0644     Assessment and Plan:   Regina Cantu is a 43 y.o. y/o female returns for follow-up of: Assessment & Plan 1.  Gastroesophageal reflux disease (GERD): Improved Improved with current medication. Negative H. pylori and eosinophilic esophagitis. - Continue pantoprazole  40 mg every morning. - Continue famotidine  40 mg at bedtime. - Ensured medication refills for one year. - Recommend Lifestyle Modifications to prevent Acid Reflux.  Rec. Avoid coffee, sodas, peppermint, garlic, onions, alcohol, citrus fruits, chocolate, tomatoes, fatty and spicey foods.  Avoid eating 2-3 hours before bedtime.    2.  Chronic constipation: Improved Managed with Linzess  290 mcg daily. Prefers nighttime dosing for convenience. Hydration increases bowel movements. - Continue Linzess  290 mcg daily at night. - Ensured medication refills for one year.  3.  Hepatic steatosis (fatty liver disease) Fatty liver confirmed. Liver function improved since last year. No new symptoms. - Continue low-fat diet, exercise, and weight loss. -  Fibrosis 4 Score = .35 (Low risk)      Interpretation for patients with NAFLD          <1.30       -  F0-F1 (Low risk)          1.30-2.67 -  Indeterminate           >2.67      -  F3-F4 (High risk)     Validated for ages 56-65  4.  Benign vocal cord polyp - Continue follow-up with the ENT.    Regina Console, PA-C  Follow up in 1 year.

## 2024-06-12 ENCOUNTER — Encounter: Payer: Self-pay | Admitting: Physician Assistant

## 2024-06-12 ENCOUNTER — Ambulatory Visit: Admitting: Physician Assistant

## 2024-06-12 ENCOUNTER — Other Ambulatory Visit (HOSPITAL_COMMUNITY): Payer: Self-pay

## 2024-06-12 VITALS — BP 80/50 | HR 72 | Ht 61.5 in | Wt 208.4 lb

## 2024-06-12 DIAGNOSIS — K581 Irritable bowel syndrome with constipation: Secondary | ICD-10-CM | POA: Diagnosis not present

## 2024-06-12 DIAGNOSIS — K219 Gastro-esophageal reflux disease without esophagitis: Secondary | ICD-10-CM | POA: Diagnosis not present

## 2024-06-12 DIAGNOSIS — K5904 Chronic idiopathic constipation: Secondary | ICD-10-CM

## 2024-06-12 DIAGNOSIS — J381 Polyp of vocal cord and larynx: Secondary | ICD-10-CM | POA: Diagnosis not present

## 2024-06-12 MED ORDER — FAMOTIDINE 40 MG PO TABS
40.0000 mg | ORAL_TABLET | Freq: Every day | ORAL | 3 refills | Status: AC
  Filled 2024-06-12: qty 90, 90d supply, fill #0

## 2024-06-12 MED ORDER — LINACLOTIDE 290 MCG PO CAPS
290.0000 ug | ORAL_CAPSULE | Freq: Every day | ORAL | 3 refills | Status: AC
  Filled 2024-06-12: qty 90, 90d supply, fill #0

## 2024-06-12 MED ORDER — PANTOPRAZOLE SODIUM 40 MG PO TBEC
40.0000 mg | DELAYED_RELEASE_TABLET | Freq: Every day | ORAL | 3 refills | Status: AC
  Filled 2024-06-12: qty 90, 90d supply, fill #0

## 2024-06-12 NOTE — Patient Instructions (Addendum)
 We have sent the following medications to your pharmacy for you to pick up at your convenience: Pantoprazole  40 mg daily, Linzess  290 mcg daily and Famotidine  40 mg at once at bedtime  Please follow up sooner if symptoms increase or worsen  Due to recent changes in healthcare laws, you may see the results of your imaging and laboratory studies on MyChart before your provider has had a chance to review them.  We understand that in some cases there may be results that are confusing or concerning to you. Not all laboratory results come back in the same time frame and the provider may be waiting for multiple results in order to interpret others.  Please give us  48 hours in order for your provider to thoroughly review all the results before contacting the office for clarification of your results.   Thank you for trusting me with your gastrointestinal care!   Ellouise Console, PA-C _______________________________________________________  If your blood pressure at your visit was 140/90 or greater, please contact your primary care physician to follow up on this.  _______________________________________________________  If you are age 12 or older, your body mass index should be between 23-30. Your Body mass index is 38.73 kg/m. If this is out of the aforementioned range listed, please consider follow up with your Primary Care Provider.  If you are age 53 or younger, your body mass index should be between 19-25. Your Body mass index is 38.73 kg/m. If this is out of the aformentioned range listed, please consider follow up with your Primary Care Provider.   ________________________________________________________  The [facility] providers would like to encourage you to use MYCHART to communicate with providers for non-urgent requests or questions.  Due to long hold times on the telephone, sending your provider a message by [facility] may be a faster and more efficient way to get a response.  Please allow 48  business hours for a response.  Please remember that this is for non-urgent requests.  _______________________________________________________

## 2024-06-13 NOTE — Progress Notes (Signed)
 ____________________________________________________________  Attending physician addendum:  Thank you for sending this case to me. I have reviewed the entire note and agree with the plan.  Glad to hear she is feeling better and that the vocal cord polyp was benign.  Victory Brand, MD  ____________________________________________________________

## 2024-06-17 ENCOUNTER — Ambulatory Visit: Attending: Family Medicine

## 2024-06-17 DIAGNOSIS — M25512 Pain in left shoulder: Secondary | ICD-10-CM | POA: Insufficient documentation

## 2024-06-17 DIAGNOSIS — G8929 Other chronic pain: Secondary | ICD-10-CM | POA: Diagnosis not present

## 2024-06-17 DIAGNOSIS — M6281 Muscle weakness (generalized): Secondary | ICD-10-CM | POA: Diagnosis not present

## 2024-06-17 DIAGNOSIS — R293 Abnormal posture: Secondary | ICD-10-CM | POA: Diagnosis not present

## 2024-06-17 NOTE — Therapy (Signed)
 OUTPATIENT PHYSICAL THERAPY SHOULDER EVALUATION   Patient Name: Regina Cantu MRN: [id number] DOB:04-08-1981, 43 y.o., female Today's Date: 06/17/2024  END OF SESSION:    Past Medical History:  Diagnosis Date   2023 2024   Anxiety    Cancer (HCC)    ovarian 2001 and 2010   Depression    Essential hypertension 05/04/2022   Herniated disc, cervical 2011   Hypertension    Migraine    Pityriasis rosea    Pneumonia    Uncontrolled type 2 diabetes mellitus with hyperglycemia (HCC) 05/04/2022   URI, acute 08/17/2022   Past Surgical History:  Procedure Laterality Date   ABDOMINAL HYSTERECTOMY     CESAREAN SECTION     COLONOSCOPY WITH PROPOFOL  N/A 09/10/2023   Procedure: COLONOSCOPY WITH PROPOFOL ;  Surgeon: Jinny Carmine, MD;  Location: [facility] ENDOSCOPY;  Service: Endoscopy;  Laterality: N/A;   MICROLARYNGOSCOPY WITH LASER N/A 06/09/2024   Procedure: MICROLARYNGOSCOPY, WITH PROCEDURE USING LASER;  Surgeon: Greggory Hadassah BROCKS, MD;  Location: [facility] OR;  Service: ENT;  Laterality: N/A;  Microlaryngoscopy with CO2 laser excision of vocal fold lesion and steroid injection   OVARIAN CANCER     TUBAL LIGATION Right    UPPER GI ENDOSCOPY     Patient Active Problem List   Diagnosis Date Noted   Lesion of vocal fold 06/09/2024   Hyperlipidemia 11/11/2023   GERD (gastroesophageal reflux disease) 09/14/2023   Constipation 09/10/2023   LOC (loss of consciousness) (HCC) 08/09/2023   Incontinence of feces 08/09/2023   Back pain 03/18/2023   Abnormal Pap smear of cervix 12/27/2022   Tobacco use 08/17/2022   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   Healthcare maintenance 07/04/2022   Migraine 07/04/2022   History of CVA (cerebrovascular accident) 05/04/2022   History of ovarian cancer 05/04/2022   Type 2 diabetes mellitus (HCC) 05/04/2022   Essential hypertension 05/04/2022   Malignant (primary) neoplasm, unspecified (HCC) 05/24/2021   Other specified disorders of eustachian tube,  bilateral 05/24/2021   Allergy, unspecified, initial encounter 05/24/2021    PCP: Bernadine Manos, MD   REFERRING PROVIDER: Jeanelle Layman CROME, MD  REFERRING DIAG: [phone] (ICD-10-CM) - Chronic left shoulder pain  THERAPY DIAG:  Chronic left shoulder pain  Muscle weakness (generalized)  Rationale for Evaluation and Treatment: Rehabilitation  ONSET DATE: chronic  SUBJECTIVE:                                                                                                                                                                                      SUBJECTIVE STATEMENT: Patient reports to PT 1 week after recent corticosteroid injection. She states that she is having 5/10 pain today.  She notes that she sometimes has to support her shoulder with pillows while laying on her back because it won't relax back for awhile.   Hand dominance: Right  PERTINENT HISTORY: Patient has a past medical history of left shoulder pain.  She has been followed for this since the beginning of the year.  She had an MRI which showed osteoarthritis and tendinopathy.  She has been to physical therapy.  She is followed by orthopedics.  In April, after the treatment of physical therapy, she was doing well.  2 months ago her left shoulder pain came back.  She describes pain with all ranges of motion in her left shoulder.  She is unable to brush her teeth or brush her hair.  This is not bilateral, so I am not worried for autoimmune process.  On exam, patient has decreased range of motion of all fields on her shoulder joint.  Do wonder if patient had adhesive capsulitis.  She does have an empty can test does make she wonder if she has a supraspinatus injury.  Will refer patient back to orthopedics.  Will refer to physical therapy again.   Plan: - Refer to physical therapy - Refer back to orthopedics, for potential intra-articular injections - Tylenol  4 times daily 1000 mg  - For breakthrough pain,  recommended ibuprofen  PAIN:  Are you having pain? Yes: NPRS scale: 10/10 Pain location: L shoulder Pain description: sharp Aggravating factors: activity, reaching, ADLs  Relieving factors: sling and ice  PRECAUTIONS: None  RED FLAGS: None   WEIGHT BEARING RESTRICTIONS: No  FALLS:  Has patient fallen in last 6 months? No  OCCUPATION: Not working  PLOF: Independent  PATIENT GOALS:To manage my shoulder pain  NEXT MD VISIT: TBD  OBJECTIVE:  Note: Objective measures were completed at Evaluation unless otherwise noted.  DIAGNOSTIC FINDINGS:  IMPRESSION: 1. Mild infraspinatus tendinopathy. 2. Mild glenohumeral osteoarthritis. Small geode or degenerative osteochondral lesion of the superior glenoid.     Electronically Signed   By: Ryan Salvage M.D.   On: 09/05/2023 16:43  PATIENT SURVEYS:  Quick Dash: 50/55 89% perceived disability  POSTURE: Elevated L shoulder  UPPER EXTREMITY ROM:   A/PROM Right eval Left eval  Shoulder flexion  60/60dP!  Shoulder extension  10dP!  Shoulder abduction  60/60dP!  Shoulder adduction    Shoulder internal rotation  /40P!  Shoulder external rotation  /0P!  Elbow flexion    Elbow extension    Wrist flexion    Wrist extension    Wrist ulnar deviation    Wrist radial deviation    Wrist pronation    Wrist supination    (Blank rows = not tested)  UPPER EXTREMITY MMT:  UTA due to pain  MMT Right eval Left eval  Shoulder flexion    Shoulder extension    Shoulder abduction    Shoulder adduction    Shoulder internal rotation    Shoulder external rotation    Middle trapezius    Lower trapezius    Elbow flexion    Elbow extension    Wrist flexion    Wrist extension    Wrist ulnar deviation    Wrist radial deviation    Wrist pronation    Wrist supination    Grip strength (lbs)    (Blank rows = not tested)  SHOULDER SPECIAL TESTS: Impingement tests: UTA due to pain Rotator cuff assessment: UTA due to  pain Biceps assessment: UTA due to pain  JOINT MOBILITY TESTING:  Not  tested  PALPATION:  Globally tender throughout shoulder girdle                                                                                                                             TREATMENT:    [facility] Adult PT Treatment:                                                DATE: 06/17/2024  Therapeutic Exercise: UBE  Prone pendulum  Prone scapular retraction Seated Shrug  Manual Therapy: *** Neuromuscular re-ed: *** Therapeutic Activity: *** Modalities: *** Self Care: [facility] Adult PT Treatment:                                                DATE: 05/27/24 Eval and HEP Self Care: Additional minutes spent for educating on updated Therapeutic Home Exercise Program as well as comparing current status to condition at start of symptoms. This included exercises focusing on stretching, strengthening, with focus on eccentric aspects. Long term goals include an improvement in range of motion, strength, endurance as well as avoiding reinjury. Patient's frequency would include in 1-2 times a day, 3-5 times a week for a duration of 6-12 weeks. Proper technique shown and discussed handout in great detail. All questions were discussed and addressed.      PATIENT EDUCATION: Education details: Discussed eval findings, rehab rationale and POC and patient is in agreement  Person educated: Patient Education method: Explanation and Handouts Education comprehension: verbalized understanding and needs further education  HOME EXERCISE PROGRAM: TBD  ASSESSMENT:  CLINICAL IMPRESSION: Patient is a 43 y.o. female who was seen today for physical therapy evaluation and treatment for L shoulder pain.   OBJECTIVE IMPAIRMENTS: decreased knowledge of condition, decreased mobility, decreased ROM, decreased strength, impaired UE functional use, postural dysfunction, obesity, and pain.   ACTIVITY LIMITATIONS: carrying,  lifting, sleeping, bed mobility, dressing, and reach over head  PERSONAL FACTORS: Age, Past/current experiences, and Time since onset of injury/illness/exacerbation are also affecting patient's functional outcome.   REHAB POTENTIAL: Fair based on pain and no distinct relieving position  CLINICAL DECISION MAKING: Stable/uncomplicated  EVALUATION COMPLEXITY: Moderate   GOALS: Goals reviewed with patient? No  SHORT TERM GOALS: Target date: 06/24/2024    Patient to demonstrate independence in HEP  Baseline: TBD Goal status: INITIAL  2.  Patient will score at least 40/55 on qDSAH to signify clinically meaningful improvement in functional abilities.   Baseline: 50/55 Goal status: INITIAL     PLAN:  PT FREQUENCY: 1-2x/week  PT DURATION: 6 weeks  PLANNED INTERVENTIONS: 97110-Therapeutic exercises, 97530- Therapeutic activity, [id number]- Neuromuscular re-education, 97535- Self Care, [id number]- Manual therapy, and Patient/Family  education  PLAN FOR NEXT SESSION: HEP review and update, manual techniques as appropriate, aerobic tasks, ROM and flexibility activities, strengthening and PREs, TPDN, gait and balance training as needed   For all possible CPT codes, reference the Planned Interventions line above.     Check all conditions that are expected to impact treatment: {Conditions expected to impact treatment:None of these apply   If treatment provided at initial evaluation, no treatment charged due to lack of authorization.        Marko Molt, PT 06/17/2024, 12:19 PM

## 2024-06-22 ENCOUNTER — Ambulatory Visit (INDEPENDENT_AMBULATORY_CARE_PROVIDER_SITE_OTHER)

## 2024-06-22 VITALS — BP 103/66 | HR 73

## 2024-06-22 DIAGNOSIS — J381 Polyp of vocal cord and larynx: Secondary | ICD-10-CM | POA: Diagnosis not present

## 2024-06-22 DIAGNOSIS — J340 Abscess, furuncle and carbuncle of nose: Secondary | ICD-10-CM | POA: Diagnosis not present

## 2024-06-22 DIAGNOSIS — R490 Dysphonia: Secondary | ICD-10-CM

## 2024-06-22 NOTE — Progress Notes (Signed)
 Dear Dr. Bernadine, Here is my assessment for our mutual patient, Regina Cantu. Thank you for allowing me the opportunity to care for your patient. Please do not hesitate to contact me should you have any other questions. Sincerely, Dr. Hadassah Parody  Otolaryngology Clinic Note Referring provider: Dr. Bernadine HPI:   Initial HPI (05/28/24) Discussed the use of AI scribe software for clinical note transcription with the patient, who gave verbal consent to proceed.  Regina Cantu is a 43 year old female who presents with a vocal cord lesion discovered incidentally during an upper endoscopy.  Vocal cord lesion - Vocal cord lesion discovered incidentally during upper endoscopy approximately one month ago - No changes in voice - No dysphagia - no pain  Chronic nasal lesion - Persistent scab in left nostril for over twenty years and has worsened over time.  - Frequent manipulation and picking at the lesion - Occasional bleeding from the site  ---------------------------------------------------------  Taken to OR on 06/09/24 for SMDL excisional biopsy of VF lesion.   History of Present Illness --------------------------------------------------------- 06/22/2024  Presents for f/u. She is doing well. Some dysphonia but not bothersome unless trying to yell. Otherwise no issues    Independent Review of Additional Tests or Records:  Surg path 06/09/24 reviewed:  FINAL MICROSCOPIC DIAGNOSIS:   A. 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    PMH/Meds/All/SocHx/FamHx/ROS:   Past Medical History:  Diagnosis Date   2023 2024   Anxiety    Cancer (HCC)    ovarian 2001 and 2010   Depression    Essential hypertension 05/04/2022   Herniated disc, cervical 2011   Hypertension    Migraine    Pityriasis rosea    Pneumonia    Uncontrolled type 2 diabetes mellitus with hyperglycemia (HCC) 05/04/2022   URI, acute 08/17/2022     Past  Surgical History:  Procedure Laterality Date   ABDOMINAL HYSTERECTOMY     CESAREAN SECTION     COLONOSCOPY WITH PROPOFOL  N/A 09/10/2023   Procedure: COLONOSCOPY WITH PROPOFOL ;  Surgeon: Jinny Carmine, MD;  Location: [facility] ENDOSCOPY;  Service: Endoscopy;  Laterality: N/A;   MICROLARYNGOSCOPY WITH LASER N/A 06/09/2024   Procedure: MICROLARYNGOSCOPY, WITH PROCEDURE USING LASER;  Surgeon: Parody Hadassah BROCKS, MD;  Location: [facility] OR;  Service: ENT;  Laterality: N/A;  Microlaryngoscopy with CO2 laser excision of vocal fold lesion and steroid injection   OVARIAN CANCER     TUBAL LIGATION Right    UPPER GI ENDOSCOPY      Family History  Problem Relation Age of Onset   Hyperlipidemia Mother    Hypertension Mother    Diabetes Mother    Stroke Mother    Cerebral aneurysm Mother    Prostate cancer Father    Cerebral aneurysm Father    Cancer Father    Stroke Father    Breast cancer Maternal Aunt 67   Aneurysm Cousin    Heart Problems Cousin    Heart Problems Cousin    Heart Problems Cousin      Social Connections: Moderately Integrated (09/13/2022)   Social Connection and Isolation Panel    Frequency of Communication with Friends and Family: Once a week    Frequency of Social Gatherings with Friends and Family: Once a week    Attends Religious Services: 1 to 4 times per year    Active Member of [employer] or Organizations: No    Attends Banker Meetings: 1 to 4 times per  year    Marital Status: Living with partner     Current Outpatient Medications  Medication Instructions   amLODipine  (NORVASC ) 5 mg, Oral, Daily   atorvastatin  (LIPITOR) 80 mg, Oral, Daily   buPROPion  (WELLBUTRIN  XL) 150 MG 24 hr tablet Take 1  tablet by mouth every morning   busPIRone  (BUSPAR ) 5 MG tablet Take 1 tablet  by mouth twice a day   EPINEPHrine  (EPI-PEN) 0.3 mg, Intramuscular, As needed   famotidine  (PEPCID ) 40 mg, Oral, Daily at bedtime   Farxiga  5 mg, Oral, Daily before breakfast    HYDROcodone -acetaminophen  (NORCO/VICODIN) 5-325 MG tablet 1 tablet, Oral, Every 6 hours PRN   lamoTRIgine (LAMICTAL) 100 MG tablet Take 1 tablet by mouth daily   lidocaine  (LIDODERM ) 5 % 1 patch, Transdermal, Every 24 hours, Remove & Discard patch within 12 hours or as directed by MD   Linzess  290 mcg, Oral, Daily before breakfast   losartan -hydrochlorothiazide  (HYZAAR ) 50-12.5 MG tablet 1 tablet, Oral, Daily   methocarbamol  (ROBAXIN ) 500 mg, Oral, Every 8 hours PRN   Nurtec 75 mg, Oral, Every other day   ondansetron  (ZOFRAN ) 4 mg, Oral, Every 8 hours PRN   pantoprazole  (PROTONIX ) 40 mg, Oral, Daily   topiramate  (TOPAMAX ) 75 mg, Oral, Daily at bedtime   traZODone  (DESYREL ) 100 MG tablet Take 0.5-1 tablets (50-100 mg total) by mouth at bedtime for sleep.     Physical Exam:   BP 103/66   Pulse 73   SpO2 94%   Salient findings:  CN II-XII intact Dysphonic  No respiratory distress or stridor  Seprately Identifiable Procedures:  Prior to initiating any procedures, risks/benefits/alternatives were explained to the patient and verbal consent obtained.  Procedure Note (05/28/24) Pre-procedure diagnosis:  concern for vocal cord lesion  Post-procedure diagnosis: Same Procedure: Transnasal Fiberoptic Laryngoscopy, CPT 31575 - Mod 25 Indication: vocal cord lesion Complications: None apparent EBL: 0 mL     PROCEDURE (05/28/24): Bilateral Diagnostic Rigid Nasal Endoscopy Pre-procedure diagnosis: Concern for nasal cavity irritation Post-procedure diagnosis: nasal septal ulceration Indication: See pre-procedure diagnosis and physical exam above Complications: None apparent EBL: 0 mL Anesthesia: Lidocaine  4% and topical decongestant was topically sprayed in each nasal cavity  Description of Procedure:  Patient was identified. A rigid 30 degree endoscope was utilized to evaluate the sinonasal cavities, mucosa, sinus ostia and turbinates and septum.  Overall, signs of mucosal inflammation  are noted.  Also noted are there is a scab and small ulcer on the left aspect of the nasal septum likely due to repeated digital trauma. There is yellow scab/crusting overlying.  No mucopurulence, polyps, or masses noted.   Right Middle meatus: clear Right SE Recess: clear Left MM: clear Left SE Recess: clear Photodocumentation was obtained.  CPT CODE -- 68768 - Mod 25    Impression & Plans:  Regina Cantu is a 43 y.o. female with   1. Vocal cord polyp   2. Dysphonia   3. 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 No perforation at this time but discussed this could develop into one if she is not able to stop scratching the area.  - given bactroban previously.  reassess at follow-up  - she will try to stop  scratching the area    See below regarding exact medications prescribed this encounter including dosages and route: No orders of the defined types were placed in this encounter.   Thank you for allowing me the opportunity to care for your patient. Please do not hesitate to contact me should you have any other questions.  Sincerely, Hadassah Parody, MD Otolaryngologist (ENT), [facility] Phone: [phone] Fax: [phone]

## 2024-06-24 ENCOUNTER — Ambulatory Visit

## 2024-06-24 DIAGNOSIS — M6281 Muscle weakness (generalized): Secondary | ICD-10-CM

## 2024-06-24 DIAGNOSIS — G8929 Other chronic pain: Secondary | ICD-10-CM

## 2024-06-24 DIAGNOSIS — M25512 Pain in left shoulder: Secondary | ICD-10-CM | POA: Diagnosis not present

## 2024-06-24 DIAGNOSIS — R293 Abnormal posture: Secondary | ICD-10-CM | POA: Diagnosis not present

## 2024-06-24 NOTE — Therapy (Signed)
 OUTPATIENT PHYSICAL THERAPY NOTE   Patient Name: Regina Cantu MRN: [id number] DOB:September 22, 1980, 43 y.o., female Today's Date: 06/24/2024  END OF SESSION:  PT End of Session - 06/24/24 0823     Visit Number 3    Number of Visits 4    Date for Recertification  07/27/24    Authorization Type MCD    PT Start Time 0830    PT Stop Time 0915    PT Time Calculation (min) 45 min    Activity Tolerance Patient limited by pain    Behavior During Therapy [facility] for tasks assessed/performed          PT End of Session - 06/17/24 1221      Visit Number 2    Number of Visits 4     Date for Recertification  07/27/24     Authorization Type MCD     PT Start Time 1220    PT Stop Time 1258    PT Time Calculation (min) 38 min     Activity Tolerance Patient limited by pain     Behavior During Therapy [facility] for tasks assessed/performed       Past Medical History:  Diagnosis Date   2023 2024   Anxiety    Cancer (HCC)    ovarian 2001 and 2010   Depression    Essential hypertension 05/04/2022   Herniated disc, cervical 2011   Hypertension    Migraine    Pityriasis rosea    Pneumonia    Uncontrolled type 2 diabetes mellitus with hyperglycemia (HCC) 05/04/2022   URI, acute 08/17/2022   Past Surgical History:  Procedure Laterality Date   ABDOMINAL HYSTERECTOMY     CESAREAN SECTION     COLONOSCOPY WITH PROPOFOL  N/A 09/10/2023   Procedure: COLONOSCOPY WITH PROPOFOL ;  Surgeon: Jinny Carmine, MD;  Location: [facility] ENDOSCOPY;  Service: Endoscopy;  Laterality: N/A;   MICROLARYNGOSCOPY WITH LASER N/A 06/09/2024   Procedure: MICROLARYNGOSCOPY, WITH PROCEDURE USING LASER;  Surgeon: Greggory Hadassah BROCKS, MD;  Location: [facility] OR;  Service: ENT;  Laterality: N/A;  Microlaryngoscopy with CO2 laser excision of vocal fold lesion and steroid injection   OVARIAN CANCER     TUBAL LIGATION Right    UPPER GI ENDOSCOPY     Patient Active Problem List   Diagnosis Date Noted   Lesion of vocal fold 06/09/2024    Hyperlipidemia 11/11/2023   GERD (gastroesophageal reflux disease) 09/14/2023   Constipation 09/10/2023   LOC (loss of consciousness) (HCC) 08/09/2023   Incontinence of feces 08/09/2023   Back pain 03/18/2023   Abnormal Pap smear of cervix 12/27/2022   Tobacco use 08/17/2022   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   Healthcare maintenance 07/04/2022   Migraine 07/04/2022   History of CVA (cerebrovascular accident) 05/04/2022   History of ovarian cancer 05/04/2022   Type 2 diabetes mellitus (HCC) 05/04/2022   Essential hypertension 05/04/2022   Malignant (primary) neoplasm, unspecified (HCC) 05/24/2021   Other specified disorders of eustachian tube, bilateral 05/24/2021   Allergy, unspecified, initial encounter 05/24/2021    PCP: Bernadine Manos, MD   REFERRING PROVIDER: Jeanelle Layman CROME, MD  REFERRING DIAG: [phone] (ICD-10-CM) - Chronic left shoulder pain  THERAPY DIAG:  Chronic left shoulder pain  Muscle weakness (generalized)  Rationale for Evaluation and Treatment: Rehabilitation  ONSET DATE: chronic  SUBJECTIVE:  SUBJECTIVE STATEMENT: Patient reports pain is 5/10 upon arrival but was 8/10 after getting out of bed. She took a muscle relaxor before PT. She is performing her HEP with some relief during exercises.   Hand dominance: Right  PERTINENT HISTORY: Patient has a past medical history of left shoulder pain.  She has been followed for this since the beginning of the year.  She had an MRI which showed osteoarthritis and tendinopathy.  She has been to physical therapy.  She is followed by orthopedics.  In April, after the treatment of physical therapy, she was doing well.  2 months ago her left shoulder pain came back.  She describes pain with all ranges of motion in her  left shoulder.  She is unable to brush her teeth or brush her hair.  This is not bilateral, so I am not worried for autoimmune process.  On exam, patient has decreased range of motion of all fields on her shoulder joint.  Do wonder if patient had adhesive capsulitis.  She does have an empty can test does make she wonder if she has a supraspinatus injury.  Will refer patient back to orthopedics.  Will refer to physical therapy again.   Plan: - Refer to physical therapy - Refer back to orthopedics, for potential intra-articular injections - Tylenol  4 times daily 1000 mg  - For breakthrough pain, recommended ibuprofen  PAIN:  Are you having pain? Yes: NPRS scale: 10/10 Pain location: L shoulder Pain description: sharp Aggravating factors: activity, reaching, ADLs  Relieving factors: sling and ice  PRECAUTIONS: None  RED FLAGS: None   WEIGHT BEARING RESTRICTIONS: No  FALLS:  Has patient fallen in last 6 months? No  OCCUPATION: Not working  PLOF: Independent  PATIENT GOALS:To manage my shoulder pain  NEXT MD VISIT: TBD  OBJECTIVE:  Note: Objective measures were completed at Evaluation unless otherwise noted.  DIAGNOSTIC FINDINGS:  IMPRESSION: 1. Mild infraspinatus tendinopathy. 2. Mild glenohumeral osteoarthritis. Small geode or degenerative osteochondral lesion of the superior glenoid.     Electronically Signed   By: Ryan Salvage M.D.   On: 09/05/2023 16:43  PATIENT SURVEYS:  Quick Dash: 50/55 89% perceived disability  POSTURE: Elevated L shoulder  UPPER EXTREMITY ROM:   A/PROM Right eval Left eval  Shoulder flexion  60/60dP!  Shoulder extension  10dP!  Shoulder abduction  60/60dP!  Shoulder adduction    Shoulder internal rotation  /40P!  Shoulder external rotation  /0P!  Elbow flexion    Elbow extension    Wrist flexion    Wrist extension    Wrist ulnar deviation    Wrist radial deviation    Wrist pronation    Wrist supination    (Blank rows  = not tested)  UPPER EXTREMITY MMT:  UTA due to pain  MMT Right eval Left eval  Shoulder flexion    Shoulder extension    Shoulder abduction    Shoulder adduction    Shoulder internal rotation    Shoulder external rotation    Middle trapezius    Lower trapezius    Elbow flexion    Elbow extension    Wrist flexion    Wrist extension    Wrist ulnar deviation    Wrist radial deviation    Wrist pronation    Wrist supination    Grip strength (lbs)    (Blank rows = not tested)  SHOULDER SPECIAL TESTS: Impingement tests: UTA due to pain Rotator cuff assessment: UTA due to pain Biceps assessment:  UTA due to pain  JOINT MOBILITY TESTING:  Not tested  PALPATION:  Globally tender throughout shoulder girdle                                                                                                                             TREATMENT:   [facility] Adult PT Treatment:                                                DATE: 06/24/2024  Manual:  PROM shoulder flexion and ABD Grade I posterior and inferior jt mobs glenohumeral  Glenohumeral jt distraction   Therapeutic Exercise: Pulley's flexion x 10  Seated shoulder rolls forward and backward x 15 each  Seated stool roll out into shoulder flexion x 20  Supine ER x 10  Supine horizontal ABD, YTB, x 10   [facility] Adult PT Treatment:                                                DATE: 06/17/2024  Therapeutic Exercise: UBE fwd/3 min back  Prone pendulum  Prone scapular retraction Seated Shrug  Shoulder flexion PROM from prone lying Shoulder flexion PROM from supine lying  Created and reviewed initial HEP     [facility] Adult PT Treatment:                                                DATE: 05/27/24 Eval and HEP Self Care: Additional minutes spent for educating on updated Therapeutic Home Exercise Program as well as comparing current status to condition at start of symptoms. This included exercises focusing on stretching,  strengthening, with focus on eccentric aspects. Long term goals include an improvement in range of motion, strength, endurance as well as avoiding reinjury. Patient's frequency would include in 1-2 times a day, 3-5 times a week for a duration of 6-12 weeks. Proper technique shown and discussed handout in great detail. All questions were discussed and addressed.      PATIENT EDUCATION: Education details: Discussed eval findings, rehab rationale and POC and patient is in agreement  Person educated: Patient Education method: Explanation and Handouts Education comprehension: verbalized understanding and needs further education  HOME EXERCISE PROGRAM: Access Code: [email or web address] URL: [email or web address] Date: 06/19/2024 Prepared by: Marko Molt  Exercises - Seated Shoulder Pendulum Exercise  - 2 x daily - 7 x weekly - 2 sets - 30 sec hold - Seated Shoulder Shrugs  - 2 x daily - 7 x weekly - 2 sets - 10 reps - 3 sec hold - Seated  Scapular Retraction  - 2 x daily - 7 x weekly - 2 sets - 10 reps - 3 sec hold - Scaption Wall Slide with Towel  - 2 x daily - 7 x weekly - 2 sets - 10 reps - 3 sec hold  ASSESSMENT:  CLINICAL IMPRESSION: Trailed manual techniques today to assist with pain relief and ROM increase. Pt guarding the majority of PROM but distraction did relieve pain. Poor response to AROM and gentle strengthening exercises. Pt exited with slight increase in pain and fatigue of shoulder. She will continue to benefit from skilled physical therapy to reduce pain and increase function.   OBJECTIVE IMPAIRMENTS: decreased knowledge of condition, decreased mobility, decreased ROM, decreased strength, impaired UE functional use, postural dysfunction, obesity, and pain.   ACTIVITY LIMITATIONS: carrying, lifting, sleeping, bed mobility, dressing, and reach over head  PERSONAL FACTORS: Age, Past/current experiences, and Time since onset of injury/illness/exacerbation are also affecting  patient's functional outcome.   REHAB POTENTIAL: Fair based on pain and no distinct relieving position  CLINICAL DECISION MAKING: Stable/uncomplicated  EVALUATION COMPLEXITY: Moderate   GOALS: Goals reviewed with patient? No  SHORT TERM GOALS: Target date: 06/24/2024    Patient to demonstrate independence in HEP  Baseline: TBD Goal status: INITIAL  2.  Patient will score at least 40/55 on qDSAH to signify clinically meaningful improvement in functional abilities.   Baseline: 50/55 Goal status: INITIAL     PLAN:  PT FREQUENCY: 1-2x/week  PT DURATION: 6 weeks  PLANNED INTERVENTIONS: 97110-Therapeutic exercises, 97530- Therapeutic activity, 97112- Neuromuscular re-education, 97535- Self Care, 02859- Manual therapy, and Patient/Family education  PLAN FOR NEXT SESSION: HEP review and update, manual techniques as appropriate, aerobic tasks, ROM and flexibility activities, strengthening and PREs, TPDN, gait and balance training as needed   For all possible CPT codes, reference the Planned Interventions line above.     Check all conditions that are expected to impact treatment: {Conditions expected to impact treatment:None of these apply   If treatment provided at initial evaluation, no treatment charged due to lack of authorization.       Marijo Berber PT, DPT  06/24/2024 9:24 AM

## 2024-06-29 ENCOUNTER — Other Ambulatory Visit (HOSPITAL_COMMUNITY): Payer: Self-pay

## 2024-06-29 ENCOUNTER — Other Ambulatory Visit: Payer: Self-pay

## 2024-06-29 NOTE — Therapy (Addendum)
 " OUTPATIENT PHYSICAL THERAPY NOTE/DISCHARGE   Patient Name: Regina Cantu MRN: [id number] DOB:Apr 08, 1981, 43 y.o., female Today's Date: 07/01/2024  END OF SESSION:  PT End of Session - 07/01/24 1223     Visit Number 4    Number of Visits 4    Date for Recertification  07/27/24    Authorization Type MCD    PT Start Time 1220    PT Stop Time 1300    PT Time Calculation (min) 40 min    Activity Tolerance Patient limited by pain    Behavior During Therapy [facility] for tasks assessed/performed           Past Medical History:  Diagnosis Date   2023 2024   Anxiety    Cancer (HCC)    ovarian 2001 and 2010   Depression    Essential hypertension 05/04/2022   Herniated disc, cervical 2011   Hypertension    Migraine    Pityriasis rosea    Pneumonia    Uncontrolled type 2 diabetes mellitus with hyperglycemia (HCC) 05/04/2022   URI, acute 08/17/2022   Past Surgical History:  Procedure Laterality Date   ABDOMINAL HYSTERECTOMY     CESAREAN SECTION     COLONOSCOPY WITH PROPOFOL  N/A 09/10/2023   Procedure: COLONOSCOPY WITH PROPOFOL ;  Surgeon: Jinny Carmine, MD;  Location: [facility] ENDOSCOPY;  Service: Endoscopy;  Laterality: N/A;   MICROLARYNGOSCOPY WITH LASER N/A 06/09/2024   Procedure: MICROLARYNGOSCOPY, WITH PROCEDURE USING LASER;  Surgeon: Greggory Hadassah BROCKS, MD;  Location: [facility] OR;  Service: ENT;  Laterality: N/A;  Microlaryngoscopy with CO2 laser excision of vocal fold lesion and steroid injection   OVARIAN CANCER     TUBAL LIGATION Right    UPPER GI ENDOSCOPY     Patient Active Problem List   Diagnosis Date Noted   Lesion of vocal fold 06/09/2024   Hyperlipidemia 11/11/2023   GERD (gastroesophageal reflux disease) 09/14/2023   Constipation 09/10/2023   LOC (loss of consciousness) (HCC) 08/09/2023   Incontinence of feces 08/09/2023   Back pain 03/18/2023   Abnormal Pap smear of cervix 12/27/2022   Tobacco use 08/17/2022   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    Healthcare maintenance 07/04/2022   Migraine 07/04/2022   History of CVA (cerebrovascular accident) 05/04/2022   History of ovarian cancer 05/04/2022   Type 2 diabetes mellitus (HCC) 05/04/2022   Essential hypertension 05/04/2022   Malignant (primary) neoplasm, unspecified (HCC) 05/24/2021   Other specified disorders of eustachian tube, bilateral 05/24/2021   Allergy, unspecified, initial encounter 05/24/2021    PCP: Bernadine Manos, MD   REFERRING PROVIDER: Jeanelle Layman CROME, MD  REFERRING DIAG: [phone] (ICD-10-CM) - Chronic left shoulder pain  THERAPY DIAG:  Chronic left shoulder pain  Muscle weakness (generalized)  Abnormal posture  Rationale for Evaluation and Treatment: Rehabilitation  ONSET DATE: chronic  SUBJECTIVE:  SUBJECTIVE STATEMENT:  Continued high levels of shoulder pain, no relieving factors noted. Hand dominance: Right  PERTINENT HISTORY: Patient has a past medical history of left shoulder pain.  She has been followed for this since the beginning of the year.  She had an MRI which showed osteoarthritis and tendinopathy.  She has been to physical therapy.  She is followed by orthopedics.  In April, after the treatment of physical therapy, she was doing well.  2 months ago her left shoulder pain came back.  She describes pain with all ranges of motion in her left shoulder.  She is unable to brush her teeth or brush her hair.  This is not bilateral, so I am not worried for autoimmune process.  On exam, patient has decreased range of motion of all fields on her shoulder joint.  Do wonder if patient had adhesive capsulitis.  She does have an empty can test does make she wonder if she has a supraspinatus injury.  Will refer patient back to orthopedics.  Will refer to physical therapy  again.   Plan: - Refer to physical therapy - Refer back to orthopedics, for potential intra-articular injections - Tylenol  4 times daily 1000 mg  - For breakthrough pain, recommended ibuprofen  PAIN:  Are you having pain? Yes: NPRS scale: 10/10 Pain location: L shoulder Pain description: sharp Aggravating factors: activity, reaching, ADLs  Relieving factors: sling and ice  PRECAUTIONS: None  RED FLAGS: None   WEIGHT BEARING RESTRICTIONS: No  FALLS:  Has patient fallen in last 6 months? No  OCCUPATION: Not working  PLOF: Independent  PATIENT GOALS:To manage my shoulder pain  NEXT MD VISIT: TBD  OBJECTIVE:  Note: Objective measures were completed at Evaluation unless otherwise noted.  DIAGNOSTIC FINDINGS:  IMPRESSION: 1. Mild infraspinatus tendinopathy. 2. Mild glenohumeral osteoarthritis. Small geode or degenerative osteochondral lesion of the superior glenoid.     Electronically Signed   By: Ryan Salvage M.D.   On: 09/05/2023 16:43  PATIENT SURVEYS:  Quick Dash: 50/55 89% perceived disability  POSTURE: Elevated L shoulder  UPPER EXTREMITY ROM:   A/PROM Right eval Left eval  Shoulder flexion  60/60dP!  Shoulder extension  10dP!  Shoulder abduction  60/60dP!  Shoulder adduction    Shoulder internal rotation  /40P!  Shoulder external rotation  /0P!  Elbow flexion    Elbow extension    Wrist flexion    Wrist extension    Wrist ulnar deviation    Wrist radial deviation    Wrist pronation    Wrist supination    (Blank rows = not tested)  UPPER EXTREMITY MMT:  UTA due to pain  MMT Right eval Left eval  Shoulder flexion    Shoulder extension    Shoulder abduction    Shoulder adduction    Shoulder internal rotation    Shoulder external rotation    Middle trapezius    Lower trapezius    Elbow flexion    Elbow extension    Wrist flexion    Wrist extension    Wrist ulnar deviation    Wrist radial deviation    Wrist pronation     Wrist supination    Grip strength (lbs)    (Blank rows = not tested)  SHOULDER SPECIAL TESTS: Impingement tests: UTA due to pain Rotator cuff assessment: UTA due to pain Biceps assessment: UTA due to pain  JOINT MOBILITY TESTING:  Not tested  PALPATION:  Globally tender throughout shoulder girdle  TREATMENT:  [facility] Adult PT Treatment:                                                DATE: 07/01/24 Therapeutic Exercise: Nustep L2 8 min Therapeutic Activity: ER YTB 15x2 qDASH retake Joint mobs to tolerance L pec minor release 2 min  [facility] Adult PT Treatment:                                                DATE: 06/24/2024  Manual:  PROM shoulder flexion and ABD Grade I posterior and inferior jt mobs glenohumeral  Glenohumeral jt distraction   Therapeutic Exercise: Pulley's flexion x 10  Seated shoulder rolls forward and backward x 15 each  Seated stool roll out into shoulder flexion x 20  Supine ER x 10  Supine horizontal ABD, YTB, x 10   [facility] Adult PT Treatment:                                                DATE: 06/17/2024  Therapeutic Exercise: UBE fwd/3 min back  Prone pendulum  Prone scapular retraction Seated Shrug  Shoulder flexion PROM from prone lying Shoulder flexion PROM from supine lying  Created and reviewed initial HEP     [facility] Adult PT Treatment:                                                DATE: 05/27/24 Eval and HEP Self Care: Additional minutes spent for educating on updated Therapeutic Home Exercise Program as well as comparing current status to condition at start of symptoms. This included exercises focusing on stretching, strengthening, with focus on eccentric aspects. Long term goals include an improvement in range of motion, strength, endurance as well as avoiding reinjury. Patient's frequency would include in 1-2  times a day, 3-5 times a week for a duration of 6-12 weeks. Proper technique shown and discussed handout in great detail. All questions were discussed and addressed.      PATIENT EDUCATION: Education details: Discussed eval findings, rehab rationale and POC and patient is in agreement  Person educated: Patient Education method: Explanation and Handouts Education comprehension: verbalized understanding and needs further education  HOME EXERCISE PROGRAM: Access Code: [email or web address] URL: [email or web address] Date: 06/19/2024 Prepared by: Marko Molt  Exercises - Seated Shoulder Pendulum Exercise  - 2 x daily - 7 x weekly - 2 sets - 30 sec hold - Seated Shoulder Shrugs  - 2 x daily - 7 x weekly - 2 sets - 10 reps - 3 sec hold - Seated Scapular Retraction  - 2 x daily - 7 x weekly - 2 sets - 10 reps - 3 sec hold - Scaption Wall Slide with Towel  - 2 x daily - 7 x weekly - 2 sets - 10 reps - 3 sec hold  ASSESSMENT:  CLINICAL IMPRESSION:  No gains in ROM, strength or function.  Pain awakens her at night.  Unable  to cite relieving factors.  Symptoms localized to L AC joint.  Recommend return to MD for f/u and potential imaging studies and [facility] joint assessment.  OBJECTIVE IMPAIRMENTS: decreased knowledge of condition, decreased mobility, decreased ROM, decreased strength, impaired UE functional use, postural dysfunction, obesity, and pain.   ACTIVITY LIMITATIONS: carrying, lifting, sleeping, bed mobility, dressing, and reach over head  PERSONAL FACTORS: Age, Past/current experiences, and Time since onset of injury/illness/exacerbation are also affecting patient's functional outcome.   REHAB POTENTIAL: Fair based on pain and no distinct relieving position  CLINICAL DECISION MAKING: Stable/uncomplicated  EVALUATION COMPLEXITY: Moderate   GOALS: Goals reviewed with patient? No  SHORT TERM GOALS: Target date: 06/24/2024    Patient to demonstrate independence in HEP  Baseline:  TBD; 07/01/24 [email or web address] Goal status: Met  2.  Patient will score at least 40/55 on qDSAH to signify clinically meaningful improvement in functional abilities.   Baseline: 50/55; 07/01/24 41/55 Goal status: Not met     PLAN:  PT FREQUENCY: 1-2x/week  PT DURATION: 6 weeks  PLANNED INTERVENTIONS: 97110-Therapeutic exercises, 97530- Therapeutic activity, 97112- Neuromuscular re-education, 97535- Self Care, 02859- Manual therapy, and Patient/Family education  PLAN FOR NEXT SESSION: HEP review and update, manual techniques as appropriate, aerobic tasks, ROM and flexibility activities, strengthening and PREs, TPDN, gait and balance training as needed   For all possible CPT codes, reference the Planned Interventions line above.     Check all conditions that are expected to impact treatment: {Conditions expected to impact treatment:None of these apply   If treatment provided at initial evaluation, no treatment charged due to lack of authorization.       Jeff Zafira Munos PT  07/01/2024 1:12 PM  "

## 2024-07-01 ENCOUNTER — Ambulatory Visit: Payer: Self-pay

## 2024-07-01 DIAGNOSIS — R293 Abnormal posture: Secondary | ICD-10-CM | POA: Diagnosis not present

## 2024-07-01 DIAGNOSIS — G8929 Other chronic pain: Secondary | ICD-10-CM | POA: Diagnosis not present

## 2024-07-01 DIAGNOSIS — M6281 Muscle weakness (generalized): Secondary | ICD-10-CM

## 2024-07-01 DIAGNOSIS — M25512 Pain in left shoulder: Secondary | ICD-10-CM | POA: Diagnosis not present

## 2024-07-06 ENCOUNTER — Telehealth: Payer: Self-pay | Admitting: *Deleted

## 2024-07-06 ENCOUNTER — Ambulatory Visit: Admitting: Orthopedic Surgery

## 2024-07-06 NOTE — Telephone Encounter (Signed)
 Will forward to C. Shirlean and [place], [place]                                                      Copied from [employer] [id number]. Topic: Referral - Question >> Jul 06, 2024 10:57 AM Regina Cantu wrote: Reason for CRM: Patient is calling in stating that she has an appointment tomorrow at 11:30 at [facility] for the Neurology referral and they are missing the notes behind the referral and the reason why she is going. Patient needs it or they will cancel the appointment. Please advise.

## 2024-07-06 NOTE — Telephone Encounter (Signed)
 Information has been sent today per C. Boone. Copied from CRM #[id number]. Topic: Referral - Question >> Jul 06, 2024 10:57 AM Chiquita SQUIBB wrote: Reason for CRM: Patient is calling in stating that she has an appointment tomorrow at 11:30 at [facility] for the Neurology referral and they are missing the notes behind the referral and the reason why she is going. Patient needs it or they will cancel the appointment. Please advise. >> Jul 06, 2024  2:11 PM Diannia H wrote: Patient is calling in stating that she has an appointment tomorrow at 11:30 at [facility] for the Neurology referral and they are missing the notes behind the referral and the reason why she is going. Patient needs it or they will cancel the appointment. Please advise. Could you contact the patient and let them know when this is completed. Callback number is [phone].

## 2024-07-07 NOTE — Telephone Encounter (Signed)
 Call from pt States [facility] (neurology) canceled her appt because they needed all her records sent to them. Pt states Heather will call back with an appt once all records were received. Will send message to referral coordinator for follow up

## 2024-07-16 ENCOUNTER — Other Ambulatory Visit: Payer: Self-pay

## 2024-07-16 ENCOUNTER — Other Ambulatory Visit (HOSPITAL_COMMUNITY): Payer: Self-pay

## 2024-07-20 ENCOUNTER — Ambulatory Visit: Admitting: Orthopedic Surgery

## 2024-07-22 ENCOUNTER — Other Ambulatory Visit (HOSPITAL_COMMUNITY): Payer: Self-pay

## 2024-07-24 ENCOUNTER — Ambulatory Visit (INDEPENDENT_AMBULATORY_CARE_PROVIDER_SITE_OTHER)

## 2024-08-25 ENCOUNTER — Other Ambulatory Visit: Payer: Self-pay | Admitting: Medical Genetics

## 2024-08-25 DIAGNOSIS — Z006 Encounter for examination for normal comparison and control in clinical research program: Secondary | ICD-10-CM

## 2024-08-27 ENCOUNTER — Telehealth: Payer: Self-pay | Admitting: Neurology

## 2024-08-27 ENCOUNTER — Ambulatory Visit: Admitting: Neurology

## 2024-08-27 ENCOUNTER — Encounter: Payer: Self-pay | Admitting: Neurology

## 2024-08-27 VITALS — BP 134/86 | HR 84 | Ht 62.0 in | Wt 208.4 lb

## 2024-08-27 DIAGNOSIS — Z8673 Personal history of transient ischemic attack (TIA), and cerebral infarction without residual deficits: Secondary | ICD-10-CM | POA: Diagnosis not present

## 2024-08-27 DIAGNOSIS — Z9189 Other specified personal risk factors, not elsewhere classified: Secondary | ICD-10-CM | POA: Diagnosis not present

## 2024-08-27 DIAGNOSIS — G43719 Chronic migraine without aura, intractable, without status migrainosus: Secondary | ICD-10-CM

## 2024-08-27 DIAGNOSIS — R519 Headache, unspecified: Secondary | ICD-10-CM

## 2024-08-27 MED ORDER — NURTEC 75 MG PO TBDP: 75.0000 mg | ORAL_TABLET | Freq: Every day | ORAL | 1 refills | Status: AC | PRN

## 2024-08-27 MED ORDER — TOPIRAMATE 100 MG PO TABS: 100.0000 mg | ORAL_TABLET | Freq: Two times a day (BID) | ORAL | 3 refills | Status: AC

## 2024-08-27 NOTE — Progress Notes (Signed)
 " [facility] [address] [place] [id number] [phone]       OFFICE CONSULT NOTE  Regina Cantu Date of Birth:  Mar 27, 1981 Medical Record Number:  [id number]   Referring MD: Layman Bee  Reason for Referral: Migraines  HPI: Ms. [name] is a pleasant 44 year old African-American lady seen today for office consultation visit for headache.  History is obtained from the patient and review of electronic medical records.  I have reviewed pertinent available imaging films in PACS.  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  Patient states she has had migraine headaches since she was age 71.  Previously infrequent but for the last couple of years headaches have been increasing in frequency and severity now occur almost daily.  She describes almost a constant daily headache which is pressure-like and moderate 5/10 in intensity.  It can be variable in location at times throughout the back of the head, bifrontal or in the temples.  The headache is mostly a feeling of tightness and pressure-like but can become throbbing and 10 out of 10 in severity with accompanying nausea and occasional vomiting.  She does get lightheaded and concentricity as well.  He denies any zigzag lines but occasionally sees white spots.  Headaches are often triggered by strong smells, bright lights and loud sounds.  He has tried over-the-counter medications which did not help.  Of note she is been taking Nurtec 75 mg for symptomatic relief which works for headache, sweats.  For the last several months she has has been on Topamax  currently on 75 mg twice daily which seems to have helped and makes her headaches tolerable though headaches are not gone.  She is also tried some Robaxin  for shoulder pain and spasms which also helped her sleep at night.  Patient denies any focal neurological  symptoms with her headaches.  MRI scan of the brain on 11/29/2022 shows old right basal ganglia lacunar infarct.  CT angiogram shows mild mid basilar and left P2 posterior cerebral artery.  Unchanged from September 2023.  She remains on aspirin  for stroke prevention and has not had any recurrent stroke or TIA symptoms since September 2023.  Continues to have residual left-sided numbness and left hand weakness with diminished fine motor skills.  She states her blood pressure is under good control.  Her diabetes is also under good control and last hemoglobin A1c on 05/14/2024 was 6.0.  She is tolerating Lipitor well without any side effects.  Has not had a recent lipid profile checked.  There is no family history of migraines.  There is family history of strokes in both her parents.  She is a chronic smoker but has cut back and now smokes half pack per day.  She also admits to using marijuana  ROS:   14 system review of systems is positive for headache, light sensitivity, numbness, weakness shoulder pain, difficulty sleeping all other systems negative  PMH:  Past Medical History:  Diagnosis Date   2023 2024   Anxiety    Cancer (HCC)    ovarian 2001 and 2010   Depression    Essential hypertension 05/04/2022   Herniated disc, cervical 2011   Hypertension    Migraine    Pityriasis rosea    Pneumonia    Uncontrolled type 2 diabetes mellitus with hyperglycemia (HCC) 05/04/2022   URI, acute 08/17/2022    Social History:  Social History  Socioeconomic History   Marital status: Single    Spouse name: Not on file   Number of children: Not on file   Years of education: Not on file   Highest education level: Not on file  Occupational History   Not on file  Tobacco Use   Smoking status: Every Day    Current packs/day: 0.50    Average packs/day: 0.5 packs/day for 26.2 years (13.1 ttl pk-yrs)    Types: Cigarettes    Start date: 06/06/1998   Smokeless tobacco: Never   Tobacco comments:    3  cigs per day  Vaping Use   Vaping status: Never Used  Substance and Sexual Activity   Alcohol use: Not Currently    Comment: rare   Drug use: Yes    Types: Marijuana    Comment: smokes marijuana every day   Sexual activity: Not Currently    Partners: Male  Other Topics Concern   Not on file  Social History Narrative   Caffiene rare   Working:  not right now,  trying to get job   Lives with partner.   Social Drivers of Health   Tobacco Use: High Risk (07/01/2024)   Patient History    Smoking Tobacco Use: Every Day    Smokeless Tobacco Use: Never    Passive Exposure: Not on file  Financial Resource Strain: Medium Risk (09/13/2022)   Overall Financial Resource Strain (CARDIA)    Difficulty of Paying Living Expenses: Somewhat hard  Food Insecurity: Food Insecurity Present (09/13/2022)   Hunger Vital Sign    Worried About Running Out of Food in the Last Year: Often true    Ran Out of Food in the Last Year: Often true  Transportation Needs: No Transportation Needs (09/13/2022)   PRAPARE - Administrator, Civil Service (Medical): No    Lack of Transportation (Non-Medical): No  Physical Activity: Insufficiently Active (02/12/2024)   Exercise Vital Sign    Days of Exercise per Week: 3 days    Minutes of Exercise per Session: 30 min  Stress: Stress Concern Present (09/13/2022)   [employer] of Occupational Health - Occupational Stress Questionnaire    Feeling of Stress : Very much  Social Connections: Moderately Integrated (09/13/2022)   Social Connection and Isolation Panel    Frequency of Communication with Friends and Family: Once a week    Frequency of Social Gatherings with Friends and Family: Once a week    Attends Religious Services: 1 to 4 times per year    Active Member of Clubs or Organizations: No    Attends Banker Meetings: 1 to 4 times per year    Marital Status: Living with partner  Intimate Partner Violence: Not At Risk (09/13/2022)    Humiliation, Afraid, Rape, and Kick questionnaire    Fear of Current or Ex-Partner: No    Emotionally Abused: No    Physically Abused: No    Sexually Abused: No  Depression (PHQ2-9): High Risk (02/12/2024)   Depression (PHQ2-9)    PHQ-2 Score: 22  Alcohol Screen: Low Risk (02/12/2024)   Alcohol Screen    Last Alcohol Screening Score (AUDIT): 0  Housing: Low Risk (09/13/2022)   Housing    Last Housing Risk Score: 0  Utilities: Not At Risk (09/13/2022)   AHC Utilities    Threatened with loss of utilities: No  Health Literacy: Adequate Health Literacy (02/12/2024)   B1300 Health Literacy    Frequency of need for help with medical  instructions: Never    Medications:  Medications Ordered Prior to Encounter[1]  Allergies:  Allergies[2]  Physical Exam General: Obese pleasant middle-age African-American lady, seated, in no evident distress Head: head normocephalic and atraumatic.   Neck: supple with no carotid or supraclavicular bruits Cardiovascular: regular rate and rhythm, no murmurs Musculoskeletal: no deformity left shoulder pain limits elevation. Skin:  no rash/petichiae Vascular:  Normal pulses all extremities  Neurologic Exam Mental Status: Awake and fully alert. Oriented to place and time. Recent and remote memory intact. Attention span, concentration and fund of knowledge appropriate. Mood and affect appropriate.  Cranial Nerves: Fundoscopic exam reveals sharp disc margins. Pupils equal, briskly reactive to light. Extraocular movements full without nystagmus. Visual fields full to confrontation. Hearing intact. Facial sensation intact.  Mild left nasolabial asymmetry when she smiles., tongue, palate moves normally and symmetrically.  Motor: Normal bulk and tone. Normal strength in all tested extremity muscles.  Mild weakness of left grip and intrinsic hand muscles.  Always right over left upper extremity. Sensory.:  Subjective diminished left hemibody touch , pinprick , position and  vibratory sensation.  Coordination: Rapid alternating movements normal in all extremities. Finger-to-nose and heel-to-shin performed accurately bilaterally. Gait and Station: Arises from chair without difficulty. Stance is normal. Gait demonstrates normal stride length and balance but diminished left arm swing and slight dragging of left leg..  Not able to to heel, toe and tandem walk without difficulty.  Reflexes: 1+ and symmetric. Toes downgoing.   NIHSS  2 Modified Rankin  2   ASSESSMENT: 44 year old lady with longstanding history of mixed migraine headaches With tension headache which have progressed in the last few years  to almost daily headaches.  Remote history of right basal ganglia lacunar infarct from small vessel disease in September 2023 with mild left-sided weakness and sensory deficit..  Vascular risk factors of diabetes, hypertension and hyperlipidemia , smoking, marijuana use, obesity , mild intracranial stenosis and at risk for sleep apnea.     PLAN:I had a long d/w patient about longstanding transformed mixed migraine and tension headache which are now almost daily, remote history of lacunar stroke,, risk for recurrent stroke/TIAs, personally independently reviewed imaging studies and stroke evaluation results and answered questions.Continue aspirin  81 mg daily  for secondary stroke prevention and maintain strict control of hypertension with blood pressure goal below 130/90, diabetes with hemoglobin A1c goal below 6.5% and lipids with LDL cholesterol goal below 70 mg/dL. I also advised the patient to eat a healthy diet with plenty of whole grains, cereals, fruits and vegetables, exercise regularly and maintain ideal body weight.  I counseled her to quit smoking completely and do not use marijuana.  Check MRI scan of the brain, lipid profile hemoglobin A1c and polysomnogram for sleep apnea.  Continue Nurtec 75 mg daily for symptomatic relief as needed and creased dose of Topamax  to  100 mg twice daily for headache prophylaxis.  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  I also encouraged her to do regular neck stretching exercises and to participate in daily activities for stress relaxation.  Followup in the future with my nurse practitioner in 3 months or call earlier if necessary    I personally spent a total of 60 minutes in the care of the patient today including getting/reviewing separately obtained history, performing a medically appropriate exam/evaluation, counseling and educating, placing orders, referring and communicating with other health care professionals, documenting clinical information in the EHR, independently interpreting results, and coordinating care.  Eather Popp, MD   Note: This document was prepared with digital dictation and possible smart phrase technology. Any transcriptional errors that result from this process are unintentional.      [1]  Current Outpatient Medications on File Prior to Visit  Medication Sig Dispense Refill   amLODipine  (NORVASC ) 5 MG tablet Take 1 tablet (5 mg total) by mouth daily. 90 tablet 3   atorvastatin  (LIPITOR) 80 MG tablet Take 1 tablet (80 mg total) by mouth daily. 90 tablet 3   buPROPion  (WELLBUTRIN  XL) 150 MG 24 hr tablet Take 1  tablet by mouth every morning 30 tablet 2   busPIRone  (BUSPAR ) 5 MG tablet Take 1 tablet  by mouth twice a day (Patient taking differently: Take 5 mg by mouth daily as needed (anxiety).) 60 tablet 2   EPINEPHrine  0.3 mg/0.3 mL IJ SOAJ injection Inject 0.3 mg into the muscle as needed for anaphylaxis. 1 each 2   famotidine  (PEPCID ) 40 MG tablet Take 1 tablet (40 mg total) by mouth at bedtime. 90 tablet 3   FARXIGA  5 MG TABS tablet Take 1 tablet (5 mg total) by mouth daily before breakfast. 30 tablet 6   lamoTRIgine  (LAMICTAL ) 100 MG tablet Take 1 tablet by mouth daily 30 tablet 2   lidocaine  (LIDODERM ) 5 % Place 1 patch onto the skin daily. Remove & Discard patch within 12  hours or as directed by MD (Patient taking differently: Place 1 patch onto the skin See admin instructions. Every 12 hours or as needed) 30 patch 0   linaclotide  (LINZESS ) 290 MCG CAPS capsule Take 1 capsule (290 mcg total) by mouth daily before breakfast. 90 capsule 3   losartan -hydrochlorothiazide  (HYZAAR ) 50-12.5 MG tablet Take 1 tablet by mouth daily. 90 tablet 3   methocarbamol  (ROBAXIN ) 500 MG tablet Take 1 tablet (500 mg total) by mouth every 8 (eight) hours as needed (muscle spasms, pain). (Patient taking differently: Take 500 mg by mouth every 8 (eight) hours.) 60 tablet 0   pantoprazole  (PROTONIX ) 40 MG tablet Take 1 tablet (40 mg total) by mouth daily. 90 tablet 3   traZODone  (DESYREL ) 100 MG tablet Take 0.5-1 tablets (50-100 mg total) by mouth at bedtime for sleep. 30 tablet 2   HYDROcodone -acetaminophen  (NORCO/VICODIN) 5-325 MG tablet Take 1 tablet by mouth every 6 (six) hours as needed for severe pain (pain score 7-10). (Patient not taking: Reported on 08/27/2024) 10 tablet 0   ondansetron  (ZOFRAN ) 4 MG tablet Take 1 tablet (4 mg total) by mouth every 8 (eight) hours as needed for nausea or vomiting. (Patient not taking: Reported on 08/27/2024) 15 tablet 2   No current facility-administered medications on file prior to visit.  [2]  Allergies Allergen Reactions   Bee Venom Anaphylaxis   Mushroom Extract Complex (Obsolete) Anaphylaxis   Penicillins Hives, Nausea And Vomiting and Rash   Wasp Venom Anaphylaxis   Latex Rash   "

## 2024-08-27 NOTE — Patient Instructions (Signed)
 I had a long d/w patient about longstanding transformed mixed migraine and tension headache which are now almost daily, remote history of lacunar stroke,, risk for recurrent stroke/TIAs, personally independently reviewed imaging studies and stroke evaluation results and answered questions.Continue aspirin  81 mg daily  for secondary stroke prevention and maintain strict control of hypertension with blood pressure goal below 130/90, diabetes with hemoglobin A1c goal below 6.5% and lipids with LDL cholesterol goal below 70 mg/dL. I also advised the patient to eat a healthy diet with plenty of whole grains, cereals, fruits and vegetables, exercise regularly and maintain ideal body weight.  Check MRI scan of the brain, lipid profile hemoglobin A1c and polysomnogram for sleep apnea.  Continue Nurtec 75 mg daily for symptomatic relief as needed and creased dose of Topamax  to 100 mg twice daily for headache prophylaxis.  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  I also encouraged her to do regular neck stretching exercises and to participate in daily activities for stress relaxation.  Followup in the future with my nurse practitioner in 3 months or call earlier if necessary  Neck Exercises Ask your health care provider which exercises are safe for you. Do exercises exactly as told by your health care provider and adjust them as directed. It is normal to feel mild stretching, pulling, tightness, or discomfort as you do these exercises. Stop right away if you feel sudden pain or your pain gets worse. Do not begin these exercises until told by your health care provider. Neck exercises can be important for many reasons. They can improve strength and maintain flexibility in your neck, which will help your upper back and prevent neck pain. Stretching exercises Rotation neck stretching  Sit in a chair or stand up. Place your feet flat on the floor, shoulder-width apart. Slowly turn your head (rotate) to  the right until a slight stretch is felt. Turn it all the way to the right so you can look over your right shoulder. Do not tilt or tip your head. Hold this position for 10-30 seconds. Slowly turn your head (rotate) to the left until a slight stretch is felt. Turn it all the way to the left so you can look over your left shoulder. Do not tilt or tip your head. Hold this position for 10-30 seconds. Repeat __________ times. Complete this exercise __________ times a day. Neck retraction  Sit in a sturdy chair or stand up. Look straight ahead. Do not bend your neck. Use your fingers to push your chin backward (retraction). Do not bend your neck for this movement. Continue to face straight ahead. If you are doing the exercise properly, you will feel a slight sensation in your throat and a stretch at the back of your neck. Hold the stretch for 1-2 seconds. Repeat __________ times. Complete this exercise __________ times a day. Strengthening exercises Neck press  Lie on your back on a firm bed or on the floor with a pillow under your head. Use your neck muscles to push your head down on the pillow and straighten your spine. Hold the position as well as you can. Keep your head facing up (in a neutral position) and your chin tucked. Slowly count to 5 while holding this position. Repeat __________ times. Complete this exercise __________ times a day. Isometrics These are exercises in which you strengthen the muscles in your neck while keeping your neck still (isometrics). Sit in a supportive chair and place your hand on your forehead. Keep  your head and face facing straight ahead. Do not flex or extend your neck while doing isometrics. Push forward with your head and neck while pushing back with your hand. Hold for 10 seconds. Do the sequence again, this time putting your hand against the back of your head. Use your head and neck to push backward against the hand pressure. Finally, do the same  exercise on either side of your head, pushing sideways against the pressure of your hand. Repeat __________ times. Complete this exercise __________ times a day. Prone head lifts  Lie face-down (prone position), resting on your elbows so that your chest and upper back are raised. Start with your head facing downward, near your chest. Position your chin either on or near your chest. Slowly lift your head upward. Lift until you are looking straight ahead. Then continue lifting your head as far back as you can comfortably stretch. Hold your head up for 5 seconds. Then slowly lower it to your starting position. Repeat __________ times. Complete this exercise __________ times a day. Supine head lifts  Lie on your back (supine position), bending your knees to point to the ceiling and keeping your feet flat on the floor. Lift your head slowly off the floor, raising your chin toward your chest. Hold for 5 seconds. Repeat __________ times. Complete this exercise __________ times a day. Scapular retraction  Stand with your arms at your sides. Look straight ahead. Slowly pull both shoulders (scapulae) backward and downward (retraction) until you feel a stretch between your shoulder blades in your upper back. Hold for 10-30 seconds. Relax and repeat. Repeat __________ times. Complete this exercise __________ times a day. Contact a health care provider if: Your neck pain or discomfort gets worse when you do an exercise. Your neck pain or discomfort does not improve within 2 hours after you exercise. If you have any of these problems, stop exercising right away. Do not do the exercises again unless your health care provider says that you can. Get help right away if: You develop sudden, severe neck pain. If this happens, stop exercising right away. Do not do the exercises again unless your health care provider says that you can. This information is not intended to replace advice given to you by your  health care provider. Make sure you discuss any questions you have with your health care provider. Document Revised: 01/17/2021 Document Reviewed: 01/17/2021 Elsevier Patient Education  2024 [employer]  Stroke Prevention Some medical conditions and behaviors can lead to a higher chance of having a stroke. You can help prevent a stroke by eating healthy, exercising, not smoking, and managing any medical conditions you have. Stroke is a leading cause of functional impairment. Primary prevention is particularly important because a majority of strokes are first-time events. Stroke changes the lives of not only those who experience a stroke but also their family and other caregivers. How can this condition affect me? A stroke is a medical emergency and should be treated right away. A stroke can lead to brain damage and can sometimes be life-threatening. If a person gets medical treatment right away, there is a better chance of surviving and recovering from a stroke. What can increase my risk? The following medical conditions may increase your risk of a stroke: Cardiovascular disease. High blood pressure (hypertension). Diabetes. High cholesterol. Sickle cell disease. Blood clotting disorders (hypercoagulable state). Obesity. Sleep disorders (obstructive sleep apnea). Other risk factors include: Being older than age 22. Having a history of blood clots,  stroke, or mini-stroke (transient ischemic attack, TIA). Genetic factors, such as race, ethnicity, or a family history of stroke. Smoking cigarettes or using other tobacco products. Taking birth control pills, especially if you also use tobacco. Heavy use of alcohol or drugs, especially cocaine and methamphetamine. Physical inactivity. What actions can I take to prevent this? Manage your health conditions High cholesterol levels. Eating a healthy diet is important for preventing high cholesterol. If cholesterol cannot be managed through  diet alone, you may need to take medicines. Take any prescribed medicines to control your cholesterol as told by your health care provider. Hypertension. To reduce your risk of stroke, try to keep your blood pressure below 130/80. Eating a healthy diet and exercising regularly are important for controlling blood pressure. If these steps are not enough to manage your blood pressure, you may need to take medicines. Take any prescribed medicines to control hypertension as told by your health care provider. Ask your health care provider if you should monitor your blood pressure at home. Have your blood pressure checked every year, even if your blood pressure is normal. Blood pressure increases with age and some medical conditions. Diabetes. Eating a healthy diet and exercising regularly are important parts of managing your blood sugar (glucose). If your blood sugar cannot be managed through diet and exercise, you may need to take medicines. Take any prescribed medicines to control your diabetes as told by your health care provider. Get evaluated for obstructive sleep apnea. Talk to your health care provider about getting a sleep evaluation if you snore a lot or have excessive sleepiness. Make sure that any other medical conditions you have, such as atrial fibrillation or atherosclerosis, are managed. Nutrition Follow instructions from your health care provider about what to eat or drink to help manage your health condition. These instructions may include: Reducing your daily calorie intake. Limiting how much salt (sodium) you use to 1,500 milligrams (mg) each day. Using only healthy fats for cooking, such as olive oil, canola oil, or sunflower oil. Eating healthy foods. You can do this by: Choosing foods that are high in fiber, such as whole grains, and fresh fruits and vegetables. Eating at least 5 servings of fruits and vegetables a day. Try to fill one-half of your plate with fruits and vegetables  at each meal. Choosing lean protein foods, such as lean cuts of meat, poultry without skin, fish, tofu, beans, and nuts. Eating low-fat dairy products. Avoiding foods that are high in sodium. This can help lower blood pressure. Avoiding foods that have saturated fat, trans fat, and cholesterol. This can help prevent high cholesterol. Avoiding processed and prepared foods. Counting your daily carbohydrate intake.  Lifestyle If you drink alcohol: Limit how much you have to: 0-1 drink a day for women who are not pregnant. 0-2 drinks a day for men. Know how much alcohol is in your drink. In the [place] one drink equals one 12 oz bottle of beer ( ), one 5 oz glass of wine ( ), or one 1 oz glass of hard liquor (44mL). Do not use any products that contain nicotine or tobacco. These products include cigarettes, chewing tobacco, and vaping devices, such as e-cigarettes. If you need help quitting, ask your health care provider. Avoid secondhand smoke. Do not use drugs. Activity  Try to stay at a healthy weight. Get at least 30 minutes of exercise on most days, such as: Fast walking. Biking. Swimming. Medicines Take over-the-counter and prescription medicines only as told by your health  care provider. Aspirin  or blood thinners (antiplatelets or anticoagulants) may be recommended to reduce your risk of forming blood clots that can lead to stroke. Avoid taking birth control pills. Talk to your health care provider about the risks of taking birth control pills if: You are over 23 years old. You smoke. You get very bad headaches. You have had a blood clot. Where to find more information American Stroke Association: www.strokeassociation.org Get help right away if: You or a loved one has any symptoms of a stroke. BE FAST is an easy way to remember the main warning signs of a stroke: B - Balance. Signs are dizziness, sudden trouble walking, or loss of balance. E - Eyes. Signs are trouble  seeing or a sudden change in vision. F - Face. Signs are sudden weakness or numbness of the face, or the face or eyelid drooping on one side. A - Arms. Signs are weakness or numbness in an arm. This happens suddenly and usually on one side of the body. S - Speech. Signs are sudden trouble speaking, slurred speech, or trouble understanding what people say. T - Time. Time to call emergency services. Write down what time symptoms started. You or a loved one has other signs of a stroke, such as: A sudden, severe headache with no known cause. Nausea or vomiting. Seizure. These symptoms may represent a serious problem that is an emergency. Do not wait to see if the symptoms will go away. Get medical help right away. Call your local emergency services (911 in the [place] Do not drive yourself to the hospital. Summary You can help to prevent a stroke by eating healthy, exercising, not smoking, limiting alcohol intake, and managing any medical conditions you may have. Do not use any products that contain nicotine or tobacco. These include cigarettes, chewing tobacco, and vaping devices, such as e-cigarettes. If you need help quitting, ask your health care provider. Remember BE FAST for warning signs of a stroke. Get help right away if you or a loved one has any of these signs. This information is not intended to replace advice given to you by your health care provider. Make sure you discuss any questions you have with your health care provider. Document Revised: 06/25/2022 Document Reviewed: 06/25/2022 Elsevier Patient Education  2024 [employer].

## 2024-08-27 NOTE — Telephone Encounter (Signed)
 Patient came to chk out and stated she would like for Dr.sethi to prescribe robaxin . She thinks he mentioned it but was unsure if he was ok prescribing it. Please advise.

## 2024-08-27 NOTE — Telephone Encounter (Signed)
 Called pt back and she stated that she had mentioned in her appointment with Dr. Rosemarie that she was prescribe Robaxin  by another provider for her shoulder pain, pt stated that Dr. Rosemarie said that her pain was probably due to her neck. Pt is wanting to know if he will prescribe her Robaxin  again, told pt I didn't see any mention of that in Dr. Bucky notes, will send phone call to MD for him to make final decision.

## 2024-08-28 ENCOUNTER — Encounter: Payer: Self-pay | Admitting: Neurology

## 2024-08-28 LAB — LIPID PANEL
Chol/HDL Ratio: 3.9 ratio (ref 0.0–4.4)
Cholesterol, Total: 137 mg/dL (ref 100–199)
HDL: 35 mg/dL — ABNORMAL LOW
LDL Chol Calc (NIH): 87 mg/dL (ref 0–99)
Triglycerides: 73 mg/dL (ref 0–149)
VLDL Cholesterol Cal: 15 mg/dL (ref 5–40)

## 2024-08-28 LAB — HEMOGLOBIN A1C
Est. average glucose Bld gHb Est-mCnc: 151 mg/dL
Hgb A1c MFr Bld: 6.9 % — ABNORMAL HIGH (ref 4.8–5.6)

## 2024-08-29 ENCOUNTER — Ambulatory Visit: Payer: Self-pay | Admitting: Neurology

## 2024-08-29 ENCOUNTER — Other Ambulatory Visit: Payer: Self-pay | Admitting: Neurology

## 2024-08-29 MED ORDER — EZETIMIBE 10 MG PO TABS
10.0000 mg | ORAL_TABLET | Freq: Every day | ORAL | 3 refills | Status: AC
  Filled 2024-08-29: qty 30, 30d supply, fill #0

## 2024-08-30 ENCOUNTER — Other Ambulatory Visit: Payer: Self-pay

## 2024-09-03 ENCOUNTER — Other Ambulatory Visit (HOSPITAL_COMMUNITY): Payer: Self-pay

## 2024-09-03 NOTE — Telephone Encounter (Signed)
 I reviewed the note and did not see any mention of Dr. Rosemarie prescribing Robaxin , will defer to him on his return. Patient can also follow up with Dr. [place] who was the last person who prescribed Robaxin .

## 2024-09-08 ENCOUNTER — Other Ambulatory Visit (HOSPITAL_COMMUNITY): Payer: Self-pay

## 2024-09-08 ENCOUNTER — Telehealth: Payer: Self-pay | Admitting: Neurology

## 2024-09-08 ENCOUNTER — Telehealth: Payer: Self-pay

## 2024-09-08 ENCOUNTER — Ambulatory Visit
Admission: RE | Admit: 2024-09-08 | Discharge: 2024-09-08 | Disposition: A | Source: Ambulatory Visit | Attending: Neurology | Admitting: Neurology

## 2024-09-08 DIAGNOSIS — R519 Headache, unspecified: Secondary | ICD-10-CM

## 2024-09-08 MED ORDER — GADOPICLENOL 0.5 MMOL/ML IV SOLN
9.0000 mL | Freq: Once | INTRAVENOUS | Status: AC | PRN
  Administered 2024-09-08: 9 mL via INTRAVENOUS

## 2024-09-08 NOTE — Telephone Encounter (Signed)
 Pharmacy Patient Advocate Encounter   Received notification from Pt Calls Messages that prior authorization for Nurtec 75MG  dispersible tablets is required/requested.   Insurance verification completed.   The patient is insured through [employer] [facility] MEDICAID.   Per test claim: PA required; PA submitted to above mentioned insurance via Latent Key/confirmation #/EOC [id number] Status is pending

## 2024-09-08 NOTE — Telephone Encounter (Signed)
 Cindy  from [facility]  called to follow up about PA for  Pt medication Rimegepant Sulfate  (NURTEC) 75 MG TBDP  Dorthea stated   they have not heard anything back   CoverMyMeds Key #[id number]  Callback number  [phone]

## 2024-09-09 NOTE — Telephone Encounter (Signed)
 Pharmacy Patient Advocate Encounter  Received notification from [facility] MEDICAID that Prior Authorization for Nurtec 75MG  dispersible tablets has been DENIED.  Full denial letter will be uploaded to the media tab. See denial reason below.  Here are the policy requirements your request did not meet:   Your doctor must tell us  the following:  - You didn't have more than 15 migraine days per month in the last 6 months - You will not be taking this drug with a strong CYP3A4 inhibitor (a type of drug that could increase the amount of the drug asked for in your body to an unsafe level. Ask your doctor if you take one of these drugs) - You do not have end-stage renal disease (This is when your creatinine clearance (CrCL) less than 15 mL/min, this is a measurement which tells your doctor how well your kidneys are working)  PA #/Case ID/Reference #: [id number]

## 2024-09-09 NOTE — Telephone Encounter (Signed)
 I called pt and I relayed that denial was given for her nurtec due to below.  She states she does have daily headaches which she feels are migraines. She was taking nurtec as needed. (Taking 1 every other day #15/ 30 day supply.  She is not on CYp3A4 inhibitors (please review med list- I did not see any), and her 06/2024 GFR est > 60.  She has not tried ubrelvy or any injectables (ajovy/emgality) has allergy to latex. 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

## 2024-09-10 ENCOUNTER — Telehealth (HOSPITAL_COMMUNITY): Payer: Self-pay

## 2024-09-10 ENCOUNTER — Other Ambulatory Visit (HOSPITAL_COMMUNITY): Payer: Self-pay

## 2024-09-10 MED ORDER — AIMOVIG 70 MG/ML ~~LOC~~ SOAJ
70.0000 mg | SUBCUTANEOUS | 11 refills | Status: AC
  Filled 2024-09-10: qty 1, 30d supply, fill #0

## 2024-09-10 NOTE — Telephone Encounter (Signed)
Meds ordered this encounter  Medications   Erenumab-aooe (AIMOVIG) 70 MG/ML SOAJ    Sig: Inject 70 mg into the skin every 30 (thirty) days.    Dispense:  1.12 mL    Refill:  11     

## 2024-09-10 NOTE — Telephone Encounter (Signed)
 I called and spoke to pt.  I relayed that sent her message on mychart about the aimovig  for her to look at.  Order was sent to [facility] pharmacy but she would like to go to [facility]. She will have them transfer it.  Will require a PA, so will go ahead and sent to PA team to do.  She will call back for any questions.

## 2024-09-11 ENCOUNTER — Other Ambulatory Visit (HOSPITAL_COMMUNITY): Payer: Self-pay

## 2024-09-11 NOTE — Telephone Encounter (Signed)
 This was transferred out to [facility] on 09/11/24. No prior shara will be done at this time.

## 2024-09-26 ENCOUNTER — Other Ambulatory Visit

## 2024-12-18 ENCOUNTER — Ambulatory Visit: Admitting: Family Medicine
